# Patient Record
Sex: Female | Born: 1935 | Race: Black or African American | Hispanic: No | Marital: Married | Smoking: Former smoker
Health system: Southern US, Community
[De-identification: ages and names within clinical notes are randomized; demographics above are authoritative.]

## PROBLEM LIST (undated history)

## (undated) DIAGNOSIS — I10 Essential (primary) hypertension: Secondary | ICD-10-CM

## (undated) DIAGNOSIS — I509 Heart failure, unspecified: Secondary | ICD-10-CM

## (undated) DIAGNOSIS — I639 Cerebral infarction, unspecified: Secondary | ICD-10-CM

## (undated) DIAGNOSIS — I2510 Atherosclerotic heart disease of native coronary artery without angina pectoris: Secondary | ICD-10-CM

## (undated) HISTORY — PX: ROTATOR CUFF REPAIR: SHX139

## (undated) HISTORY — PX: TOTAL HIP ARTHROPLASTY: SHX124

## (undated) HISTORY — PX: OTHER SURGICAL HISTORY: SHX169

---

## 2001-02-24 ENCOUNTER — Encounter (INDEPENDENT_AMBULATORY_CARE_PROVIDER_SITE_OTHER): Payer: Self-pay | Admitting: Specialist

## 2001-02-24 ENCOUNTER — Ambulatory Visit (HOSPITAL_COMMUNITY): Admission: RE | Admit: 2001-02-24 | Discharge: 2001-02-24 | Payer: Self-pay | Admitting: Gastroenterology

## 2001-07-11 ENCOUNTER — Inpatient Hospital Stay (HOSPITAL_COMMUNITY): Admission: EM | Admit: 2001-07-11 | Discharge: 2001-07-13 | Payer: Self-pay

## 2001-09-08 ENCOUNTER — Ambulatory Visit (HOSPITAL_COMMUNITY): Admission: RE | Admit: 2001-09-08 | Discharge: 2001-09-08 | Payer: Self-pay | Admitting: Gastroenterology

## 2002-02-23 ENCOUNTER — Encounter: Payer: Self-pay | Admitting: Orthopedic Surgery

## 2002-02-23 ENCOUNTER — Encounter: Admission: RE | Admit: 2002-02-23 | Discharge: 2002-02-23 | Payer: Self-pay | Admitting: Orthopedic Surgery

## 2002-02-25 ENCOUNTER — Ambulatory Visit (HOSPITAL_BASED_OUTPATIENT_CLINIC_OR_DEPARTMENT_OTHER): Admission: RE | Admit: 2002-02-25 | Discharge: 2002-02-25 | Payer: Self-pay | Admitting: Orthopedic Surgery

## 2003-11-10 ENCOUNTER — Ambulatory Visit (HOSPITAL_COMMUNITY): Admission: RE | Admit: 2003-11-10 | Discharge: 2003-11-10 | Payer: Self-pay | Admitting: Neurology

## 2005-10-04 ENCOUNTER — Emergency Department (HOSPITAL_COMMUNITY): Admission: EM | Admit: 2005-10-04 | Discharge: 2005-10-04 | Payer: Self-pay | Admitting: Emergency Medicine

## 2005-11-08 ENCOUNTER — Encounter: Admission: RE | Admit: 2005-11-08 | Discharge: 2005-11-08 | Payer: Self-pay | Admitting: Neurology

## 2006-11-17 ENCOUNTER — Ambulatory Visit: Payer: Self-pay | Admitting: Cardiology

## 2006-11-24 ENCOUNTER — Encounter: Admission: RE | Admit: 2006-11-24 | Discharge: 2006-11-24 | Payer: Self-pay | Admitting: Internal Medicine

## 2006-11-24 ENCOUNTER — Ambulatory Visit: Payer: Self-pay

## 2006-11-30 ENCOUNTER — Ambulatory Visit: Payer: Self-pay | Admitting: Cardiology

## 2006-12-16 ENCOUNTER — Ambulatory Visit (HOSPITAL_COMMUNITY): Admission: RE | Admit: 2006-12-16 | Discharge: 2006-12-16 | Payer: Self-pay | Admitting: General Surgery

## 2007-01-03 ENCOUNTER — Inpatient Hospital Stay (HOSPITAL_COMMUNITY): Admission: EM | Admit: 2007-01-03 | Discharge: 2007-01-05 | Payer: Self-pay | Admitting: Emergency Medicine

## 2007-01-04 ENCOUNTER — Encounter (INDEPENDENT_AMBULATORY_CARE_PROVIDER_SITE_OTHER): Payer: Self-pay | Admitting: Interventional Cardiology

## 2007-01-04 ENCOUNTER — Ambulatory Visit: Payer: Self-pay | Admitting: *Deleted

## 2007-01-04 ENCOUNTER — Encounter (INDEPENDENT_AMBULATORY_CARE_PROVIDER_SITE_OTHER): Payer: Self-pay | Admitting: *Deleted

## 2007-03-24 ENCOUNTER — Encounter: Admission: RE | Admit: 2007-03-24 | Discharge: 2007-03-24 | Payer: Self-pay | Admitting: Interventional Radiology

## 2007-09-24 IMAGING — CR DG CHEST 2V
2 series · 2 of 2 positions shown · non-contrast
Comparison: 10/04/05.

CLINICAL DATA: Pre-op for umbilical hernia.
 CHEST - 2 VIEW:

[view not recorded (1 of 2)]
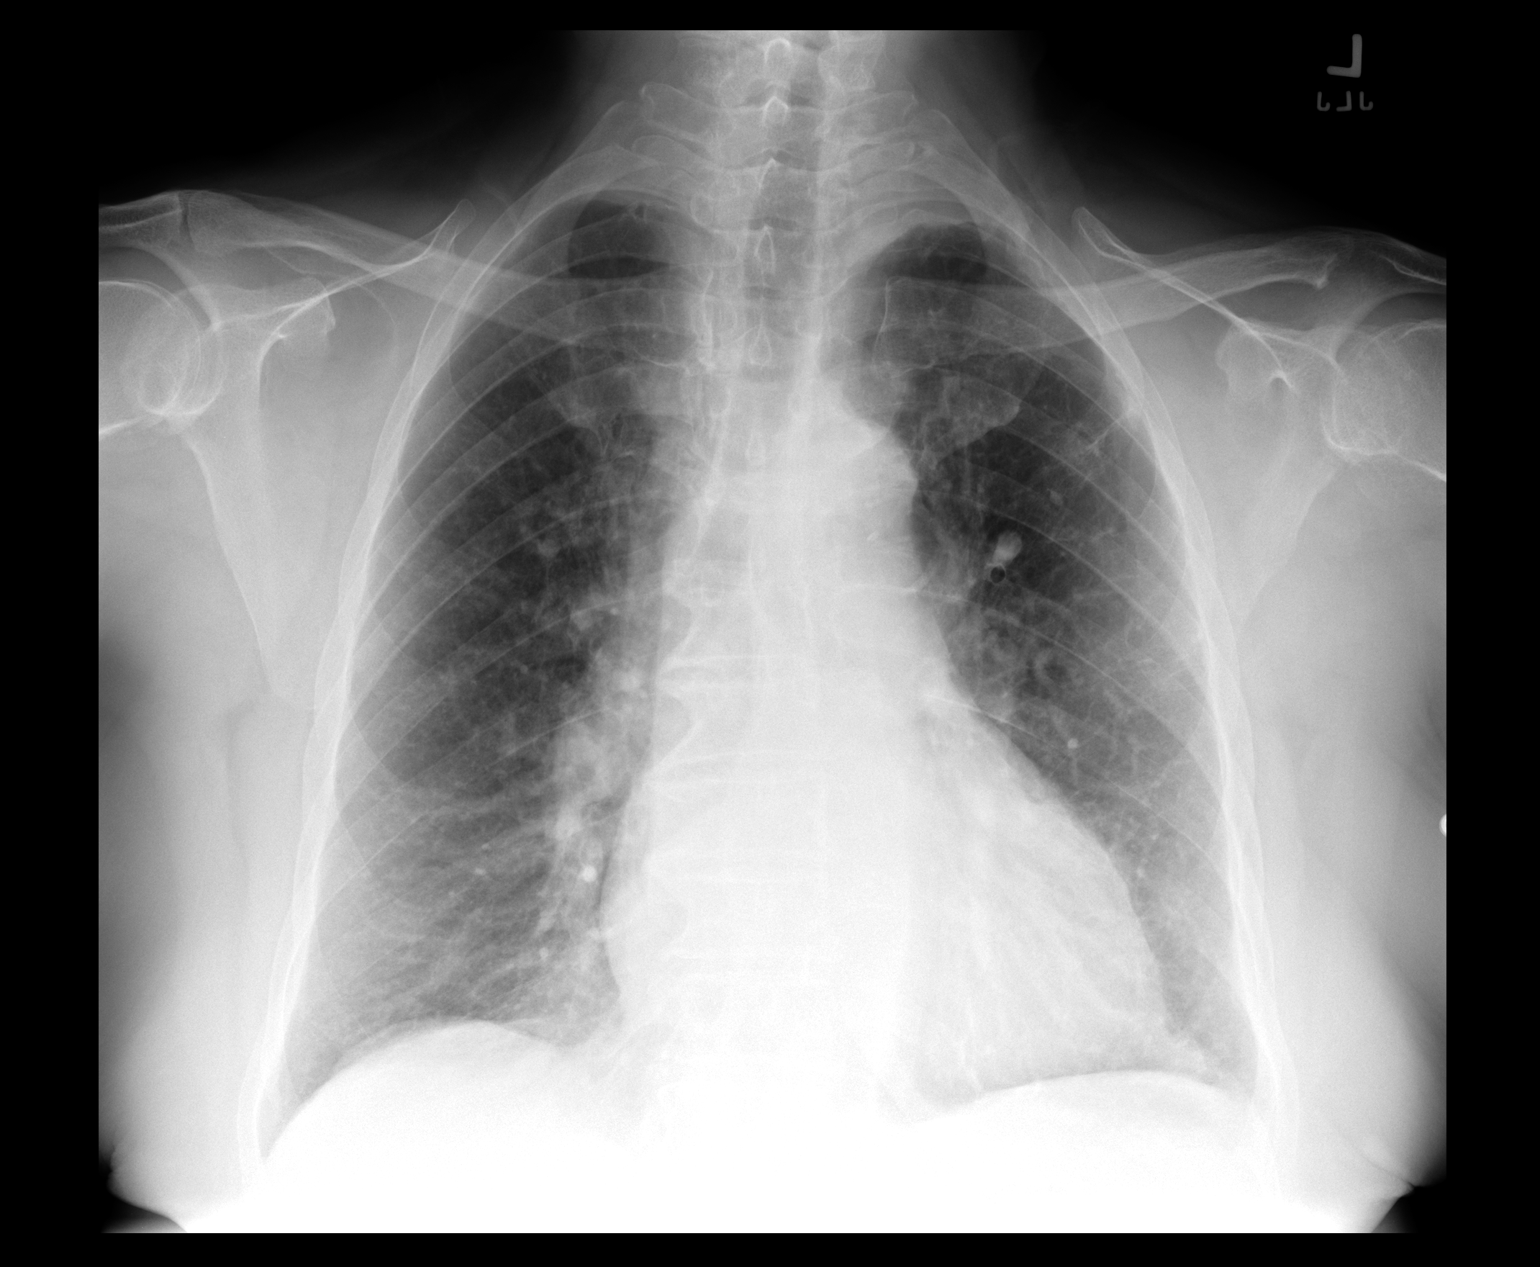

[view not recorded (2 of 2)]
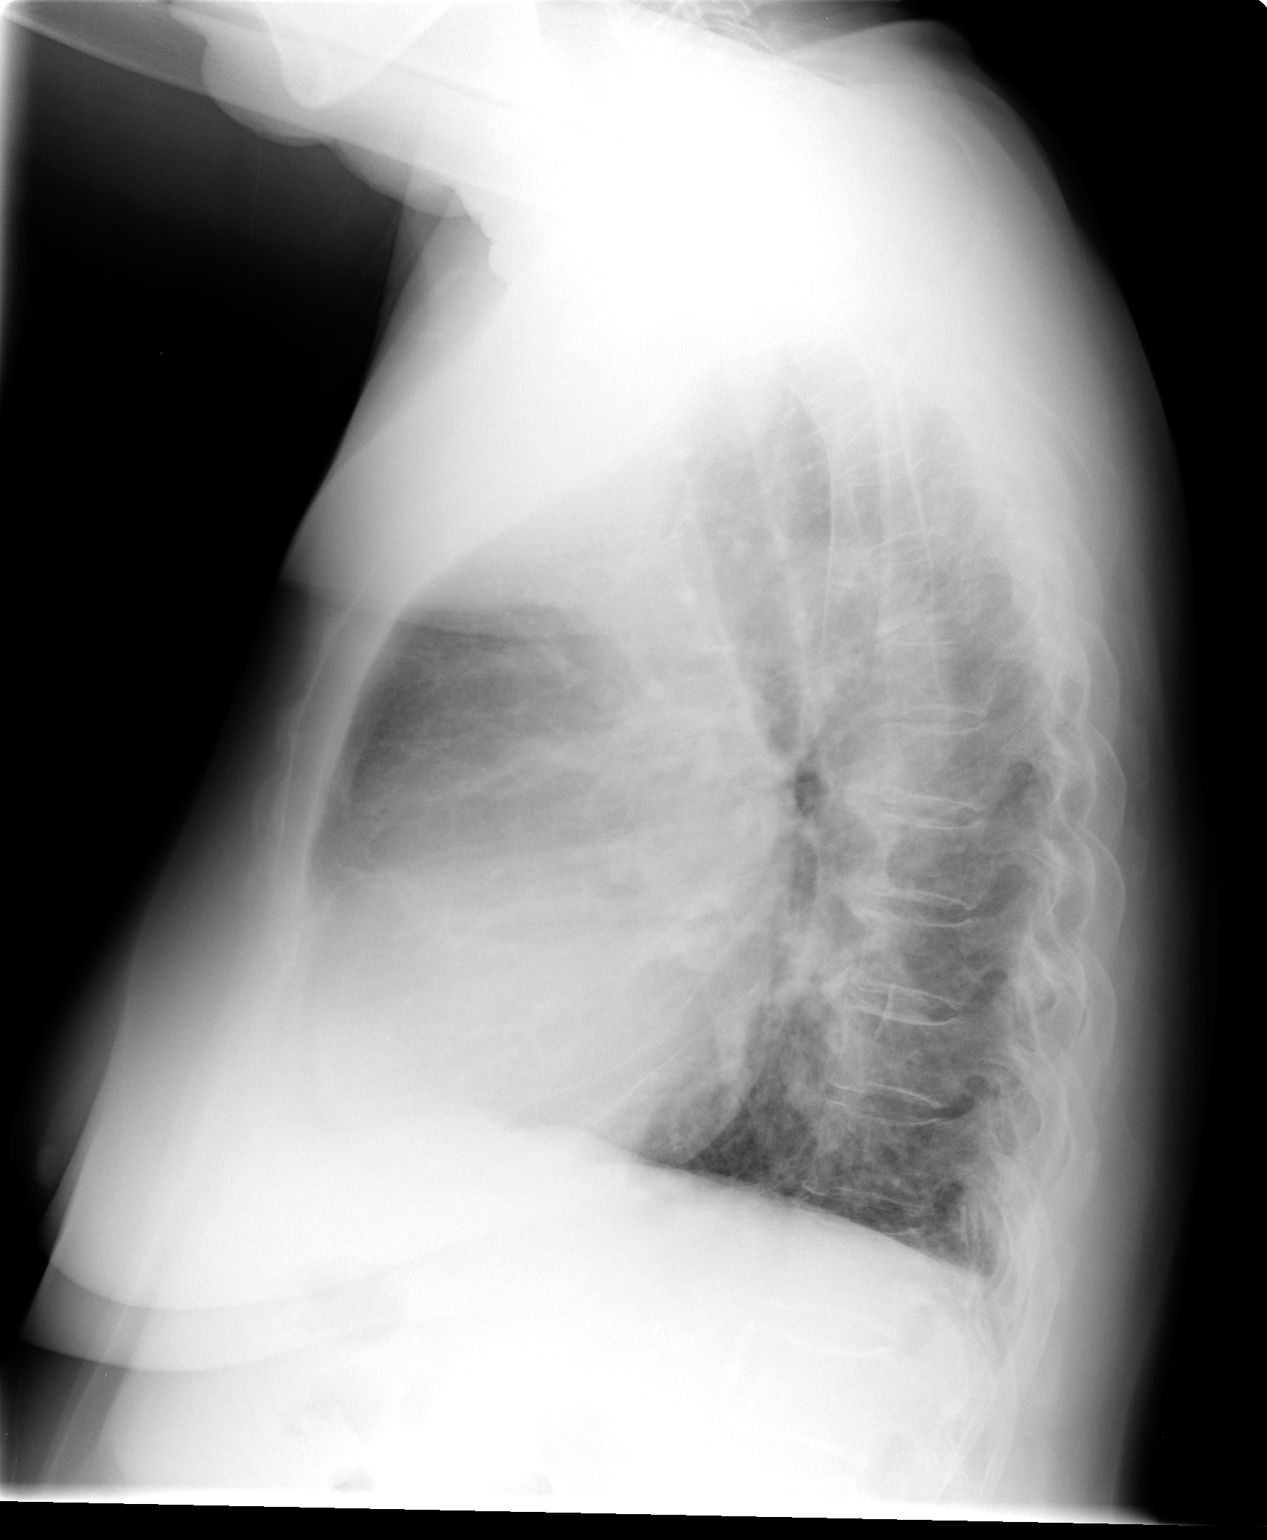

[2 of 2 positions shown; findings below may reference images not displayed]

FINDINGS: The heart is mildly enlarged without change.  There is no congestive heart failure.  There is COPD and peribronchial thickening.  No acute process.
 Osseous structures are intact with rather prominent degenerative spurring of the thoracic spine.
IMPRESSION: 1.  Mild cardiomegaly.
 2.    Peribronchial thickening and COPD.
 3.  No active disease.

## 2007-10-13 IMAGING — CR DG CHEST 2V
2 series · 2 of 2 positions shown · non-contrast
Comparison: none

HISTORY: Left arm numbness, stroke

CHEST 2 VIEWS:
Comparison 12/15/2006
Cardiac enlargement.
Stable mediastinal contours and pulmonary vascularity.
Question small hiatal hernia.
Chronic bronchitic and interstitial changes, stable.
No acute infiltrate, pleural effusion, or pneumothorax.

[w chest pa]
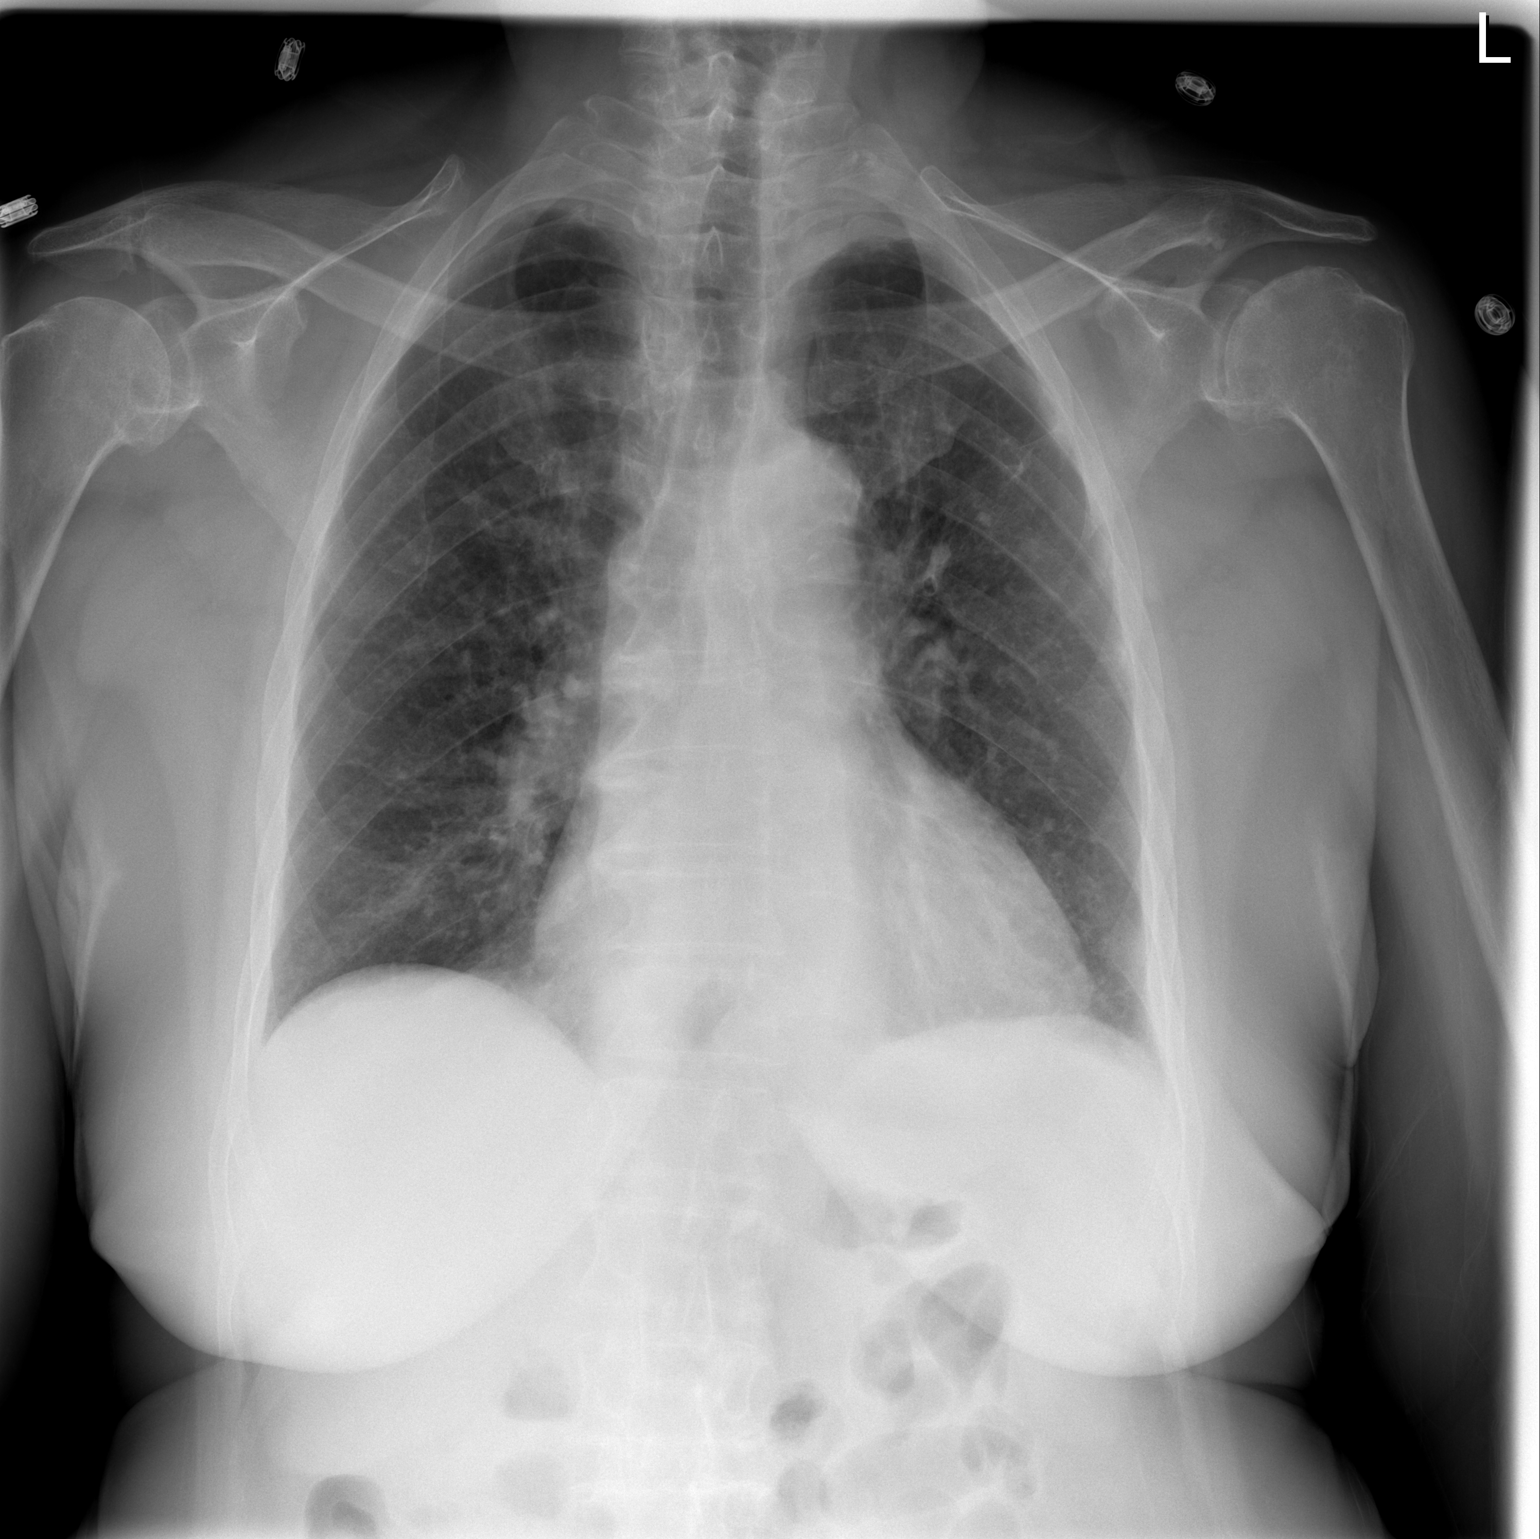

[w chest lat]
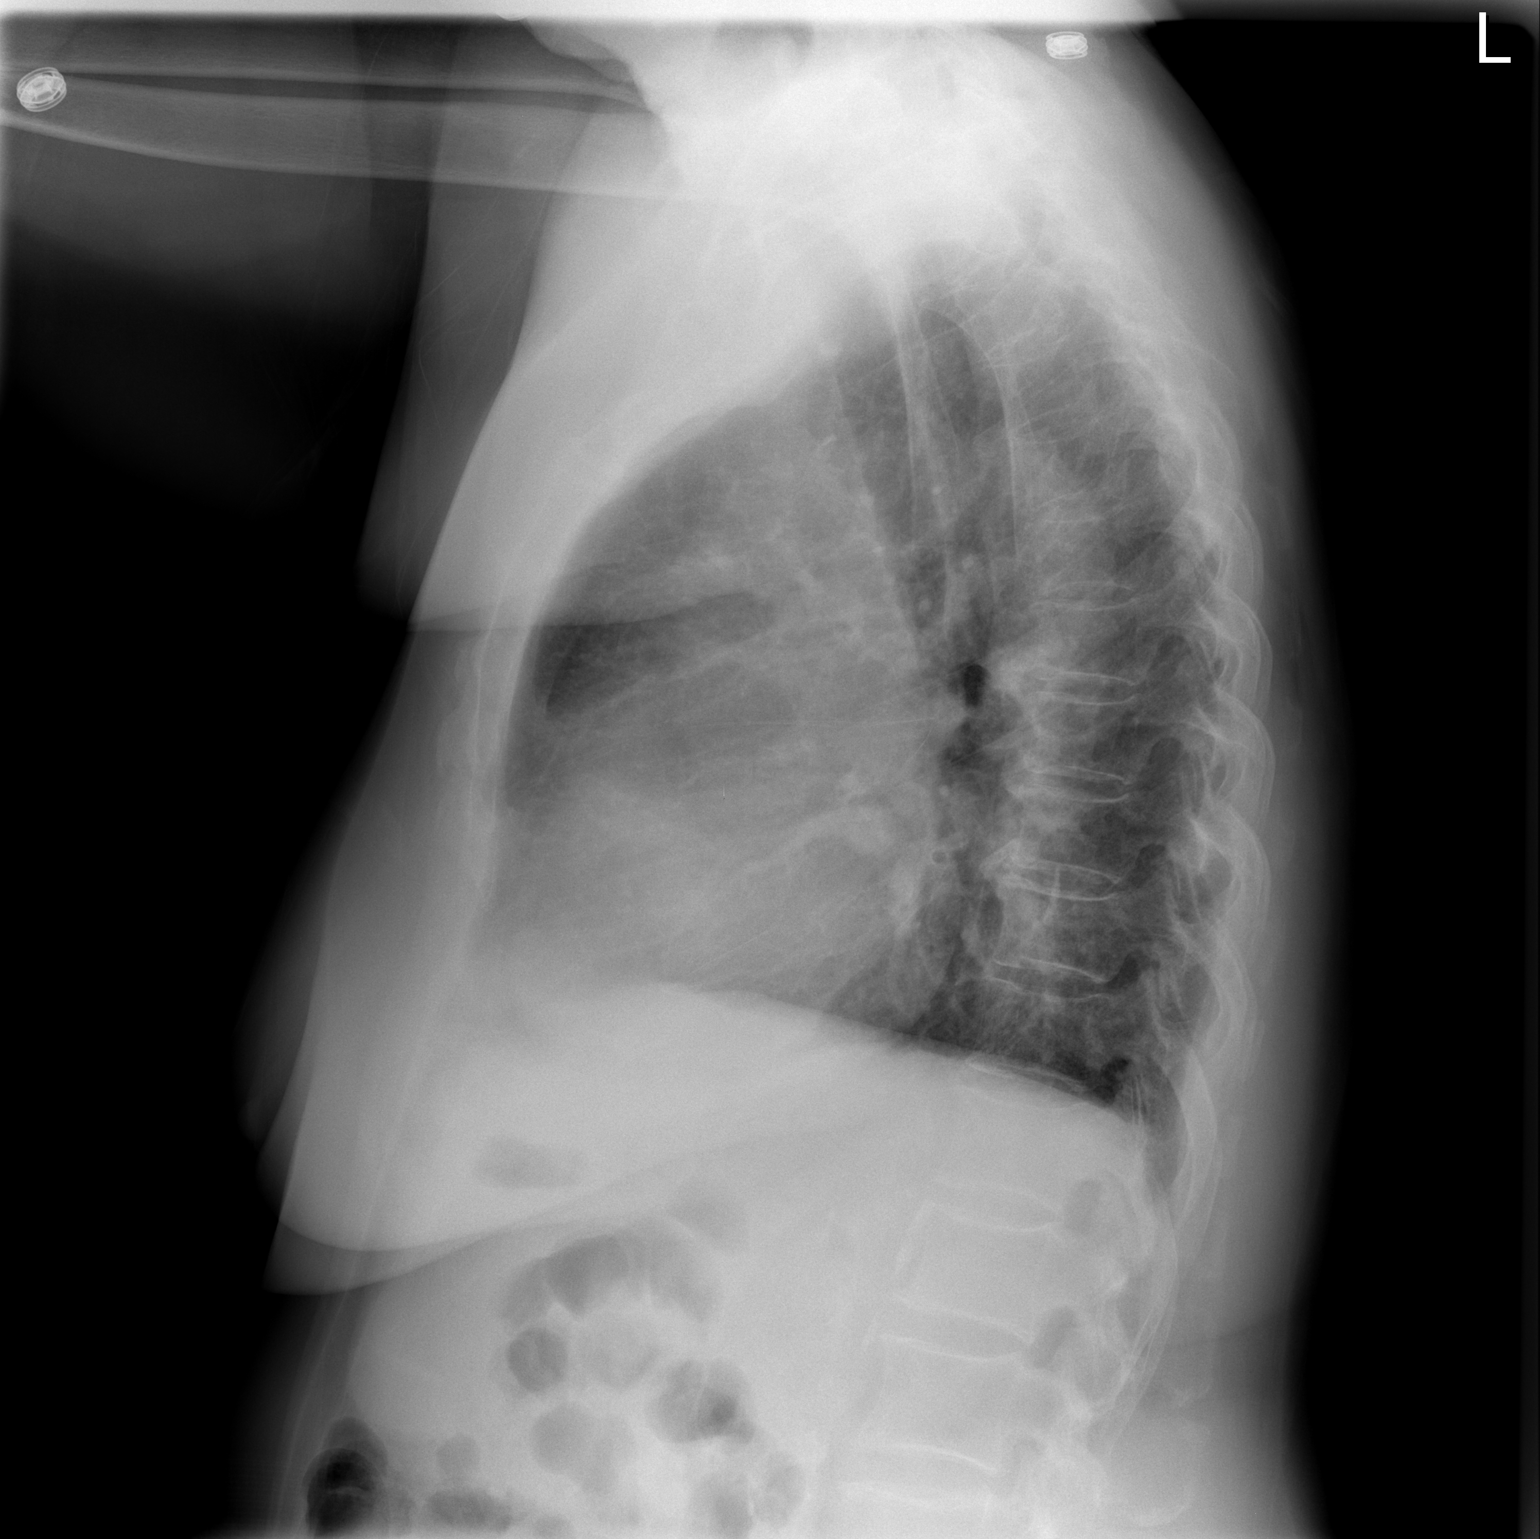

[2 of 2 positions shown; findings below may reference images not displayed]

IMPRESSION: Cardiomegaly.
Mild chronic bronchitic and interstitial changes.
No acute abnormality.
Question tiny hiatal hernia.

## 2008-06-19 ENCOUNTER — Ambulatory Visit: Payer: Self-pay | Admitting: Cardiology

## 2008-07-12 ENCOUNTER — Ambulatory Visit: Payer: Self-pay | Admitting: Cardiovascular Disease

## 2008-07-12 ENCOUNTER — Inpatient Hospital Stay (HOSPITAL_COMMUNITY): Admission: RE | Admit: 2008-07-12 | Discharge: 2008-07-20 | Payer: Self-pay | Admitting: Orthopedic Surgery

## 2008-07-14 ENCOUNTER — Encounter (INDEPENDENT_AMBULATORY_CARE_PROVIDER_SITE_OTHER): Payer: Self-pay | Admitting: Orthopedic Surgery

## 2009-04-30 ENCOUNTER — Inpatient Hospital Stay (HOSPITAL_COMMUNITY): Admission: RE | Admit: 2009-04-30 | Discharge: 2009-05-03 | Payer: Self-pay | Admitting: Orthopedic Surgery

## 2009-08-21 ENCOUNTER — Ambulatory Visit (HOSPITAL_COMMUNITY): Admission: RE | Admit: 2009-08-21 | Discharge: 2009-08-21 | Payer: Self-pay | Admitting: Orthopedic Surgery

## 2009-12-05 ENCOUNTER — Encounter: Admission: RE | Admit: 2009-12-05 | Discharge: 2009-12-05 | Payer: Self-pay | Admitting: Orthopedic Surgery

## 2010-03-07 ENCOUNTER — Ambulatory Visit (HOSPITAL_COMMUNITY): Admission: RE | Admit: 2010-03-07 | Discharge: 2010-03-07 | Payer: Self-pay | Admitting: Orthopedic Surgery

## 2010-07-30 ENCOUNTER — Emergency Department (HOSPITAL_COMMUNITY)
Admission: EM | Admit: 2010-07-30 | Discharge: 2010-07-30 | Payer: Self-pay | Source: Home / Self Care | Admitting: Emergency Medicine

## 2010-07-30 ENCOUNTER — Ambulatory Visit: Payer: Self-pay | Admitting: Vascular Surgery

## 2010-07-30 ENCOUNTER — Encounter (INDEPENDENT_AMBULATORY_CARE_PROVIDER_SITE_OTHER): Payer: Self-pay | Admitting: Emergency Medicine

## 2010-11-10 ENCOUNTER — Encounter: Payer: Self-pay | Admitting: Interventional Radiology

## 2011-01-01 LAB — POCT I-STAT, CHEM 8
BUN: 8 mg/dL (ref 6–23)
Calcium, Ion: 1.17 mmol/L (ref 1.12–1.32)
Creatinine, Ser: 0.8 mg/dL (ref 0.4–1.2)
TCO2: 28 mmol/L (ref 0–100)

## 2011-01-01 LAB — DIFFERENTIAL
Eosinophils Absolute: 0.3 10*3/uL (ref 0.0–0.7)
Lymphs Abs: 1.1 10*3/uL (ref 0.7–4.0)
Monocytes Absolute: 0.6 10*3/uL (ref 0.1–1.0)
Neutro Abs: 2.4 10*3/uL (ref 1.7–7.7)
Neutrophils Relative %: 54 % (ref 43–77)

## 2011-01-01 LAB — CBC
Hemoglobin: 11.7 g/dL — ABNORMAL LOW (ref 12.0–15.0)
MCHC: 31.5 g/dL (ref 30.0–36.0)
RDW: 13.3 % (ref 11.5–15.5)

## 2011-01-26 LAB — BASIC METABOLIC PANEL
BUN: 19 mg/dL (ref 6–23)
BUN: 9 mg/dL (ref 6–23)
CO2: 24 mEq/L (ref 19–32)
CO2: 28 mEq/L (ref 19–32)
Calcium: 8.6 mg/dL (ref 8.4–10.5)
Calcium: 9.2 mg/dL (ref 8.4–10.5)
Creatinine, Ser: 0.93 mg/dL (ref 0.4–1.2)
Creatinine, Ser: 0.96 mg/dL (ref 0.4–1.2)
GFR calc Af Amer: >60 mL/min (ref >60)
GFR calc Af Amer: >60 mL/min (ref >60)
Glucose, Bld: 108 mg/dL — ABNORMAL HIGH (ref 70–99)

## 2011-01-26 LAB — URINALYSIS, ROUTINE W REFLEX MICROSCOPIC
Bilirubin Urine: NEGATIVE
Hgb urine dipstick: NEGATIVE
Ketones, ur: NEGATIVE mg/dL
Nitrite: NEGATIVE
Urobilinogen, UA: 1 mg/dL (ref 0.0–1.0)

## 2011-01-26 LAB — GLUCOSE, CAPILLARY

## 2011-01-26 LAB — CBC
HCT: 26.9 % — ABNORMAL LOW (ref 36.0–46.0)
Hemoglobin: 9.2 g/dL — ABNORMAL LOW (ref 12.0–15.0)
Hemoglobin: 9.8 g/dL — ABNORMAL LOW (ref 12.0–15.0)
MCHC: 32.3 g/dL (ref 30.0–36.0)
MCHC: 33.6 g/dL (ref 30.0–36.0)
MCHC: 34 g/dL (ref 30.0–36.0)
MCHC: 34.2 g/dL (ref 30.0–36.0)
MCV: 88.7 fL (ref 78.0–100.0)
MCV: 90.7 fL (ref 78.0–100.0)
Platelets: 215 10*3/uL (ref 150–400)
Platelets: 218 10*3/uL (ref 150–400)
Platelets: 264 10*3/uL (ref 150–400)
RBC: 3.03 MIL/uL — ABNORMAL LOW (ref 3.87–5.11)
RBC: 3.55 MIL/uL — ABNORMAL LOW (ref 3.87–5.11)
RDW: 13.8 % (ref 11.5–15.5)
RDW: 13.8 % (ref 11.5–15.5)
RDW: 13.9 % (ref 11.5–15.5)
WBC: 8.3 10*3/uL (ref 4.0–10.5)

## 2011-01-26 LAB — PROTIME-INR
INR: 0.9 (ref 0.00–1.49)
INR: 1 (ref 0.00–1.49)
INR: 1.6 — ABNORMAL HIGH (ref 0.00–1.49)
Prothrombin Time: 13.3 seconds (ref 11.6–15.2)
Prothrombin Time: 20.2 seconds — ABNORMAL HIGH (ref 11.6–15.2)

## 2011-01-26 LAB — TYPE AND SCREEN
ABO/RH(D): A POS
Antibody Screen: NEGATIVE

## 2011-01-26 LAB — DIFFERENTIAL
Basophils Absolute: 0.1 10*3/uL (ref 0.0–0.1)
Basophils Relative: 2 % — ABNORMAL HIGH (ref 0–1)
Neutro Abs: 1.8 10*3/uL (ref 1.7–7.7)
Neutrophils Relative %: 45 % (ref 43–77)

## 2011-01-26 LAB — APTT: aPTT: 26 seconds (ref 24–37)

## 2011-01-26 LAB — URINE MICROSCOPIC-ADD ON

## 2011-03-04 NOTE — Consult Note (Signed)
NAMEJENY, NIELD NO.:  [id number]   MEDICAL RECORD NO.:  [id number]          PATIENT TYPE:  INP   LOCATION:  5033                         FACILITY:  [facility]   PHYSICIAN:  Verne Carrow, MDDATE OF BIRTH:  [birth year]   DATE OF CONSULTATION:  DATE OF DISCHARGE:                                 CONSULTATION   PRIMARY CARDIOLOGIST:  Rollene Rotunda, MD, [facility]   PRIMARY CARE Mikeila Burgen:  Gabriel Earing, MD   REQUESTING PHYSICIAN:  Feliberto Gottron. Turner Daniels, MD   PATIENT PROFILE:  A 77 year old African American female with prior  history of nonobstructive CAD, normal LV function, and normal Myoview in  February 2008, was status post left total hip arthroplasty and  complained of shortness of breath.   PROBLEMS:  1. Nonobstructive coronary artery disease.      a.     July 13, 2001, cardiac catheterization left main 20%.       LAD 50-60% proximal.  D1 20%.  Ramus intermedius 20%.  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  RCA dominant with the 20-30% proximal and mid       stenosis.      b.     November 24, 2006 adenosine Myoview EF 63%, no ischemia or       infarct.      c.     January 04, 2007, 2-D echocardiogram EF 55-60%, mild MR.  2. Right brain transient ischemic attack, March 2008.  3. Hypertension.  4. Hyperlipidemia.  5. Osteoarthritis/degenerative joint disease.      a.     Status post shoulder surgery, May 2003.      b.     Status post left total hip arthroplasty, July 12, 2008.  6. Gastroesophageal reflux disease.  7. Remote tobacco abuse.  8. Status post incarcerated umbilical hernia repair, March 2008.   HISTORY OF PRESENT ILLNESS:  A 77 year old [place] American female with  history of nonobstructive CAD and history as outlined above.  She was  admitted to [facility] yesterday for left total hip arthroplasty  following a prolonged history of left hip pain recalcitrant to  conservative  therapy.  By RN report, the patient was mildly tachycardiac  by pulse oximetry last night with normal O2 saturations, though the only  documented heart rates in the chart are in the 80s.  The patient  apparently complained of shortness of breath this morning and for that  reason we are consulted.  However, she now firmly denies ever being or  complaining of shortness of breath.  She also denies any chest pain.  She is very groggy and frequently nodding off to sleep saying that she  did not sleep at all or sleep well last night and now just tired.  Her  ECG now shows sinus rhythm without acute changes and despite her now  denying dyspnea, her chest x-ray shows increasing vascular congestion  and pulmonary edema.   ALLERGIES:  TETRACYCLINE.   CURRENT MEDICATIONS:  1. Ancef 1 g IV q.6  h.  2. Colace 100 mg b.i.d.  3. Lovenox 40 mg subcu b.i.d.  4. Lasix 40 mg daily.  5. Cozaar 50 mg daily.  6. Lopressor 25 mg b.i.d.  7. Senna 1 tab b.i.d.  8. Simvastatin 40 mg daily.  9. Coumadin as directed.   FAMILY HISTORY:  No family history of early CAD.   SOCIAL HISTORY:  She lives in [place] with her husband.  She is  retired.  She has one grown son.  She has a history of remote tobacco  abuse, but quit in 1985.  She denies alcohol or drug and is not  exercising.   REVIEW OF SYSTEMS:  She had a low grade fever this morning at 99.23.  She is very tired and groggy reporting that she did not sleep all last  night.  She complained of shortness of breath to the Orthopedic Team  this morning, although currently denies this.  She has left leg/hip  pain.  She has a history of GERD.  Otherwise all systems reviewed  negative.   PHYSICAL EXAMINATION:  VITAL SIGNS:  Temperature 99.3, heart rate 80,  respirations 18, blood pressure 125/64, pulse ox 94% on 2 liters.  GENERAL:  Pleasant African American female in no acute distress, awake,  alert, and oriented x3, although she is frequently dosing  off.  HEENT:  Normal.  NEURO:  Grossly intact, nonfocal.  SKIN:  Warm and dry with dressing to the left hip that is dry and  intact.  NECK:  JVP of about 8 cm, no bruits.  LUNGS:  Respirations are unlabored.  Diminished breath sounds, although  she is not taking deep breath.  CARDIAC:  Regular S1 and S2, no S3, S4, murmurs.  ABDOMEN:  Round, soft, nontender, nondistended.  Bowel sounds present.  EXTREMITIES:  Warm, dry, no clubbing, cyanosis.  There is trace  bilateral lower extremity edema.  Distal pulses are 2+ and equal  bilaterally.   Chest x-ray from July 05, 2008, shows cardiomegaly with pulmonary  venous hypertension.  COPD.  Old granulomatous disease.  Chest x-ray  today shows increased vascular congestion, clubbing, edema.  EKG shows  sinus rhythm with the normal axis rate of 81 beats per minute, no acute  ST or T changes.  Hemoglobin 10.2, hematocrit 30.8, WBC 0.8, platelets  of 238, sodium 137, potassium 2.6, chloride 106, CO2 23, BUN 8,  creatinine 0.82, glucose 123, and calcium 8.5.   ASSESSMENT:  1. Dyspnea.  The patient now denies any history of dyspnea, although      it is documented in her chart that she complained of this earlier.      Notably, she was on a Dilaudid PCA earlier and may have just      forgotten.  Regardless for ECG shows no acute changes while her      chest x-ray does show pulmonary edema.  We will give one dose of IV      Lasix of 40 mg now.  I would plan to continue her home dose of p.o.      Lasix.  Follow-up 2-D echocardiogram while she is in the hospital      and also check if cardiac markers.  Her heart rate nad blood      pressure are currently well controlled..  2. History of nonobstructive coronary artery disease.  No chest pain.      Dyspnea as above.  Continue home meds, which include beta-blocker,      ARB, and  statin therapy.  3. Hypertension, stable.  4. Hyperlipidemia, continue statin.  5. Degenerative joint disease, status  post left total hip arthroplasty      per Ortho.  6. Anemia.  Follow.  Question of this may contribute to dyspnea.      Nicolasa Ducking, ANP      Verne Carrow, MD  Electronically Signed    CB/MEDQ  D:  07/13/2008  T:  07/14/2008  Job:  [id number]

## 2011-03-04 NOTE — Op Note (Signed)
[name], [name] NO.:  [id number]   MEDICAL RECORD NO.:  [id number]          PATIENT TYPE:  INP   LOCATION:  5003                         FACILITY:  [facility]   PHYSICIAN:  Feliberto Gottron. Turner Daniels, M.D.   DATE OF BIRTH:  09/06/1933   DATE OF PROCEDURE:  04/30/2009  DATE OF DISCHARGE:                               OPERATIVE REPORT   PREOPERATIVE DIAGNOSIS:  End-stage arthritis, right hip.   POSTOPERATIVE DIAGNOSIS:  End-stage arthritis, right hip.   PROCEDURE:  Hybrid right total hip arthroplasty using DePuy 54-mm  pinnacle cup, 36-mm metal liner, NK +1.5, 36 ultimate head, #3 Summit  basic stem, 13-mm tip, #4 cement restrictor, double batch of DePuy HV  cement with 1500 mg of Zinacef.   SURGEON:  Feliberto Gottron. Turner Daniels, MD   FIRST ASSISTANT:  Shirl Harris, PA-C   ANESTHETIC:  General endotracheal.   ESTIMATED BLOOD LOSS:  300 mL   FLUID REPLACEMENT:  1500 mL of crystalloid.   DRAINS PLACED:  None.   TOURNIQUET TIME:  None.   INDICATIONS FOR PROCEDURE:  A 77 year old woman with end-stage arthritis  of the right hip had a successful hybrid left total hip last year and  desires same for the right side.  Risks and benefits of surgery are well  known to her and she has failed conservative measures.   DESCRIPTION OF PROCEDURE:  The patient was identified by armband and  received 2 g of Ancef preoperatively IV in the holding area, taken to  the operating room #4.  Appropriate anesthetic monitors were attached  and general endotracheal anesthesia was induced with the patient supine  in the position.  Foley catheter was inserted, rolled into the left  lateral decubitus position, fixed her with Trecia Rogers II pelvic clamp  and the right lower extremity was prepped and draped in the usual  sterile fashion from the ankle to the hemipelvis.  Skin along the  lateral hip and thigh was infiltrated with 20 mL of 0.5% Marcaine and  epinephrine solution, and a standard  time-out procedure was then  performed.  We began the operation itself by making a 20-cm incision  centered over the greater trochanter allowing a posterolateral approach  to the hip joint.  Small bleeders in the skin and subcutaneous tissue  were identified and cauterized.  IT band was cut in line with skin  incision exposing the greater trochanter.  Cobra retractor was placed  between the gluteus minimus and superior hip joint capsule and between  the quadratus femoris and the inferior hip joint capsule isolating the  short external rotators and piriformis which were tagged with #2  Ethibond suture and cut off their insertion on intertrochanteric crest  exposing the posterior aspect of the hip joint capsule which was  developed into an acetabular-based flap starting almost straight  superior off the acetabulum going out over the femoral neck down and  exiting posterior and inferior and the flap was tagged with two #2  Ethibond sutures.  This allowed [place] to dislocate the femoral head.  Standard neck cut was performed 1 fingerbreadth above the lesser  trochanter with an oscillating saw and the proximal femur was then  translated anteriorly with a Homan retractor levering off the anterior  column.  A large wing retractor was placed at the junction of the  ischium and the acetabulum and finally a spike Cobra in the cotyloid  notch.  This allowed [place] to remove the labrum and also calcified labrum,  getting [place] back to the true acetabulum which was then sequentially  reamed up to a 53-mm basket reamer obtaining good coverage in all  quadrants and the edge was lightly touched with a 54-mm reamer.  The  acetabulum was then irrigated out with normal saline solution and a 54-  mm pinnacle cup was then hammered into place in 45 degrees of abduction  and about 15-20 degrees of anteversion and the central occluder was  placed followed by the 36-mm metal liner.  The hip was then flexed  internally and  rotated exposing the proximal femur which was entered  with the initiator drill followed by the lateral reamer and followed by  the tapered reamers up to a 4.5 tapered reamer and broaches up to a #3  Summit basic broach which seated at the level of the neck cut and no  further neck cut was required.  A trial was then assembled with an NK  +1.5 neck and a 36 trial ball.  The hip was then relocated, stability to  90 flexion and 75 internal rotation was noted into the full extension  and external rotation, though could not be dislocated and the shuck test  was negative.  At this point, the trial components were removed.  We  sized for a #4 cement restrictor which was placed at the appropriate  depth.  The proximal femur was pulse lavaged out with normal saline  solution, dried with suction and sponges.  A double batch of DePuy HV  cement with 1500 mg of Zinacef was mixed at the back table and injected  into the femoral canal under pressure followed by a #3 Summit basic stem  with a 13-mm tip in about 15-20 degrees of anteversion.  This was held  in place as the cement cured and then an NK +1.5 mm, 36-mm ball was  hammered onto the stem.  The hip was reduced.  Stability was checked and  found to be excellent.  The short external rotators and capsular flap  were repaired back to the intertrochanteric crest through drill holes  with a #2 Ethibond suture.  The wound was irrigated out with normal  saline solution and pulse lavage.  The IT band was closed with running  #1 Vicryl suture, subcutaneous tissue with 0 and 2-0 undyed Vicryl  suture, and the skin with skin staples.  A dressing of Xeroform and  Mepilex was then applied.  The patient was unclamped, rolled supine,  awakened, and taken to the recovery room without difficulty.      Feliberto Gottron. Turner Daniels, M.D.  Electronically Signed     FJR/MEDQ  D:  04/30/2009  T:  04/30/2009  Job:  [id number]

## 2011-03-04 NOTE — Discharge Summary (Signed)
[name], [name] NO.:  [id number]   MEDICAL RECORD NO.:  [id number]          PATIENT TYPE:  INP   LOCATION:  5003                         FACILITY:  [facility]   PHYSICIAN:  Feliberto Gottron. Turner Daniels, M.D.   DATE OF BIRTH:  09/18/1933   DATE OF ADMISSION:  04/30/2009  DATE OF DISCHARGE:  05/03/2009                               DISCHARGE SUMMARY   CHIEF COMPLAINT:  Right hip pain.   HISTORY OF PRESENT ILLNESS:  This is a 77 year old lady who complains of  severe unremitting pain in her right hip despite conservative treatment  with antiinflammatories, narcotic pain medicines, and activity  modification.  She has a history of a successful total hip arthroplasty  on the left side in 2009.  She desires similar surgical intervention on  the contralateral side.  All risks and benefits of surgery were  discussed with the patient.   PAST MEDICAL HISTORY:  Significant for cerebrovascular accident,  coronary artery disease, and osteoarthritis.   PAST SURGICAL HISTORY:  She had a left total hip arthroplasty in 2009,  had umbilical hernia repair, and shoulder arthroscopy.   SOCIAL HISTORY:  She denies the use of alcohol or tobacco.   FAMILY HISTORY:  Significant for coronary artery disease, hypertension,  diabetes, and gout.   CURRENT MEDICATIONS:  1. Aggrenox 200/25 mg 1 p.o. b.i.d.  2. Metoprolol 25 mg 1 p.o. b.i.d.  3. Furosemide 40 mg 1 p.o. b.i.d.  4. Simvastatin 40 mg 1 p.o. daily.  5. Losartan 50 mg 1 p.o. daily.  6. Hydrocodone 5/500 mg 1 p.o. b.i.d. p.r.n. pain.   ALLERGIES:  She has allergy to TETRACYCLINES.   PHYSICAL EXAMINATION:  Gross examination of the right hip demonstrates  the patient to have significant pain with attempted internal rotation.  She has a negative foot tap, and she is neurovascularly intact.  She  ambulates with an antalgic gait.   X-rays demonstrate bone-on-bone degenerative joint disease of the right  hip.   PREOPERATIVE LABORATORY  DATA:  White blood cells 4.1, red blood cells  3.98, hemoglobin 12.1, hematocrit 35.6, platelets 264.  PT 12.5, INR is  0.9, PTT 26.  Sodium 141, potassium 4.3, chloride 107, glucose 108, BUN  19, creatinine 0.96.  Urinalysis positive for trace leukocytes,  otherwise within normal limits.   HOSPITAL COURSE:  [name] was admitted to [facility] on April 30, 2009, when she underwent a right total hip arthroplasty.  The procedure  was performed by Dr. Gean Birchwood, and the patient tolerated it well.  A  perioperative Foley catheter was placed.  She was transferred from  recovery to the orthopedic floor and placed on Lovenox and Coumadin for  DVT prophylaxis.  On the first postoperative day, the patient was awake  and alert and reported that her hip pain was well controlled with her  PCA.  She denied any nausea or vomiting and was tolerating p.o. intake  well.  Surgical dressing was clean.  Hemoglobin was 10.4.  She was  evaluated by physical therapy and ambulated approximately 30 feet.  On  the second postoperative day,  the patient was eating well.  Her pain was  well controlled with oral medication.  Surgical dressing was one-fourth  soaked and her incision was benign.  She ambulated 60 feet with physical  therapy.  On the third postoperative day, the patient was awake, alert,  eating well, and progressing well with physical therapy.  Hemoglobin was  9.2.  There was a small amount of drainage on her surgical dressing and  this was changed.  She was discharged home.   DISPOSITION:  The patient was discharged home on May 03, 2009.  She was  weightbearing as tolerated and would return to the clinic to see Dr.  Turner Daniels in 7-10 days for x-rays and suture removal.  [facility]  will manage her wound, Coumadin, and physical therapy.   Discharge medicines will be as per the HMR with the addition of Percocet  5 mg 1-2 tablets p.o. q.4 h. p.r.n. pain and Coumadin to take as  directed with  target INR of 1.5 to 2.   FINAL DIAGNOSIS:  End-stage degenerative joint disease of the right hip.      Shirl Harris, PA      Feliberto Gottron. Turner Daniels, M.D.  Electronically Signed    JW/MEDQ  D:  05/03/2009  T:  05/03/2009  Job:  [id number]

## 2011-03-04 NOTE — Discharge Summary (Signed)
Kristine, Mcclain NO.:  [id number]   MEDICAL RECORD NO.:  [id number]          PATIENT TYPE:  INP   LOCATION:  5023                         FACILITY:  [facility]   PHYSICIAN:  Feliberto Gottron. Turner Daniels, M.D.   DATE OF BIRTH:  Jun 21, 1933   DATE OF ADMISSION:  07/12/2008  DATE OF DISCHARGE:  07/20/2008                               DISCHARGE SUMMARY   CHIEF COMPLAINT:  Left hip pain.   HISTORY OF PRESENT ILLNESS:  This is a 77 year old lady who complains of  unremitting pain in her left hip who has failed conservative treatment  with NSAIDs, narcotics and steroid injections.  She desires a surgical  intervention at this time.  All risks and benefits of surgery were  discussed with the patient.   PAST MEDICAL HISTORY:  Significant for coronary artery disease,  hypertension, hyperlipidemia and gastroesophageal reflux disease.   PAST SURGICAL HISTORY:  She has had a hysterectomy and umbilical hernia  repair and cataract surgery.   SOCIAL HISTORY:  She quit smoking in 1985.  She does not drink alcohol.  She lives at home with her husband.   FAMILY HISTORY:  Noncontributory.   ALLERGIES:  She has a known drug allergy of TETRACYCLINE.   CURRENT MEDICATIONS:  1. NitroQuick 0.4 mg p.r.n. chest pain.  2. Simvastatin 40 mg one p.o. daily.  3. Aggrenox 20/25 mg one p.o. b.i.d.  4. Furosemide 40 mg one p.o. daily.  5. Acetaminophen 500 mg one p.o. q.6 hours p.r.n. pain.  6. Cozaar 50 mg one p.o. daily.  7. Hydrocodone 5/500 mg one to two tabs p.o. q.4 hours p.r.n. pain.  8. Metoprolol 25 mg one p.o. b.i.d.   PHYSICAL EXAMINATION:  Gross examination of the left hip demonstrates  the patient to have tenderness with internal rotation.  She was  neurovascularly intact.  She walks with an antalgic gait.  X-rays  demonstrate bone on bone degenerative joint disease of the left hip with  flattening of the femoral head.   Preop lab, white blood cells 4.1, red blood cells 3.98,  hemoglobin 11.8,  hematocrit 34.8, platelets 297, PT 13.0, INR 1.0, PTT 27.  Sodium 141,  potassium 4.4, chloride 109, glucose 100, BUN 10, creatinine 0.72.  Her  urinalysis was significant for trace leukocytes but was otherwise within  normal limits.   HOSPITAL COURSE:  [name] was admitted to [facility] on July 12, 2008, where she underwent a left total hip arthroplasty using the  DePuy system with a cemented stem.  Dr. Gean Birchwood performed the  procedure.  An intraoperative Foley catheter was placed.  She tolerated  the procedure well and was transferred to the orthopedic floor.  On the  first postoperative day the patient was awake but drowsy, complaining of  shortness of breath on 2 liters of oxygen via nasal cannula.  The nurse  reported intermittent episodes of tachycardia during the night.  Her  hemoglobin was 10.2.  Her surgical dressing was found to be clean and  dry.  Her cardiologist was contacted for evaluation of dyspnea.  On the  second postoperative  day the patient reported improvement in her  shortness of breath but she did have an episode of near syncope when she  was working with physical therapy.  Her hemoglobin on this day was 10.8.  Her surgical dressing was again clean and dry and her incision was found  to be benign.  The patient had a syncopal episode when she worked with  physical therapy again on the second postoperative day so the  cardiologist was consulted to evaluate episodes of syncope.  The  cardiologist felt the episodes were not likely ischemic but probably  more from episodes of orthostatic hypotension.  On the third  postoperative day the patient's hemoglobin was 9.9.  She was not able to  work well with physical therapy secondary to complaints of fatigue.  She  also continued to complain of hypotensive episodes when standing.  On  the fourth postoperative day the patient was moved to a telemetry bed so  that she could be monitored more  closely.  She continued to have  difficulty working with physical therapy.  Her hemoglobin on this day  was 9.6.  Her surgical incision remained benign.  On the fifth  postoperative day the patient reported minimal hip pain.  She was taking  p.o. well and denied any nausea or vomiting.  She continued to progress  very slowly with physical therapy and had another episode of what the  physical therapist described as syncope.  On the sixth postoperative day  the patient's pain medicine was reduced.  She was awake and alert and  denied any chest pain or shortness of breath.  Skilled nursing was  consulted for the patient to be rehabbed after her discharge from the  hospital.  On the seventh postoperative day the patient complained of  fatigue but she denied any episodes of syncope or near syncope the  previous day when she worked with physical therapy.  There was a small  amount of clear serous drainage on her surgical dressing so this was  changed.  She was able to ambulate 16 feet with physical therapy.  On  the eighth postoperative day the patient was awake, alert.  She denied  any nausea or vomiting.  She was eating well and reported that she had a  bowel movement the previous day.  She denied any episodes of syncope  over the last couple of days.  Her incision appeared benign so she was  discharged to a skilled nursing facility.   DISPOSITION:  The patient was discharged to a skilled nursing facility  on July 20, 2008.  The facility will manage her Percocet, Coumadin,  her other home medications, physical therapy and her surgical wound.  She will be weightbearing as tolerated with physical therapy and will  return to the clinic to see Dr. Turner Daniels in 1 week.  Medicines will be as  per the EMR with the addition of Percocet 5 mg tab 1-2 tabs p.o. q.4  hours p.r.n. pain and Coumadin 5 mg tablets take as directed with a  target INR of 1.5-2.  We have also advised her to continue following  up  with her cardiologist regarding her episodes of hypotension and near  syncope.   FINAL DIAGNOSIS:  End-stage degenerative joint disease of the left hip.      Shirl Harris, PA      Feliberto Gottron. Turner Daniels, M.D.  Electronically Signed    JW/MEDQ  D:  07/20/2008  T:  07/20/2008  Job:  [id number]

## 2011-03-04 NOTE — Op Note (Signed)
NAMELIBERTI, APPLETON NO.:  [id number]   MEDICAL RECORD NO.:  [id number]          PATIENT TYPE:  INP   LOCATION:  2899                         FACILITY:  [facility]   PHYSICIAN:  Feliberto Gottron. Turner Daniels, M.D.   DATE OF BIRTH:  11-Mar-1933   DATE OF PROCEDURE:  07/12/2008  DATE OF DISCHARGE:                               OPERATIVE REPORT   PREOPERATIVE DIAGNOSIS:  End-stage arthritis, left hip.   POSTOPERATIVE DIAGNOSIS:  End-stage arthritis, left hip.   PROCEDURE:  Hybrid left total hip arthroplasty using a 52-mm DePuy ASR  cup, NK +2 46-mm Ultimate ball #4 Summit basic stem of the 13 tip, and a  #4 cement restrictor double batch of DePuy HV cement with 1500 mg  Zinacef.   SURGEON:  Feliberto Gottron.  Turner Daniels, MD   FIRST ASSISTANT:  Shirl Harris, PA-C   ANESTHESIA:  General endotracheal.   ESTIMATED BLOOD LOSS:  400 mL.   FLUID REPLACEMENT:  1600 mL of crystalloid.   DRAINS PLACED:  Foley catheter.   URINE OUTPUT:  300 mL.   INDICATIONS FOR PROCEDURE:  A 77 year old woman with bone-on-bone  arthritic changes by x-ray and severe unremitting pain that prevents  ambulation more than about 20-30 feet before she must seek support,  wakes her up at night, and she has failed conservative treatment, anti-  inflammatory medicines, attempts at physical therapy, and judicious use  of narcotics.  In order to decrease pain and increase function, she  desires left total hip arthroplasty.  Risks and benefits of surgery  discussed with using a cemented stem to minimize blood loss and get her  up moving as quickly as possible in this 77 year old woman who is  physiologically 75.   DESCRIPTION OF PROCEDURE:  The patient was identified by armband and  received IV antibiotics preoperatively.  She was then taken to the  operating room 1.  The appropriate anesthetic site monitors were  attached and general endotracheal anesthesia induced with the patient in  supine position.  Foley catheter  was inserted.  She was then rolled into  the right lateral decubitus position and fixed there with a Stulberg  Mark II pelvic clamp to keep the pelvis vertical.  The left lower  extremity was then prepped and draped in the usual sterile fashion from  the ankle to the hemipelvis and the skin along the lateral hip and thigh  infiltrated with 20 mL with 0.5% Marcaine and epinephrine solution.  We  began the procedure itself by making a 20-cm incision centered over the  greater trochanter cutting through the skin and subcutaneous tissue down  to the level of the IT band, which was then cut in line with the skin  incision.  Small bleeders were again identified and cauterized.  A Cobra  retractor was placed between the gluteus minimus and the superior hip  joint capsule and between the quadratus femoris and the inferior joint  capsule.  This isolated short external rotators and piriformis which  were then tagged with a #2 Ethibond suture and cut off their insertion  on the intertrochanteric crest.  The  posterior aspect of the capsule was  then developed into an acetabular based flap and tagged with #2 Ethibond  sutures going from posterior-superior and the acetabulum to its  insertion on the femoral neck, down the femoral neck, and then out  posteroinferiorly on the acetabulum.  The hip was then flexed,  internally rotated dislocating the arthritic femoral head which was then  cut 1 fingerbreadth above the lesser trochanter with a standard neck cut  using the power saw.  This exposed the acetabulum which did have bone-on-  bone arthritic changes.  The proximal femur was translated anteriorly  levering off the anterior column with a Hohmann retractor.  A Cobra  retractor was placed in the ossified cotyloid notch and a posterior  inferior wing retractor was placed at the junction of the ischium and  the acetabulum.  Peripheral osteophytes were removed with a rongeur.  We  then reamed up to a 51-mm  basket reamer obtaining a good fit and fill in  all quadrants and hammered into place a 52-mm ASR cup in 45 degrees of  abduction and about 20 degrees of anteversion.  Satisfied with the  position of the cup, it was flexed and internally rotated exposing the  proximal femur which was entered with the initiating reamer followed by  the lateral reamer and then the conical reamers up to a 6-7 conical  reamer were then broached up to a #4 broach which had the correct fit  and filled proximally and no extra neck cut was required.  With the  broach in place, we performed a trial reduction with a standard neck and  NK+2 46-mm trial ball.  After the hip was reduced it came to full  extension with the knee flexed to 120 degrees.  She could flex to 90 and  internally rotate to almost 80 degrees before any instability was noted.  At this point, the trial components were removed.  We sized for #4  cement restrictor which was inserted.  The proximal femur was then pulse  lavaged, cleaned, and dried with suction sponges.  A double batch of  DePuy HV cement was mixed with 1500 mg of Zinacef and injected into the  proximal femur under pressure using the gun followed by the #4 Summit  stem and about 20-30 degrees of anteversion in relation to the  epicondylar axis.  Holding the stem in place, the cement was allowed to  cure.  Once it had cured, an NK+2 46-mm Ultimate ball was hammered on  the stem.  The hip was again reduced and stability in leg length noted  to be excellent.  The wound was irrigated out with normal saline  solution.  No significant bleeding was noted.  The IT band was closed  with running #1 Vicryl suture.  The subcutaneous tissue with 0 and 2-0  undyed Vicryl suture and the skin with skin staples.  A dressing of  Xeroform and Mepilex was then applied.  The patient was unclamped,  rolled supine, awakened, and taken to the recovery room without  difficulty.      Feliberto Gottron. Turner Daniels, M.D.   Electronically Signed     FJR/MEDQ  D:  07/12/2008  T:  07/13/2008  Job:  [id number]

## 2011-03-04 NOTE — Assessment & Plan Note (Signed)
[facility]                            CARDIOLOGY OFFICE NOTE   YANIRA, TOLSMA                     MRN:          [id number]  DATE:06/19/2008                            DOB:          Jul 23, 1933    PRIMARY CARE PHYSICIAN:  Gabriel Earing, MD   ORTHOPEDIST:  Feliberto Gottron. Turner Daniels, MD   REASON FOR PRESENTATION:  Preoperative evaluation of the patient with  nonobstructive coronary artery disease and hypertension.   HISTORY OF PRESENT ILLNESS:  The patient is a lovely 77 year old African  American female with a history of nonobstructive coronary artery disease  as described below.  She is going to have hip surgery.  She is sent for  preoperative clearance.  I last saw her in February 2008 for  preoperative clearance prior to a umbilical hernia repair.  She did fine  with this procedure.  She was last evaluated at that time with a stress  perfusion study, which demonstrated an EF of 63% without evidence of  ischemia or infarct.   Since that time, the patient has had no new cardiovascular complaints.  She has had known shortness of breath and denies PND or orthopnea.  She  has had no chest discomfort, neck, or arm discomfort.  She has had no  palpitations, presyncope, or syncope.  She can vacuum in her house and  take care of a 77-year-old.  She does not otherwise exercise.  Her level  of activity is greater than 5 METS.   PAST MEDICAL HISTORY:  1. Coronary artery disease (September 2002, left main 20% stenosis,      LAD 50-60% stenosis, intermediate 20% stenosis, first diagonal 20%      stenosis, right coronary artery proximal and mid 20% and 30%      stenosis.  EF of 60%.)  2. Degenerative joint disease.  3. Gastroesophageal reflux disease.  4. Hypertension.  5. Hyperlipidemia.   PAST SURGICAL HISTORY:  Hysterectomy, umbilical hernia repair, bone  spurs, and cataract surgery.   ALLERGIES/INTOLERANCES:  TETRACYCLINE.   MEDICATIONS:  1. Cozaar 50  mg daily.  2. Pravastatin 40 mg daily.  3. Furosemide 40 mg daily.  4. Toprol 25 mg b.i.d.  5. Aggrenox 200/25 b.i.d.   SOCIAL HISTORY:  The patient is married.  She has 1 son.  No  grandchildren.  She quit smoking in 1985.   FAMILY HISTORY:  Noncontributory for early coronary artery disease.   REVIEW OF SYSTEMS:  As stated in the HPI and positive for reflux.  Negative for all other systems.   PHYSICAL EXAMINATION:  GENERAL:  The patient is in no distress.  VITAL SIGNS:  Blood pressure 132/67, heart rate 48 and regular, weight  203 pounds, and body mass index 34.  HEENT:  Eyes unremarkable; pupils equal, round, and reactive to light;  fundi not visualized; oral mucosa unremarkable.  NECK:  No jugular venous distension at 45 degrees; carotid upstroke  brisk and symmetrical; no bruits, no thyromegaly.  LYMPHATICS:  No cervical, axillary, or inguinal adenopathy.  LUNGS:  Clear to auscultation bilaterally.  BACK:  No costovertebral angle tenderness.  CHEST:  Unremarkable.  HEART:  PMI not displaced or sustained; S1 and S2 within normal limits;  no S3, no S4; no clicks, no rubs, no murmurs.  ABDOMEN:  Obese; positive bowel sounds, normal in frequency and pitch;  no bruits, no rebound, no guarding; no midline pulsatile mass, no  hepatomegaly, no splenomegaly.  SKIN:  No rashes, no nodules.  EXTREMITIES:  2+ pulses throughout; no edema, no cyanosis, no clubbing.  NEURO:  Oriented to person, place, and time; cranial nerves II through  XII grossly intact; motor grossly intact.   EKG sinus bradycardia, rate 48, axis within normal limits, intervals  within normal limits, no acute ST-T wave changes.   ASSESSMENT AND PLAN:  1. Preoperative evaluation.  The patient is preoperative for hip      surgery.  She had negative stress perfusion study in February 2008.      She has had no new symptoms since then.  She has moderate      functional level (greater than 5 METS).  Given all of this  and      according to ACC/AHA guidelines, no further cardiovascular stenting      is suggested.  I do note that she has got sinus bradycardia and she      is asymptomatic.  Of course, she will need to have this monitored      during her surgery.  2. Hypertension.  Blood pressure is controlled.  She will continue      medications as listed.  If she does not tolerate the beta-blocker,      she may need to have a higher dose of Cozaar for blood pressure      control.  3. Obesity.  Hopefully, she will be able to exercise more and can lose      weight with diet and exercise when she is post hip replacement.  4. Nonobstructive coronary artery disease.  As above.  She should      continue with secondary risk reduction.  5. Dyslipidemia.  The patient should have an LDL less than 100 and HDL      greater than 100      ideally.  I will defer to Dr. Andi Devon.  6. Followup.  I will see the patient back as needed.     Rollene Rotunda, MD, [facility]  Electronically Signed    JH/MedQ  DD: 06/19/2008  DT: 06/20/2008  Job #: [id number]   cc:   Gabriel Earing, M.D.  Feliberto Gottron. Turner Daniels, M.D.

## 2011-03-07 NOTE — Assessment & Plan Note (Signed)
[facility]                            CARDIOLOGY OFFICE NOTE   [name], [name]                     MRN:          [id number]  DATE:11/30/2006                            DOB:          07-21-1933    PRIMARY:  Dr. Gabriel Earing.   REASON FOR PRESENTATION:  Evaluate patient with hypertension and  nonobstructive coronary disease.   HISTORY OF PRESENT ILLNESS:  The patient presents for follow up of the  above.  She is 77 years old.  She was last seen in this office in late  January.  She was seen for preoperative evaluation prior to umbilical  hernia surgery.  She did have a stress perfusion study.  I was happy to  see that this demonstrated no evidence of ischemia or infarct.  The EF  was 63%.  She now returns for follow up of this.  Her blood pressure has  been elevated.  She reports she takes it at home and it is often, or  usually, in the sytolic 160's with diastolics in the 80's.  She denies  any new cardiac symptoms.  She does not have any substernal chest  pressure or neck discomfort.  She is not having any arm discomfort  consistent with angina.  She has no activity-induced nausea, vomiting or  diaphoresis.  She has no palpitations, presyncope or syncope.  She  denies any shortness of breath, PND or orthopnea.   PAST MEDICAL HISTORY:  1. Coronary artery disease (September, 2002 left main 20% stenosis,      LAD 50-60% stenosis, intermediate 20% stenosis, first diagonal 20%      stenosis, right coronary artery proximal to mid 20-30% stenosis.      The EF was 60%.), well preserved ejection fraction, degenerative      joint disease, gastroesophageal reflux disease, hypertension,      hyperlipidemia.   ALLERGIES:  TETRACYCLINE.   MEDICATIONS:  1. Cozaar 50 mg daily.  2. Pravastatin 40 mg daily.  3. Furosemide 40 mg daily.  4. Aspirin 81 mg daily.   REVIEW OF SYSTEMS:  As stated in the HPI and otherwise negative for  other systems.   PHYSICAL EXAMINATION:  The patient is in no distress.  Blood pressure  162/94, heart rate 70 and regular, weight 202 pounds, body mass index  33.  HEENT:  Eyes unremarkable, pupils equal, round, react to light, fundi  not visualized, oral mucosa unremarkable.  NECK:  No jugular venous distention at 45 degrees, carotid upstroke  brisk and symmetric, no bruits, no thyromegaly.  LYMPHATICS:  No cervical, axillary or inguinal adenopathy.  LUNGS:  Clear to auscultation bilaterally.  BACK:  No costovertebral angle tenderness.  CHEST:  Unremarkable.  HEART:  PMI not displaced or sustained.  S1 and S2 within normal limits,  no S3, no S4, no clicks, no rubs, no murmurs.  ABDOMEN:  Flat, positive bowel sounds normal in frequency and pitch, no  bruits, no rebound, no guarding or midline pulse or mass, no  hepatomegaly, no splenomegaly.  SKIN:  No rashes, no nodules.  EXTREMITIES:  Pulses 2+ throughout, no  edema, no cyanosis, no clubbing.  NEURO:  Oriented to person, place and time, cranial nerves II-XII  grossly intact, motor grossly intact.   ASSESSMENT AND PLAN:  1. Preoperative clearance.  The patient is at acceptable risk for the      planned procedure.  She had negative stress perfusion study.  No      further cardiovascular testing is suggested.  She should continue      on medications as listed.  There will be one change as described      below.  2. Hypertension.  Her blood pressure is not at target according to her      home readings and here.  Therefore, I am going to add Toprol-XL 50      mg daily.  This will also have the added advantage of reducing her      perioperative cardiovascular risk.  We discussed the possible side      effects.  The patient will let me know if she has any problems with      this.  3. Followup.  I would like to see her back in 12 months or sooner if      needed.  She will let me know home blood pressure readings,      however.     Rollene Rotunda, MD,  [facility]  Electronically Signed    JH/MedQ  DD: 11/30/2006  DT: 11/30/2006  Job #: [id number]   cc:   Angelia Mould. Derrell Lolling, M.D.  Gabriel Earing, M.D.

## 2011-03-07 NOTE — Procedures (Signed)
[place]. [facility]  Patient:    [name], [name]                       MRN: [id number] Proc. Date: 02/24/01 Adm. Date:  [id number] Attending:  Charna Elizabeth CC:         Laban Emperor. Cloward, M.D., [facility].   Procedure Report  DATE OF BIRTH:  12/25/1932.  PROCEDURE:  Esophagogastroduodenoscopy with biopsies.  ENDOSCOPIST:  Anselmo Rod, M.D.  INSTRUMENT USED:  Olympus video panendoscope.  INDICATION FOR PROCEDURE:  Epigastric pain in a 77 year old African-American female.  Rule out peptic ulcer disease, esophagitis, gastritis, etc.  PREPROCEDURE PREPARATION:  Informed consent was procured from the patient. The patient was fasted for eight hours prior to the procedure.  PREPROCEDURE PHYSICAL:  VITAL SIGNS:  The patient had stable vital signs.  NECK:  Supple.  CHEST:  Clear to auscultation.  S1, S2 regular.  ABDOMEN:  Soft with normal bowel sounds.  DESCRIPTION OF PROCEDURE:  The patient was placed in the left lateral decubitus position and sedated with 50 mg of Demerol and 5 mg of Versed intravenously.  Once the patient was adequately sedate and maintained on low-flow oxygen and continuous cardiac monitoring, the Olympus video panendoscope was advanced through the mouthpiece, over the tongue, into the esophagus under direct vision.  The entire esophagus appeared normal without evidence of ring, stricture, masses, lesions, esophagitis, or Barretts mucosa.  The scope was then passed into the stomach.  It passed into the GE junction with slight difficulty.  There seemed to be a slight stricturing at the GE junction; however, the scope was passed through this area with a minimal amount of heme after the passage of the scope.  The entire gastric mucosa appeared healthy except for some heaped-up mucosa around the antrum. The mucosa was heaped-up.  A small hiatal hernia was seen on high retroflexion.  Multiple antral biopsies  were done.  The proximal small bowel appeared normal.  IMPRESSION: 1. Normal-appearing esophagus and proximal small bowel. 2. Small hiatal hernia. 3. Heaped-up mucosa in the antrum, biopsies done. 4. Slight difficulty passing scope through the gastroesophageal junction,    question minimal stricturing.  RECOMMENDATIONS: 1. Await pathology results. 2. Proceed with colonoscopy at this time. 3. Outpatient follow-up in the office in the next four weeks. DD:  02/24/01 TD:  02/25/01 Job: [id number]

## 2011-03-07 NOTE — Procedures (Signed)
[place]. [facility]  Patient:    [name], [name]                       MRN: [id number] Proc. Date: 02/24/01 Adm. Date:  [id number] Attending:  Charna Elizabeth CC:         Laban Emperor. Cloward, M.D., [facility].   Procedure Report  DATE OF BIRTH:  11/23/1932.  PROCEDURE:  Colonoscopy with hot biopsies x 2.  ENDOSCOPIST:  Anselmo Rod, M.D.  INSTRUMENT USED:  Olympus video colonoscope.  INDICATION FOR PROCEDURE:  Screening colonoscopy being performed in a 77 year old African-American female.  Rule out colonic polyps, masses, hemorrhoids, etc.  PREPROCEDURE PREPARATION:  Informed consent was procured from the patient. The patient was fasted for eight hours prior to the procedure and prepped with a bottle of magnesium citrate and a gallon of NuLytely the night prior to the procedure.  PREPROCEDURE PHYSICAL:  VITAL SIGNS:  The patient had stable vital signs.  NECK:  Supple.  CHEST:  Clear to auscultation.  S1, S2 regular.  ABDOMEN:  Soft with normal bowel sounds.  DESCRIPTION OF PROCEDURE:  The patient was placed in the left lateral decubitus position and sedated with an additional 20 mg of Demerol and 2 mg of Versed intravenously.  Once the patient was adequately sedate and maintained on low-flow oxygen and continuous cardiac monitoring, the Olympus video colonoscope was advanced from the rectum to the cecum without difficulty. There was evidence of pandiverticular disease with several large diverticula on both sides throughout the colon.  A small polyp was hot biopsied x 2 at 20 cm.  No other abnormalities were seen.  IMPRESSION: 1. Small colonic polyp at 20 cm, removed with hot biopsy forceps. 2. Pandiverticulosis.  RECOMMENDATIONS: 1. Await pathology results. 2. Increase fluid and fiber in the diet. 3. Avoid nuts, seeds, and popcorn. 4. Outpatient follow-up in the next four weeks. DD:  02/24/01 TD:  02/25/01 Job:  [id number]

## 2011-03-07 NOTE — Op Note (Signed)
[place]. [facility]  Patient:    [name], [name] Visit Number: [id number] MRN: [id number]          Service Type: DSU Location: [facility] Attending Physician:  Milly Jakob Dictated by:   Harvie Junior, M.D. Proc. Date: 02/25/02 Admit Date:  02/25/2002 Discharge Date: 02/25/2002                             Operative Report  PREOPERATIVE DIAGNOSIS:  Impingement with possible rotator cuff tear.  POSTOPERATIVE DIAGNOSES: 1. Significant arthritic change, glenohumeral joint. 2. Anterolateral impingement. 3. Acromioclavicular joint arthritis.  PROCEDURES: 1. Anterolateral acromioplasty. 2. Distal clavicle excision. 3. Debridement of the glenohumeral joint, of both the glenoid of significant    glenoid wear and the humeral head.  SURGEON:  Harvie Junior, M.D.  ASSISTANT:  Currie Paris. Thedore Mins.  ANESTHESIA:  General.  BRIEF HISTORY:  She is a 77 year old female with a long history of having significant shoulder pain.  She ultimately had been followed with improvement with injection therapy but ultimately because of continued complaints of pain, she was brought to the operating room for operative arthroscopy and debridement if necessary.  DESCRIPTION OF PROCEDURE:  The patient brought to the operating room and after an adequate level of anesthesia was obtained with a general anesthetic, the patient was placed in the beach chair position and all bony prominences were well-padded.  The patient had a preoperative interscalene block.  At this point routine arthroscopic examination of the shoulder revealed that there was quite significant wear in the glenohumeral joint, most significantly on the glenoid with delaminating articular cartilage throughout the full radius of the glenoid.  This was debrided with the suction shaver within the glenohumeral joint.  Attention was then turned to the humeral head, where there was also noted to be some  delaminating, flaking articular cartilage. This was debrided from within the glenohumeral joint.  Attention was turned to the superior labrum, where a mild superior labral tear was debrided. Attention was then turned to the rotator cuff from the undersurface, which looked without evidence of full-thickness tear.  At this point the cannula was left in the glenohumeral joint and the camera was moved up into the subacromial space.  At this point the anterolateral acromioplasty was performed from the lateral portal.  The camera was then changed laterally and a cutting block technique was used to do a distal clavicle excision from the posterior portal.  Excellent resection of the distal clavicle both anteriorly and laterally was undertaken at this point, and a perfectly smooth acromion was achieved.  At this point attention was turned to the rotator cuff from above, where the rotator cuff was noted to be intact.  A significant bursectomy was then performed.  Attention was then turned to the distal clavicle, where about 18 mm of distal clavicle was resected from the anterior portal.  A measured probe was used to measure the distance.  At this point attention was turned back to the rotator cuff in the subacromial space, where a final suction shaver was used to debride all of the bony fragments and remaining bursa out of the shoulder.  The shoulder was then put through a range of motion and noted to have an excellent range of motion.  At this point the shoulder was copiously irrigated and suctioned dry, and the patient was taken to the recovery room with a sterile and compressive dressing in  place, where she was noted to be in satisfactory condition.  Estimated blood loss for the procedure was none. Dictated by:   Harvie Junior, M.D. Attending Physician:  Milly Jakob DD:  02/25/02 TD:  02/28/02 Job: [id number]

## 2011-03-07 NOTE — Op Note (Signed)
[name], [name]              ACCOUNT NO.:  [id number]   MEDICAL RECORD NO.:  [id number]          PATIENT TYPE:  AMB   LOCATION:  DAY                          FACILITY:  [facility]   PHYSICIAN:  Angelia Mould. Derrell Lolling, M.D.DATE OF BIRTH:  November 11, 1932   DATE OF PROCEDURE:  12/16/2006  DATE OF DISCHARGE:                               OPERATIVE REPORT   PREOPERATIVE DIAGNOSIS:  Incarcerated umbilical hernia.   POSTOPERATIVE DIAGNOSIS:  Incarcerated umbilical hernia.   OPERATION PERFORMED:  Repair, incarcerated umbilical hernia.   SURGEON:  Angelia Mould. Derrell Lolling, M.D.   OPERATIVE INDICATIONS:  This IS A 77 year old white female whom I saw  about 1 month ago for a symptomatic umbilical hernia.  This had been  bothering her for several weeks.  On exam, she was an older woman  weighing 204 pounds.  At her umbilicus and slightly above there was an  only partially reducible umbilical hernia.  I did not feel the defect  but I suspected it was small.  She is brought to the operating room  electively.  She has recently been treated for a urinary tract infection  with Cipro.   OPERATIVE TECHNIQUE:  Following the induction of general endotracheal  anesthesia, the patient was given an intravenous dose of Ancef.  The  patient was identified as correct procedure and correct patient.  The  abdomen was prepped and draped in a sterile fashion.  Marcaine 0.5% with  epinephrine was used as a local infiltration anesthetic.  A curved  transverse incision was made at the lower rim of the umbilicus.  Dissection was carried down through the subcutaneous tissue.  I  mobilized the umbilical skin off of the hernia sac.  I found that she  had a moderate-sized hernia sac containing a lot of preperitoneal fat  and a little bit of omentum.  This was carefully dissected out until  everything was either debrided or completely reduced within the  abdominal cavity.  I could then insert my finger onto the rim of the  hernia  defect and found no other defects.  I undermined the subcutaneous  tissue little bit.  I chose to repair this primarily with interrupted  sutures of #1 Novofil.  Simple sutures of #1 Novofil were placed beyond  the corners of the defect and three interrupted sutures of #1 Novofil  were placed centrally in a vest over pants fashion.  After all these  sutures were placed, we then lifted up on all the sutures to close the  defect and overlap the upper rim of the fascia on top of the lower rim  and then tied all sutures.  This provided an excellent secure repair and  it nicely overlapped.  The wound was irrigated with saline.  The  umbilicus was tacked back to the fascia with of 3-0 Vicryl suture.  The  subcutaneous tissue was closed with interrupted sutures of 3-0 Vicryl  and the skin closed with a running  subcuticular suture of 4-0 Monocryl and Steri-Strips.  Clean bandages  were placed and the patient was taken to the recovery room in stable  condition.  Estimated blood loss was about 10 mL or less.  Complications:  None.  Sponge, needle and instrument counts were  correct.      Angelia Mould. Derrell Lolling, M.D.  Electronically Signed     HMI/MEDQ  D:  12/16/2006  T:  12/16/2006  Job:  [id number]   cc:   Gabriel Earing, M.D.  Fax: [phone]   Rollene Rotunda, MD, [facility]  [address]  [address]  [place]  [place] [address]

## 2011-03-07 NOTE — Procedures (Signed)
[place]  Patient:    [name], [name] Visit Number: [id number] MRN: [id number]          Service Type: END Location: ENDO Attending Physician:  Charna Elizabeth Dictated by:   Anselmo Rod, M.D. Proc. Date: 09/08/01 Admit Date:  09/08/2001   CC:         Gabriel Earing, M.D.   Procedure Report  DATE OF BIRTH:  1933/01/03  REFERRING PHYSICIAN:  Gabriel Earing, M.D.  PROCEDURE PERFORMED:  Flexible sigmoidoscopy.  ENDOSCOPIST:  Anselmo Rod, M.D.  INSTRUMENT USED:  Olympus video panendoscope.  INDICATIONS FOR PROCEDURE:  Abnormal rectal exam with soft tissue fullness in a 77 year old African-American female, rule out anal masses, polyps etc.  PREPROCEDURE PREPARATION:  Informed consent was procured from the patient. The patient was fasted for eight hours prior to the procedure and prepped with two Fleets enemas the morning of the procedure.  PREPROCEDURE PHYSICAL:  The patient had stable vital signs.  Neck supple. Chest clear to auscultation.  S1, S2 regular.  Abdomen soft with normal abdominal bowel sounds.  DESCRIPTION OF PROCEDURE:  The patient was placed in the left lateral decubitus position and sedated with an additional 10 mg of Demerol and 1 mg of Versed intravenously.  Once the patient was adequately sedated and maintained on low-flow oxygen and continuous cardiac monitoring, the Olympus video colonoscope was advanced from the rectum to 40 cm without difficulty.  No masses, polyps, erosions or ulcerations were seen.  IMPRESSION:  Normal flexible sigmoidoscopy up to 40 cm.  RECOMMENDATIONS:  Outpatient follow-up. Further recommendation made after repeat rectal examination done and patient re-examined. Dictated by:   Anselmo Rod, M.D. Attending Physician:  Charna Elizabeth DD:  09/08/01 TD:  09/08/01 Job: [id number]

## 2011-03-07 NOTE — Discharge Summary (Signed)
[place]. [facility]  Patient:    [name], [name] Visit Number: [id number] MRN: [id number]          Service Type: MED Location: 2000 2039 01 Attending Physician:  Rollene Rotunda Dictated by:   Tereso Newcomer, P.A.-C. Admit Date:  07/11/2001 Disc. Date: 07/13/01   CC:         Dr. Andi Devon - [facility] - [place], [place]   Discharge Summary  DATE OF BIRTH:  October 06, 1933  REASON FOR ADMISSION:  Unstable angina.  DISCHARGE DIAGNOSES: 1. Coronary artery disease, noncritical. 2. Normal left ventricular function with an ejection fraction of 60%. 3. Hyperlipidemia. 4. Degenerative joint disease. 5. Gastroesophageal reflux disease.  PROCEDURES PERFORMED THIS ADMISSION:  Cardiac catheterization by Dr. Noralyn Pick. Nishan on July 13, 2001, revealing left main 20%, LAD proximal 50%-60% tubular stenosis, IM 20%, D-I 20%.  The circumflex has 20% proximal.  The RCA has 20%-30% proximal/mid.  The LV is normal.  Ejection fraction 60%.  HISTORY OF PRESENT ILLNESS:  This 77 year old female was admitted on July 11, 2001, with complaints of left-sided chest pain described as being cramp-like with diaphoresis.  PHYSICAL EXAMINATION:  VITAL SIGNS:  Her initial physical examination revealed a blood pressure of 132/52, pulse 68.  NECK:  Without jugular venous distention or bruits.  CHEST:  Clear.  HEART:  Regular rate and rhythm without murmurs.  Normal S1, S2.  ABDOMEN:  Nontender.  EXTREMITIES:  Without edema.  Electrocardiogram showed a normal sinus rhythm without acute changes.  Chest x-ray showed vascular congestion without edema and very large cardiac shadow.  HOSPITAL COURSE:  The patient was admitted for chest pain that seemed consistent with unstable angina.  She was placed on heparin and nitroglycerin as well as a beta blocker.  She eventually ruled out for a myocardial infarction by enzymes.  She underwent a 2-D echocardiogram secondary  to the enlarged heart noted on the chest x-ray.  This showed normal LV size and function with an ejection fraction of 55%-65%.  Left ventricular wall thickness was borderline increased, and there was mild RVH noted.  The patient underwent a cardiac catheterization on July 13, 2001.  The results are noted above.  The patient tolerated the procedure well and had no immediate complications.  It was felt she was ready for discharge home later that afternoon after her post-catheterization rest was complete.  She was noted to be bradycardic with a heart rate in the 40s and lower 50s.  Her Lopressor was discontinued prior to discharge.  At discharge she was started on Mavik 1 mg q.d.  FOLLOWUP:  She will have a BMP checked next week at our office laboratory to monitor her potassium and BUN and creatinine, as she was just started on Mavik 1 mg q.d., and she will have followup with Dr. Rollene Rotunda in one month, on August 10, 2001, at 10:15 a.m. in [place], and followup with a Persantine Cardiolite in the office in two to three months.  This can be set up at her follow-up appointment.  LABORATORY DATA:  White blood cell count 6700, hemoglobin 12.3, hematocrit 36.3, platelet count 250,000.  INR 0.8.  Sodium 144, potassium 3.8, chloride 111, CO2 of 28, glucose 118, BUN 22, creatinine 1.0.  Total bilirubin 0.5, alkaline phosphatase 65, AST 15, ALT 13, total protein 6.4, albumin 3.3, calcium 9.4.  CPK-MB negative x 3.  Troponin I negative x 2.  Total cholesterol 188.  HDL 78, triglycerides 40, LDL 102.  TSH  1.770.  DISCHARGE MEDICATIONS: 1. Coated aspirin 325 mg q.d. 2. Celebrex 200 mg b.i.d. 3. Pravachol 40 mg q.d. 4. Nitroglycerin p.r.n. chest pain. 5. Mavik 1 mg q.d.  ACTIVITIES:  No driving, heavy lifting, exertion, or work for three days.  DIET:  Low-fat, low-sodium diet.  INSTRUCTIONS:  She is to call our office in [place] if any groin swelling, bleeding, or  bruising. Dictated by:   Tereso Newcomer, P.A.-C. Attending Physician:  Rollene Rotunda DD:  07/13/01 TD:  07/13/01 Job: [id number]

## 2011-03-07 NOTE — Discharge Summary (Signed)
NAMEOMER, MONTER NO.:  [id number]   MEDICAL RECORD NO.:  [id number]          PATIENT TYPE:  INP   LOCATION:  3731                         FACILITY:  [facility]   PHYSICIAN:  Gustavus Messing. Orlin Hilding, M.D.DATE OF BIRTH:  [birth year]   DATE OF ADMISSION:  01/03/2007  DATE OF DISCHARGE:  01/05/2007                               DISCHARGE SUMMARY   DIAGNOSES AT TIME OF DISCHARGE:  1. Right brain transient ischemic attack.  2. Hypertension.  3. Headaches.  4. History of congestive heart failure.  5. Dyslipidemia.  6. Hysterectomy in 1963.  7. Incarcerated umbilical hernia repair, February 2008.   MEDICINES AT TIME OF DISCHARGE:  1. Aggrenox one p.o. nightly for 2 weeks, then increase to b.i.d.  2. Aspirin 81 mg q.a.m. for 2 weeks, then stop.  3. Tylenol two tablets 1 hour before Aggrenox dose for 1 week.  4. Lasix 40 mg a day.  5. Cozaar 50 mg a day.  6. Lopressor 50 mg a day.  7. Zantac 150 mg b.i.d.  8. Pravachol 40 mg a day.  9. Metamucil one tablespoon 3 times a day.  10.MiraLax one tablespoon a day.   STUDIES PERFORMED:  1. CT of the brain on admission shows stable atrophy, minimal chronic      small vessel white matter ischemic changes and old left occipital      infarct, no acute abnormality.  2. MRI of the brain shows no acute infarct.  Sequelae of bilateral      occipital lobe infarcts, left greater than right, with interval      evolution of encephalomalacia on the right.  Stable chronic small      vessel disease.  3. MRA of the head is unremarkable, aside from anatomic variants.  4. Chest x-ray shows cardiomegaly, mild chronic bronchitic and      interstitial changes, no acute abnormality.  Question tiny hiatal      hernia.  5. Carotid Doppler shows no ICA stenosis, vertebral arteries      antegrade, no significant change since last study, November 10, 2003.  6. Two-dimensional echocardiogram shows ejection fraction of 55% to      60% with no  embolic source.  7. EKG not present in chart.  8. Transcranial Doppler performed, results pending.   LABORATORY STUDIES:  CBC normal.  Chemistry with glucose 115, otherwise  normal.  Coagulations normal.  Liver function tests normal.  Cardiac  enzymes negative.  Cholesterol 203, triglycerides 189, HDL 37 and LDL  128.  Urinalysis showed 3-6 white blood cells, a few bacteria, many  squamous epithelials, no nitrates, small leukocyte esterase and protein  negative.  Alcohol level is less than 5.  Homocystine is 5.1.  Sed rate  is 24.  RPR is nonreactive.  Hemoglobin A1c is 6.4.  Urine drug screen  is negative.   HISTORY OF PRESENT ILLNESS:  [name] is a 77 year old right-  handed African American female who was seen in the emergency room for  evaluation of left hand and foot numbness.  She has a past history  of  hypertension for 2 years.  In the past, she has been evaluated by Dr.  Pearlean Brownie for headaches and underwent an MRI of the brain and vascular study  of the brain, the results of which are unknown, in January 2007.  She  was told that it showed multiple bicerebral strokes apparently in the  back part of her brain, most likely left greater than right occipital  infarcts.  Clinically, she has never had a stroke, but the day of  admission she awoke with left hand and food numbness, sat up from her  bed and noticed she had dizziness, nausea, right-sided headache and  bowel incontinence.  Her symptoms cleared except for some left foot  numbness and she came to emergency room for evaluation.  She was not  given tPA secondary to quick resolution.  She was admitted for further  evaluation.   HOSPITAL COURSE:  MRI was negative for acute abnormality.  She had  vascular risk factors identified of dyslipidemia, for which she was on  Pravachol at home, and elevated hemoglobin A1c of 6.4, which may suggest  diabetes.  Her symptoms are likely right brain TIA secondary to small-  vessel  disease.  She was on aspirin prior to admission and she was  changed to Aggrenox for secondary stroke prevention.  She has mild  decreased sensation on her left side that remains; all other neurologic  symptoms are back to normal.  She is slightly unsteady with physical  therapy with some dizziness.  It seems she may potentially be  orthostatic.  The patient was encouraged to drink plenty of fluids, get  up with help only, was taught orthostatic exercises and asked to follow  up with her primary care physician in 1 week.   CONDITION ON DISCHARGE:  Patient alert and oriented x3.  Speech clear.  She is able to follow 3-step commands.  Her extraocular movements are  full.  Visual fields are full.  No facial weakness.  She has good  strength in the upper extremities with no drift.  She has good strength  in lower extremities with no ataxia.  She does have mild decreased  sensation, left lower extremity.  She has no aphasia, dysarthria or  extinction.   DISCHARGE PLAN:  1. Discharge home with family.  2. Aggrenox for secondary stroke prevention.  3. Home health PT evaluation and treat as needed.  4. Follow up with Dr. Gabriel Earing, her primary care physician,      within 1 week.  5. Follow up with Dr. Delia Heady in 2-3 months.      Annie Main, N.P.      Catherine A. Orlin Hilding, M.D.  Electronically Signed    SB/MEDQ  D:  01/05/2007  T:  01/06/2007  Job:  [id number]   cc:   Pramod P. Pearlean Brownie, MD  Gabriel Earing, M.D.

## 2011-03-07 NOTE — Assessment & Plan Note (Signed)
[facility]                            CARDIOLOGY OFFICE NOTE   [name]                     MRN:          [id number]  DATE:11/17/2006                            DOB:          [birth year]    This is a very pleasant, elderly, 77 year old, married, African-American  female patient that has not been seen here since 2002.  She needs  surgical clearance for umbilical hernia repair, and recently had  elevated blood pressure and cholesterol at Dr. Augustin Schooling office.  Her  cholesterol was 302, triglycerides 250, LDL 205.  She was started on  pravastatin.  Her blood pressure in their office was 198/85, and after  resting came down to 162/98.   The patient was seen here in 2002 when she presented with chest pains.  She underwent cardiac cath by Dr. Antoine Poche, which revealed 20% left  main, 50%-60% proximal LAD, 20% intermediate branch in first diagonal,  20% circumflex and RCA.  Normal LV function.  Ejection fraction 60%,  small grade and across the aortic valve.  She then underwent adenosine  Cardiolite because of the borderline LAD lesion, and December 2002 this  was normal.  Patient states she has not had any chest pain since that  incident, has never used nitro, and, overall, has done well.  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  This child has been living with her since August.  She is  not very active, uses a rowing machine about 10 minutes a day, but  otherwise that is all.  She denies any chest pain, palpitations,  dizziness or presyncope.   CURRENT MEDICATIONS:  1. Cozaar 50 mg daily.  2. Pravastatin 40 mg daily.  3. Furosemide 40 mg daily.   PAST MEDICAL HISTORY:  She has arthritis, some reflux disease.   SOCIAL HISTORY:  She is married 52 years, has a 34 year old son, and is  currently caring for an 91-month-old baby.  She is a nonsmoker.   REVIEW OF SYSTEMS:  Negative for  dizziness or presyncopal signs or  symptoms.  She has occasional reflux symptoms.  No dysphagia, nausea,  vomiting, change in bowel, other than occasional constipation, and no  melena.  Cardiopulmonary:  See HPI.   PHYSICAL EXAMINATION:  This is a pleasant, 77 year old African-American  female, no acute distress.  Blood pressure 133/74, pulse 73, weight 204.  NECK:  Without JVD, a sharp bruit, or thyroid enlargement.  LUNGS:  Clear anterior, posterior, and lateral.  HEART:  Regular rate and rhythm at 60 beats per minute, normal S1, S2,  with 1/6 systolic ejection murmur at the left sternal border and apex.  ABDOMEN:  Soft without organomegaly, masses, lesions, or abnormal  tenderness.  EXTREMITIES:  Mild ankle edema bilaterally.  She has good distal pulses.   EKG:  Normal sinus rhythm, no acute change.   IMPRESSION:  1. Umbilical hernia, awaiting surgery.  2. Hypertension, elevated at Dr. Augustin Schooling, stable today in our      office.  3. History of chest pain in 2002 with  non obstructive coronary artery      disease on cath, and negative followup Cardiolite in December,      2002, no recent chest pain.  4. Hyperlipidemia.  Recently started on pravastatin.  5. Gastroesophageal reflux disease.  6. Arthritis.   PLAN:  At this time, after discussing with Dr. Riley Kill, we recommend an  adenosine Cardiolite with her history of the left anterior descending  lesion.  We also recommend she get a blood pressure cuff, keep track of  her blood pressures at home, and have her see Dr. Antoine Poche back in 2  weeks to see if she needs another antihypertensive.  Follow up on her  Cardiolite.      Jacolyn Reedy, PA-C  Electronically Signed      Arturo Morton. Riley Kill, MD, [facility]  Electronically Signed   ML/MedQ  DD: 11/17/2006  DT: 11/17/2006  Job #: [id number]   cc:   Gabriel Earing, M.D.  Angelia Mould. Derrell Lolling, M.D.  Rollene Rotunda, MD, [facility]

## 2011-03-07 NOTE — Procedures (Signed)
[place]. [facility]  Patient:    [name], [name] Visit Number: [id number] MRN: [id number]          Service Type: END Location: ENDO Attending Physician:  Charna Elizabeth Dictated by:   Anselmo Rod, M.D. Proc. Date: 09/08/01 Admit Date:  09/08/2001   CC:         Gabriel Earing, M.D.   Procedure Report  DATE OF BIRTH:  05-May-1933  REFERRING PHYSICIAN:  Gabriel Earing, M.D.  PROCEDURE PERFORMED:  Esophagogastroduodenoscopy.  ENDOSCOPIST:  Anselmo Rod, M.D.  INSTRUMENT USED:  Olympus video panendoscope.  INDICATIONS FOR PROCEDURE:  Epigastric pain in a 77 year old African-American female rule out peptic ulcer disease, esophagitis, gastritis, etc.  PREPROCEDURE PREPARATION:  Informed consent was procured from the patient. The patient was fasted for eight hours prior to the procedure.  PREPROCEDURE PHYSICAL:  The patient had stable vital signs.  Neck supple. Chest clear to auscultation.  S1, S2 regular.  Abdomen soft with normal bowel sounds.  DESCRIPTION OF PROCEDURE:  The patient was placed in left lateral decubitus position and sedated with 50 mg of Demerol and 5 mg of Versed intravenously. Once the patient was adequately sedated and maintained on low-flow oxygen and continuous cardiac monitoring, the Olympus video panendoscope was advanced through the mouthpiece, over the tongue, into the esophagus under direct vision.  The entire esophagus appeared normal without evidence of ring, stricture masses, lesions, esophagitis or Barretts mucosa.  The scope was then advanced to the stomach.  A small hiatal hernia was seen on high retroflexion.  There was mild antral gastritis, no ulcers were seen.  The proximal small bowel appeared normal as well.  IMPRESSION: 1. Small hiatal hernia. 2. Mild antral gastritis.  Otherwise normal esophagogastroduodenoscopy.  RECOMMENDATION:  Proceed with flexible sigmoidoscopy at this time. Dictated by:    Anselmo Rod, M.D. Attending Physician:  Charna Elizabeth DD:  09/08/01 TD:  09/08/01 Job: [id number]

## 2011-03-07 NOTE — H&P (Signed)
[name], [name] NO.:  [id number]   MEDICAL RECORD NO.:  [id number]          PATIENT TYPE:  INP   LOCATION:  3731                         FACILITY:  [facility]   PHYSICIAN:  Genene Churn. Love, M.D.    DATE OF BIRTH:  1933-05-27   DATE OF ADMISSION:  01/03/2007  DATE OF DISCHARGE:                              HISTORY & PHYSICAL   PATIENT ADDRESS:  [address]  [place], [place] [address]   This is one of several [facility] admissions for this 77-year-  old right-handed black married female from [place], [place],  admitted from the emergency room for evaluation of left hand and foot  numbness.   HISTORY OF PRESENT ILLNESS:  [name] has a past history of  hypertension for least 2 years.  She was evaluated by Dr. Pearlean Brownie,  neurologist in [place], for headaches and underwent an MRI study of  the brain and vascular study of the brain, the results of which are  unknown in January of 2007.  She was told that it showed multiple  bicerebral strokes apparently in the back part of the brain, most likely  left greater than right occipital strokes.  Clinically, she has never  had a stroke, but today she awoke with left hand and foot numbness, sat  up from her bed and noticed that she had dizziness, nausea, right-sided  headache and bowel incontinence.  Her symptoms cleared except for some  left foot numbness, and she came to the emergency room at [facility].   She does not smoke cigarettes, have a history of diabetes or have a  history of heart attack.  She does have a history of congestive heart  failure and hyperlipidemia.  Her medication has included aspirin daily.   Her past medical history is significant hypertension 2 years ago,  strokes on MRI of the brain, hysterectomy in 1963, incarcerated  umbilical hernia repair in February of 2008 and hyperlipidemia.   Her medicines include:  1. Quinine 2 times a week.  2.  Furosemide 40 mg daily.  3. Aspirin 81 mg daily.  4. Pravastatin 40 mg daily.  5. Ranitidine 150 mg daily.  6. Cozaar 50 mg daily.  7. Metoprolol 50 mg daily.  8. NitroQuick 0.4 mg p.r.n. chest pain.  9. Vicodin 5/500 one q.4h. p.r.n. for pain.   She has no known allergies.   She does not smoke cigarettes.  She does not drink alcohol.   Her social history, she finished twelfth grade in school.  She worked in  a day care center in the past.   FAMILY HISTORY:  Her mother died at 36 from a heart attack.  Her father  died at 28 of unknown causes.  She had a brother who died in his 49s  with a myocardial infarction, brother age 28 who has prostatic cancer.  She has sisters, 15 and 34, living and well.  There is a positive family  history of high blood pressure in a sister and diabetes mellitus in a  brother and a sister.   PHYSICAL EXAMINATION:  Revealed a well-developed black female.  Blood  pressure right arm 160/80, left arm 140/60. There was a right  supraclavicular bruit heard.  MENTAL STATUS:  She was alert and oriented x3, followed one, two and  three-step commands.  Cranial nerve examination revealed visual fields  full.  Disks flat.  Spontaneous venous pulsations seen.  Extraocular  movements full.  There is a questionable left inferior quadranopsia.  Corneals present.  No seventh nerve palsy.  Tongue midline.  Uvula  midline.  Gags present.  Motor examination:  5/5 strength upper and  lower extremities.  Coordination revealed finger-to-nose, heel-to-shin  to be intact.  Sensory examination:  Intact to pinprick, touch,  __________position, vibration.  Deep tendon reflexes 2+.  Plantar  responses downgoing.  GENERAL EXAMINATION:  With tympanic membranes clear.  She had very few  teeth.  Breath sounds were normal in the lungs.  There was no heart murmur.  There was no enlargement of the liver, spleen or kidney.  Bowel sounds  were normal.   LABORATORY DATA:  Urinalysis  was unremarkable.  MRI showed old left  greater than right occipital strokes.  MRA seemed to be okay.  White  blood cell count was 4100, hemoglobin 12.6, hematocrit 37.4, platelet  count 363,000.  Sodium 137, potassium 4.6, chloride 105, CO2 content 26,  BUN 14, creatinine 0.74, glucose 115.   IMPRESSION:  1. Right brain transient ischemic attack, code 345.9.  2. Bilateral cerebral strokes, code 433.31.  3. Hypertension, code 796.2.  4. Headaches, code 784.0.   PLAN:  At this time is to admit the patient for further evaluation.  She  will undergo extracranial MRI to evaluate the vertebral systems as most  of her strokes seemed to have occurred in the posterior circulation  distribution.  Also, a sed rate and RPR in view of her history of  headaches.           ______________________________  Genene Churn. Sandria Manly, M.D.     JML/MEDQ  D:  01/03/2007  T:  01/04/2007  Job:  [id number]

## 2011-03-07 NOTE — Cardiovascular Report (Signed)
[facility]  Patient:    [name], [name] Visit Number: [id number] MRN: [id number]          Service Type: MED Location: 2000 2039 01 Attending Physician:  Rollene Rotunda Dictated by:   Noralyn Pick. Eden Emms, M.D. [facility] Admit Date:  07/11/2001   CC:         Davis L. Cloward, M.D.  Rollene Rotunda, M.D. [facility]   Cardiac Catheterization  PROCEDURE:  Arteriography.  FINDINGS: 1. The left main coronary artery had a 20% discrete stenosis. 2. The left anterior descending artery had a 50-60% tubular lesion in the    proximal vessel.  The mid and distal vessel were normal size.  There    was a large intermedius branch with a 20% discrete stenosis.  The    first diagonal branch had a 20% discrete stenosis. 3. 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  There is a 20% proximal lesion. 4. The right coronary artery was dominant.  There were 20-30% multiple    discrete lesions in the proximal and mid portion.  RAO ventriculography:  The RAO ventriculography was normal.  There was no evidence of cardiac enlargement.  Ejection fraction was 60%.  There was a small gradient across the aortic valve.  LV pressure was 160/11.  Aortic pressure was 150/72.  By 2D echocardiography, there was no evidence of significant aortic stenosis.  IMPRESSION:  The patients chest pain would appear to be noncardiac in etiology.  She ruled out and did not have EKG changes.  She will need the proximal LAD watched over time as she is certainly at risk for progression of disease as the area of the LAD is quite calcified, and if she were to need an intervention in the future, I would suspect she would need a Rotablator; however, in multiple views, the artery does not seem to be tight enough to be causing symptoms at this time.  She will be discharged later today to follow up with Dr. Antoine Poche in a month.  She will most likely require a  Cardiolite study in two to three months. Dictated by:   Noralyn Pick Eden Emms, M.D. [facility] Attending Physician:  Rollene Rotunda DD:  07/13/01 TD:  07/13/01 Job: [id number]

## 2011-07-21 LAB — CBC
HCT: 27.1 — ABNORMAL LOW
HCT: 34.8 — ABNORMAL LOW
Hemoglobin: 9.1 — ABNORMAL LOW
Hemoglobin: 9.6 — ABNORMAL LOW
MCHC: 33.5
MCHC: 33.7
MCV: 87.3
MCV: 87.8
MCV: 89.2
MCV: 89.4
MCV: 90
Platelets: 194
Platelets: 224
Platelets: 238
Platelets: 297
RBC: 3.17 — ABNORMAL LOW
RBC: 3.44 — ABNORMAL LOW
RDW: 13.2
RDW: 13.3
RDW: 13.3
RDW: 13.6
WBC: 4.1
WBC: 8.8

## 2011-07-21 LAB — BASIC METABOLIC PANEL
BUN: 10
BUN: 11
BUN: 8
CO2: 27
Calcium: 8.5
Chloride: 100
Chloride: 106
Chloride: 109
Chloride: 97
Creatinine, Ser: 0.82
GFR calc Af Amer: >60
GFR calc Af Amer: >60
GFR calc non Af Amer: >60
GFR calc non Af Amer: >60
Glucose, Bld: 100 — ABNORMAL HIGH
Glucose, Bld: 127 — ABNORMAL HIGH
Glucose, Bld: 169 — ABNORMAL HIGH
Potassium: 3.3 — ABNORMAL LOW
Potassium: 4.4
Sodium: 132 — ABNORMAL LOW
Sodium: 136

## 2011-07-21 LAB — CK TOTAL AND CKMB (NOT AT ARMC)
CK, MB: 3
Relative Index: 0.2
Relative Index: 0.3
Total CK: 915 — ABNORMAL HIGH

## 2011-07-21 LAB — PROTIME-INR
INR: 1.1
INR: 1.5
INR: 2.1 — ABNORMAL HIGH
INR: 2.5 — ABNORMAL HIGH
INR: 2.5 — ABNORMAL HIGH
Prothrombin Time: 13
Prothrombin Time: 14.2
Prothrombin Time: 18.9 — ABNORMAL HIGH
Prothrombin Time: 24.5 — ABNORMAL HIGH
Prothrombin Time: 28.3 — ABNORMAL HIGH
Prothrombin Time: 22.1 — ABNORMAL HIGH

## 2011-07-21 LAB — TYPE AND SCREEN: ABO/RH(D): A POS

## 2011-07-21 LAB — URINALYSIS, ROUTINE W REFLEX MICROSCOPIC
Glucose, UA: NEGATIVE
Hgb urine dipstick: NEGATIVE
Protein, ur: NEGATIVE
pH: 5.5

## 2011-07-21 LAB — DIFFERENTIAL
Eosinophils Absolute: 0.2
Eosinophils Relative: 5
Lymphs Abs: 1.1

## 2011-07-21 LAB — ABO/RH: ABO/RH(D): A POS

## 2011-07-21 LAB — URINE MICROSCOPIC-ADD ON

## 2011-09-26 ENCOUNTER — Emergency Department (HOSPITAL_COMMUNITY)
Admission: EM | Admit: 2011-09-26 | Discharge: 2011-09-26 | Disposition: A | Discharge status: Home / Self Care | Payer: Medicare Other | Attending: Emergency Medicine | Admitting: Emergency Medicine

## 2011-09-26 ENCOUNTER — Encounter: Payer: Self-pay | Admitting: Emergency Medicine

## 2011-09-26 ENCOUNTER — Emergency Department (HOSPITAL_COMMUNITY): Payer: Medicare Other

## 2011-09-26 DIAGNOSIS — R509 Fever, unspecified: Secondary | ICD-10-CM

## 2011-09-26 DIAGNOSIS — R05 Cough: Secondary | ICD-10-CM | POA: Insufficient documentation

## 2011-09-26 DIAGNOSIS — I10 Essential (primary) hypertension: Secondary | ICD-10-CM | POA: Insufficient documentation

## 2011-09-26 DIAGNOSIS — I2510 Atherosclerotic heart disease of native coronary artery without angina pectoris: Secondary | ICD-10-CM | POA: Insufficient documentation

## 2011-09-26 DIAGNOSIS — R059 Cough, unspecified: Secondary | ICD-10-CM | POA: Insufficient documentation

## 2011-09-26 DIAGNOSIS — Z79899 Other long term (current) drug therapy: Secondary | ICD-10-CM | POA: Insufficient documentation

## 2011-09-26 DIAGNOSIS — R0602 Shortness of breath: Secondary | ICD-10-CM | POA: Insufficient documentation

## 2011-09-26 DIAGNOSIS — N390 Urinary tract infection, site not specified: Secondary | ICD-10-CM | POA: Insufficient documentation

## 2011-09-26 HISTORY — DX: Essential (primary) hypertension: I10

## 2011-09-26 HISTORY — DX: Atherosclerotic heart disease of native coronary artery without angina pectoris: I25.10

## 2011-09-26 HISTORY — DX: Heart failure, unspecified: I50.9

## 2011-09-26 LAB — DIFFERENTIAL
Basophils Absolute: 0.1 10*3/uL (ref 0.0–0.1)
Basophils Relative: 1 % (ref 0–1)
Eosinophils Absolute: 0.2 10*3/uL (ref 0.0–0.7)
Monocytes Relative: 12 % (ref 3–12)
Neutrophils Relative %: 79 % — ABNORMAL HIGH (ref 43–77)

## 2011-09-26 LAB — COMPREHENSIVE METABOLIC PANEL
ALT: 10 U/L (ref 0–35)
AST: 19 U/L (ref 0–37)
BUN: 13 mg/dL (ref 6–23)
Calcium: 9.3 mg/dL (ref 8.4–10.5)
GFR calc Af Amer: >90 mL/min (ref >90)
GFR calc non Af Amer: 79 mL/min — ABNORMAL LOW (ref >90)
Potassium: 3.1 mEq/L — ABNORMAL LOW (ref 3.5–5.1)
Total Bilirubin: 0.3 mg/dL (ref 0.3–1.2)

## 2011-09-26 LAB — URINE MICROSCOPIC-ADD ON

## 2011-09-26 LAB — CBC
Hemoglobin: 12.4 g/dL (ref 12.0–15.0)
MCH: 28.4 pg (ref 26.0–34.0)
MCHC: 32.4 g/dL (ref 30.0–36.0)
Platelets: 247 10*3/uL (ref 150–400)
RDW: 13.1 % (ref 11.5–15.5)

## 2011-09-26 LAB — URINALYSIS, ROUTINE W REFLEX MICROSCOPIC
Nitrite: NEGATIVE
Protein, ur: 30 mg/dL — AB
Urobilinogen, UA: 1 mg/dL (ref 0.0–1.0)
pH: 6 (ref 5.0–8.0)

## 2011-09-26 MED ORDER — CIPROFLOXACIN HCL 500 MG PO TABS
500.0000 mg | ORAL_TABLET | Freq: Once | ORAL | Status: AC
  Administered 2011-09-26: 500 mg via ORAL
  Filled 2011-09-26: qty 1

## 2011-09-26 MED ORDER — SODIUM CHLORIDE 0.9 % IJ SOLN: 3.0000 mL | INTRAMUSCULAR | Status: DC

## 2011-09-26 MED ORDER — CIPROFLOXACIN HCL 500 MG PO TABS: 500.0000 mg | ORAL_TABLET | Freq: Two times a day (BID) | ORAL | Status: AC

## 2011-09-26 MED ORDER — SODIUM CHLORIDE 0.9 % IV BOLUS (SEPSIS)
1000.0000 mL | Freq: Once | INTRAVENOUS | Status: AC
  Administered 2011-09-26: 1000 mL via INTRAVENOUS

## 2011-09-26 MED ORDER — ACETAMINOPHEN 325 MG PO TABS
650.0000 mg | ORAL_TABLET | Freq: Once | ORAL | Status: AC
  Administered 2011-09-26: 650 mg via ORAL
  Filled 2011-09-26: qty 2

## 2011-09-26 MED ORDER — POTASSIUM CHLORIDE 20 MEQ/15ML (10%) PO LIQD
40.0000 meq | Freq: Once | ORAL | Status: AC
  Administered 2011-09-26: 40 meq via ORAL
  Filled 2011-09-26: qty 30

## 2011-09-26 NOTE — ED Provider Notes (Signed)
History     CSN: [id number] Arrival date & time: 09/26/2011  6:06 PM   First MD Initiated Contact with Patient 09/26/11 1858      Chief Complaint  Patient presents with  . Shortness of Breath  . Cough  . Fatigue    Patient is a 77 y.o. female presenting with shortness of breath and cough.  Shortness of Breath  Associated symptoms include cough and shortness of breath.  Cough Associated symptoms include shortness of breath.   Patient presents with productive cough low-grade fevers and chills and generalized weakness for 24 hours.  A rectal temp of 100.5 in the ER.  She denies shortness of breath.  She reports nasal congestion.  She denies abdominal pain nausea and vomiting.  She denies flank pain.  She denies dysuria but does admit to urinary frequency.  Denies vaginal complaints.  No recent sick contacts.  Patient is without other complaints.  Nothing worsens her symptoms.  Nothing improves her symptoms.  Her symptoms are mild in severity  Past Medical History  Diagnosis Date  . CHF (congestive heart failure)   . Coronary artery disease   . Hypertension     No past surgical history on file.  No family history on file.  History  Substance Use Topics  . Smoking status: Not on file  . Smokeless tobacco: Not on file  . Alcohol Use:     OB History    Grav Para Term Preterm Abortions TAB SAB Ect Mult Living                  Review of Systems  Respiratory: Positive for cough and shortness of breath.   All other systems reviewed and are negative.    Allergies  Tetracyclines & related  Home Medications   Current Outpatient Rx  Name Route Sig Dispense Refill  . ASPIRIN 81 MG PO CHEW Oral Chew 81 mg by mouth daily.      Marland Kitchen CLOPIDOGREL BISULFATE 75 MG PO TABS Oral Take 75 mg by mouth daily.      . FUROSEMIDE 40 MG PO TABS Oral Take 40 mg by mouth 2 (two) times daily.      Marland Kitchen LOSARTAN POTASSIUM 50 MG PO TABS Oral Take 50 mg by mouth daily.      Marland Kitchen METOPROLOL TARTRATE 25  MG PO TABS Oral Take 25 mg by mouth 2 (two) times daily.      Marland Kitchen SIMVASTATIN 40 MG PO TABS Oral Take 40 mg by mouth daily.        BP 139/59  Pulse 95  Temp(Src) 100.5 F (38.1 C) (Oral)  Ht 5\' 4"  (1.626 m)  Wt 200 lb (90.719 kg)  BMI 34.33 kg/m2  SpO2 96%  Physical Exam  Nursing note and vitals reviewed. Constitutional: She is oriented to person, place, and time. She appears well-developed and well-nourished. No distress.  HENT:  Head: Normocephalic and atraumatic.  Eyes: EOM are normal.  Neck: Normal range of motion.  Cardiovascular: Normal rate, regular rhythm and normal heart sounds.   Pulmonary/Chest: Effort normal and breath sounds normal.  Abdominal: Soft. She exhibits no distension. There is no tenderness.  Musculoskeletal: Normal range of motion.  Neurological: She is alert and oriented to person, place, and time.  Skin: Skin is warm and dry.  Psychiatric: She has a normal mood and affect. Judgment normal.    ED Course  Procedures (including critical care time)  Labs Reviewed  DIFFERENTIAL - Abnormal; Notable for the  following:    Neutrophils Relative 79 (*)    Lymphocytes Relative 6 (*)    Lymphs Abs 0.5 (*)    All other components within normal limits  COMPREHENSIVE METABOLIC PANEL - Abnormal; Notable for the following:    Potassium 3.1 (*)    Glucose, Bld 132 (*)    Albumin 3.4 (*)    GFR calc non Af Amer 79 (*)    All other components within normal limits  URINALYSIS, ROUTINE W REFLEX MICROSCOPIC - Abnormal; Notable for the following:    APPearance CLOUDY (*)    Bilirubin Urine SMALL (*)    Ketones, ur TRACE (*)    Protein, ur 30 (*)    Leukocytes, UA MODERATE (*)    All other components within normal limits  URINE MICROSCOPIC-ADD ON - Abnormal; Notable for the following:    Squamous Epithelial / LPF MANY (*)    Bacteria, UA FEW (*)    All other components within normal limits  CBC  CULTURE, BLOOD (ROUTINE X 2)  CULTURE, BLOOD (ROUTINE X 2)  URINE  CULTURE   Dg Chest 2 View  09/26/2011  *RADIOLOGY REPORT*  Clinical Data: Cough.  Fever.  Shortness of breath.  CHEST - 2 VIEW 09/26/2011:  Comparison: Two-view chest x-ray 04/25/2009 and 01/03/2007 [facility].  Findings: Cardiac silhouette mildly enlarged but stable.  Thoracic aorta tortuous and atherosclerotic, unchanged.  Hilar and mediastinal contours otherwise unremarkable.  Mildly prominent bronchovascular markings diffusely, unchanged.  No new pulmonary parenchymal abnormalities.  No pleural effusions.  Degenerative changes involving the thoracic spine.  No significant interval change.  IMPRESSION: Stable mild cardiomegaly and mild to moderate changes of chronic bronchitis and/or asthma.  No acute cardiopulmonary disease.  Original Report Authenticated By: Arnell Sieving, M.D.     1. Fever   2. Urinary tract infection       MDM  Mild hypokalemia treated in the emergency department.  Generalized weakness.  Patient does have evidence of urinary tract infection patient will be sent home with a prescription for ciprofloxacin. cipro in the ER.  Patient has ambulated to the bathroom without difficulty.        Lyanne Co, MD 09/26/11 2159

## 2011-09-26 NOTE — ED Notes (Signed)
On arrival to ED--report from [facility]-c/o productive  Cough for yellow sputum. States vomited prior to arrival, family stated appeared to be choking,

## 2011-09-26 NOTE — ED Notes (Signed)
Pt in bed, alert and oriented x 3, resp even and unlabored, denies sob, lung sounds noted with rhonchi bilaterally, denies pain @ this time (+) occasional non-productive cough, will continue to monitor

## 2011-09-26 NOTE — ED Notes (Signed)
Dr. Patria Mane in room to assess.

## 2011-09-26 NOTE — ED Notes (Signed)
HQI:ON62<XB> Expected date:09/26/11<BR> Expected time: 5:22 PM<BR> Means of arrival:Ambulance<BR> Comments:<BR> EMS 31 GC - fever/chills

## 2011-09-28 LAB — URINE CULTURE

## 2011-10-02 LAB — CULTURE, BLOOD (ROUTINE X 2)
Culture  Setup Time: 201212072315
Culture: NO GROWTH

## 2012-04-30 ENCOUNTER — Other Ambulatory Visit: Payer: Self-pay | Admitting: Family Medicine

## 2012-04-30 DIAGNOSIS — R1310 Dysphagia, unspecified: Secondary | ICD-10-CM

## 2012-05-05 ENCOUNTER — Ambulatory Visit
Admission: RE | Admit: 2012-05-05 | Discharge: 2012-05-05 | Disposition: A | Discharge status: Home / Self Care | Payer: Medicare Other | Source: Ambulatory Visit | Attending: Family Medicine | Admitting: Family Medicine

## 2012-05-05 DIAGNOSIS — R1310 Dysphagia, unspecified: Secondary | ICD-10-CM

## 2016-06-14 ENCOUNTER — Encounter (HOSPITAL_COMMUNITY): Payer: Self-pay | Admitting: Emergency Medicine

## 2016-06-14 ENCOUNTER — Emergency Department (HOSPITAL_COMMUNITY): Payer: Medicare Other

## 2016-06-14 ENCOUNTER — Inpatient Hospital Stay (HOSPITAL_COMMUNITY)
Admission: EM | Admit: 2016-06-14 | Discharge: 2016-06-17 | DRG: 070 | Disposition: A | Discharge status: Skilled Nursing Facility | Payer: Medicare Other | Attending: Internal Medicine | Admitting: Internal Medicine

## 2016-06-14 DIAGNOSIS — F0390 Unspecified dementia without behavioral disturbance: Secondary | ICD-10-CM | POA: Diagnosis present

## 2016-06-14 DIAGNOSIS — I2510 Atherosclerotic heart disease of native coronary artery without angina pectoris: Secondary | ICD-10-CM | POA: Diagnosis present

## 2016-06-14 DIAGNOSIS — Z7902 Long term (current) use of antithrombotics/antiplatelets: Secondary | ICD-10-CM

## 2016-06-14 DIAGNOSIS — N390 Urinary tract infection, site not specified: Secondary | ICD-10-CM | POA: Diagnosis present

## 2016-06-14 DIAGNOSIS — R4182 Altered mental status, unspecified: Secondary | ICD-10-CM | POA: Diagnosis present

## 2016-06-14 DIAGNOSIS — I509 Heart failure, unspecified: Secondary | ICD-10-CM | POA: Diagnosis present

## 2016-06-14 DIAGNOSIS — J150 Pneumonia due to Klebsiella pneumoniae: Secondary | ICD-10-CM | POA: Diagnosis present

## 2016-06-14 DIAGNOSIS — Z8673 Personal history of transient ischemic attack (TIA), and cerebral infarction without residual deficits: Secondary | ICD-10-CM | POA: Diagnosis not present

## 2016-06-14 DIAGNOSIS — I639 Cerebral infarction, unspecified: Secondary | ICD-10-CM | POA: Diagnosis not present

## 2016-06-14 DIAGNOSIS — Z7982 Long term (current) use of aspirin: Secondary | ICD-10-CM | POA: Diagnosis not present

## 2016-06-14 DIAGNOSIS — F0391 Unspecified dementia with behavioral disturbance: Secondary | ICD-10-CM | POA: Diagnosis present

## 2016-06-14 DIAGNOSIS — Z9119 Patient's noncompliance with other medical treatment and regimen: Secondary | ICD-10-CM

## 2016-06-14 DIAGNOSIS — I493 Ventricular premature depolarization: Secondary | ICD-10-CM | POA: Diagnosis present

## 2016-06-14 DIAGNOSIS — E538 Deficiency of other specified B group vitamins: Secondary | ICD-10-CM | POA: Diagnosis present

## 2016-06-14 DIAGNOSIS — G9341 Metabolic encephalopathy: Principal | ICD-10-CM | POA: Diagnosis present

## 2016-06-14 DIAGNOSIS — E876 Hypokalemia: Secondary | ICD-10-CM | POA: Diagnosis present

## 2016-06-14 DIAGNOSIS — Z79899 Other long term (current) drug therapy: Secondary | ICD-10-CM | POA: Diagnosis not present

## 2016-06-14 DIAGNOSIS — F03918 Unspecified dementia, unspecified severity, with other behavioral disturbance: Secondary | ICD-10-CM | POA: Diagnosis present

## 2016-06-14 DIAGNOSIS — I10 Essential (primary) hypertension: Secondary | ICD-10-CM | POA: Diagnosis present

## 2016-06-14 DIAGNOSIS — F22 Delusional disorders: Secondary | ICD-10-CM | POA: Diagnosis present

## 2016-06-14 DIAGNOSIS — E785 Hyperlipidemia, unspecified: Secondary | ICD-10-CM | POA: Diagnosis present

## 2016-06-14 DIAGNOSIS — I110 Hypertensive heart disease with heart failure: Secondary | ICD-10-CM | POA: Diagnosis present

## 2016-06-14 HISTORY — DX: Cerebral infarction, unspecified: I63.9

## 2016-06-14 LAB — COMPREHENSIVE METABOLIC PANEL
ALT: 11 U/L — AB (ref 14–54)
ANION GAP: 6 (ref 5–15)
AST: 15 U/L (ref 15–41)
Albumin: 3.4 g/dL — ABNORMAL LOW (ref 3.5–5.0)
Alkaline Phosphatase: 64 U/L (ref 38–126)
BUN: 10 mg/dL (ref 6–20)
CHLORIDE: 113 mmol/L — AB (ref 101–111)
CO2: 26 mmol/L (ref 22–32)
CREATININE: 0.86 mg/dL (ref 0.44–1.00)
Calcium: 9.1 mg/dL (ref 8.9–10.3)
Glucose, Bld: 105 mg/dL — ABNORMAL HIGH (ref 65–99)
POTASSIUM: 3 mmol/L — AB (ref 3.5–5.1)
Sodium: 145 mmol/L (ref 135–145)
Total Bilirubin: 0.8 mg/dL (ref 0.3–1.2)
Total Protein: 6.2 g/dL — ABNORMAL LOW (ref 6.5–8.1)

## 2016-06-14 LAB — URINALYSIS, ROUTINE W REFLEX MICROSCOPIC
GLUCOSE, UA: NEGATIVE mg/dL
KETONES UR: 15 mg/dL — AB
NITRITE: NEGATIVE
PH: 5.5 (ref 5.0–8.0)
Protein, ur: 30 mg/dL — AB
SPECIFIC GRAVITY, URINE: 1.028 (ref 1.005–1.030)

## 2016-06-14 LAB — CBC
HCT: 39 % (ref 36.0–46.0)
Hemoglobin: 12.1 g/dL (ref 12.0–15.0)
MCH: 28.7 pg (ref 26.0–34.0)
MCHC: 31 g/dL (ref 30.0–36.0)
MCV: 92.4 fL (ref 78.0–100.0)
PLATELETS: 206 10*3/uL (ref 150–400)
RBC: 4.22 MIL/uL (ref 3.87–5.11)
RDW: 14.6 % (ref 11.5–15.5)
WBC: 4.5 10*3/uL (ref 4.0–10.5)

## 2016-06-14 LAB — URINE MICROSCOPIC-ADD ON

## 2016-06-14 MED ORDER — ONDANSETRON HCL 4 MG PO TABS: 4.0000 mg | ORAL_TABLET | Freq: Four times a day (QID) | ORAL | Status: DC | PRN

## 2016-06-14 MED ORDER — LOSARTAN POTASSIUM 50 MG PO TABS
50.0000 mg | ORAL_TABLET | Freq: Every day | ORAL | Status: DC
  Administered 2016-06-14 – 2016-06-16 (×3): 50 mg via ORAL
  Filled 2016-06-14 (×4): qty 1

## 2016-06-14 MED ORDER — METOPROLOL TARTRATE 25 MG PO TABS
25.0000 mg | ORAL_TABLET | Freq: Two times a day (BID) | ORAL | Status: DC
  Administered 2016-06-14 – 2016-06-16 (×5): 25 mg via ORAL
  Filled 2016-06-14 (×6): qty 1

## 2016-06-14 MED ORDER — STROKE: EARLY STAGES OF RECOVERY BOOK
Freq: Once | Status: AC
  Administered 2016-06-14: 1

## 2016-06-14 MED ORDER — CLOPIDOGREL BISULFATE 75 MG PO TABS
75.0000 mg | ORAL_TABLET | Freq: Every day | ORAL | Status: DC
  Administered 2016-06-14 – 2016-06-16 (×3): 75 mg via ORAL
  Filled 2016-06-14 (×4): qty 1

## 2016-06-14 MED ORDER — POTASSIUM CHLORIDE CRYS ER 20 MEQ PO TBCR
40.0000 meq | EXTENDED_RELEASE_TABLET | Freq: Once | ORAL | Status: AC
  Administered 2016-06-14: 40 meq via ORAL
  Filled 2016-06-14: qty 2

## 2016-06-14 MED ORDER — ONDANSETRON HCL 4 MG/2ML IJ SOLN: 4.0000 mg | Freq: Four times a day (QID) | INTRAMUSCULAR | Status: DC | PRN

## 2016-06-14 MED ORDER — ENOXAPARIN SODIUM 40 MG/0.4ML ~~LOC~~ SOLN
40.0000 mg | SUBCUTANEOUS | Status: DC
Start: 2016-06-14 — End: 2016-06-17
  Administered 2016-06-15 – 2016-06-16 (×2): 40 mg via SUBCUTANEOUS
  Filled 2016-06-14 (×3): qty 0.4

## 2016-06-14 MED ORDER — DEXTROSE 5 % IV SOLN
1.0000 g | INTRAVENOUS | Status: DC
  Administered 2016-06-15 – 2016-06-17 (×3): 1 g via INTRAVENOUS
  Filled 2016-06-14 (×3): qty 10

## 2016-06-14 MED ORDER — ASPIRIN 81 MG PO CHEW
81.0000 mg | CHEWABLE_TABLET | Freq: Every day | ORAL | Status: DC
  Administered 2016-06-14 – 2016-06-16 (×3): 81 mg via ORAL
  Filled 2016-06-14 (×4): qty 1

## 2016-06-14 MED ORDER — POTASSIUM CHLORIDE IN NACL 40-0.9 MEQ/L-% IV SOLN
INTRAVENOUS | Status: AC
  Administered 2016-06-14: 88 mL/h via INTRAVENOUS
  Filled 2016-06-14 (×2): qty 1000

## 2016-06-14 MED ORDER — SODIUM CHLORIDE 0.9% FLUSH
3.0000 mL | Freq: Two times a day (BID) | INTRAVENOUS | Status: DC
  Administered 2016-06-16: 3 mL via INTRAVENOUS

## 2016-06-14 MED ORDER — SIMVASTATIN 40 MG PO TABS
40.0000 mg | ORAL_TABLET | Freq: Every day | ORAL | Status: DC
  Administered 2016-06-14 – 2016-06-16 (×3): 40 mg via ORAL
  Filled 2016-06-14 (×4): qty 1

## 2016-06-14 MED ORDER — ALPRAZOLAM 0.25 MG PO TABS
0.2500 mg | ORAL_TABLET | Freq: Two times a day (BID) | ORAL | Status: DC | PRN
  Administered 2016-06-15: 0.25 mg via ORAL
  Filled 2016-06-14: qty 1

## 2016-06-14 MED ORDER — DEXTROSE 5 % IV SOLN
1.0000 g | Freq: Once | INTRAVENOUS | Status: AC
  Administered 2016-06-14: 1 g via INTRAVENOUS
  Filled 2016-06-14: qty 10

## 2016-06-14 MED ORDER — FUROSEMIDE 40 MG PO TABS
40.0000 mg | ORAL_TABLET | Freq: Two times a day (BID) | ORAL | Status: DC
  Administered 2016-06-14 – 2016-06-16 (×5): 40 mg via ORAL
  Filled 2016-06-14 (×5): qty 1

## 2016-06-14 NOTE — H&P (Signed)
History and Physical    [name] WJX:[id number] DOB: 01/18/1933 DOA: 06/14/2016  PCP: Lolita Patella, MD   Patient coming from: Home.  Chief Complaint:  Altered mental Status.  HPI: [name] is a 82 y.o. female with medical history significant of dementia, CHF, CAD, HTN, CVA who was brought to the emergency department by her husband and niece due to worsening dementia.  Per family members, they have been noticing that the patient has been having increased confusion, trouble with medications and noncompliance with PCP's appointments for the past few months. Recently, in the past few days, she has been having difficulty with her sleep-wake cycle. She has also become increasingly confused, paranoid and delusional. No fever, no chills, no chest pain, dyspnea, lower extremity edema, abdominal pain, diarrhea or constipation. She denies GU symptoms.   ED Course: The patient received IV Rocephin and potassium replacement in the emergency department. Workup showed hypokalemia 3.0 mmol/L, abnormal UA consistent with UTI, EKG with multiple PVCs, possible subacute infarct in the left frontal lobe on CT of the brain.  Review of Systems: As per HPI otherwise 10 point review of systems negative.    Past Medical History:  Diagnosis Date  . CHF (congestive heart failure) (HCC)   . Coronary artery disease   . Hypertension   . 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 No pertinent surgical history.   reports that she has never smoked. She has never used smokeless tobacco. She reports that she does not drink alcohol or use drugs.  Allergies  Allergen Reactions  . Tetracyclines & Related Rash    Family History  Problem Relation Age of Onset  . Heart disease Mother      Prior to Admission medications   Medication Sig Start Date End Date Taking? Authorizing Provider  aspirin 81 MG chewable tablet Chew 81 mg by mouth daily.      Historical Provider, MD    clopidogrel (PLAVIX) 75 MG tablet Take 75 mg by mouth daily.      Historical Provider, MD  furosemide (LASIX) 40 MG tablet Take 40 mg by mouth 2 (two) times daily.      Historical Provider, MD  losartan (COZAAR) 50 MG tablet Take 50 mg by mouth daily.      Historical Provider, MD  metoprolol tartrate (LOPRESSOR) 25 MG tablet Take 25 mg by mouth 2 (two) times daily.      Historical Provider, MD  simvastatin (ZOCOR) 40 MG tablet Take 40 mg by mouth daily.      Historical Provider, MD    Physical Exam: Vitals:   06/14/16 1945 06/14/16 1946 06/14/16 2000 06/14/16 2200  BP:  161/75 (!) 144/79 (!) 151/90  Pulse: 87  81 68  Resp: 21  18 18   Temp: 98.8 F (37.1 C)  98.6 F (37 C) 98.4 F (36.9 C)  TempSrc: Oral  Oral Oral  SpO2: 100%  100% 99%  Weight:   85.2 kg (187 lb 12.8 oz)   Height:   5\' 5"  (1.651 m)       Constitutional: NAD, calm, comfortable Eyes: PERRL, lids and conjunctivae normal ENMT: Mucous membranes are moist. Posterior pharynx clear of any exudate or lesions. The patient upper dentition is absent. She has only three teeth, right lower incisor, left lower canine and first premolar.  Neck: normal, supple, no masses, no thyromegaly Respiratory: clear to auscultation bilaterally, no wheezing, no crackles. Normal respiratory effort. No accessory muscle use.  Cardiovascular: Regular  rate and rhythm, no murmurs / rubs / gallops. No extremity edema. 2+ pedal pulses. No carotid bruits.  Abdomen: Bowel sounds positive.Soft, no tenderness, no masses palpated. No hepatosplenomegaly.  Musculoskeletal: no clubbing / cyanosis.Good ROM, no contractures. Normal muscle tone.  Skin: no rashes, lesions, ulcers. No induration Neurologic: CN 2-12 grossly intact. Sensation intact, DTR normal. Strength 5/5 in all 4.  Psychiatric:  Alert and oriented x 2, partially oriented to time (notes the day of the week, month, had  some difficulty recalling the year), she mentioned that she does not know  why she is in the hospital and is blaming her husband and relatives for her presence in the facility.    Labs on Admission: I have personally reviewed following labs and imaging studies  CBC:  Recent Labs Lab 06/14/16 1406  WBC 4.5  HGB 12.1  HCT 39.0  MCV 92.4  PLT 206   Basic Metabolic Panel:  Recent Labs Lab 06/14/16 1524  NA 145  K 3.0*  CL 113*  CO2 26  GLUCOSE 105*  BUN 10  CREATININE 0.86  CALCIUM 9.1   GFR: Estimated Creatinine Clearance: 55.3 mL/min (by C-G formula based on SCr of 0.86 mg/dL). Liver Function Tests:  Recent Labs Lab 06/14/16 1524  AST 15  ALT 11*  ALKPHOS 64  BILITOT 0.8  PROT 6.2*  ALBUMIN 3.4*    Urine analysis:    Component Value Date/Time   COLORURINE YELLOW 06/14/2016 1241   APPEARANCEUR TURBID (A) 06/14/2016 1241   LABSPEC 1.028 06/14/2016 1241   PHURINE 5.5 06/14/2016 1241   GLUCOSEU NEGATIVE 06/14/2016 1241   HGBUR SMALL (A) 06/14/2016 1241   BILIRUBINUR SMALL (A) 06/14/2016 1241   KETONESUR 15 (A) 06/14/2016 1241   PROTEINUR 30 (A) 06/14/2016 1241   UROBILINOGEN 1.0 09/26/2011 1954   NITRITE NEGATIVE 06/14/2016 1241   LEUKOCYTESUR SMALL (A) 06/14/2016 1241    Radiological Exams on Admission: Ct Head Wo Contrast  Result Date: 06/14/2016 CLINICAL DATA:  Increase confusion EXAM: CT HEAD WITHOUT CONTRAST TECHNIQUE: Contiguous axial images were obtained from the base of the skull through the vertex without intravenous contrast. COMPARISON:  January 03, 2007 FINDINGS: Brain: There is decreased density of left frontal gray and white matter new since prior exam. There is low density involving the superior right frontal lobe new since the prior exam. There is low density involving the bilateral occipital lobes unchanged compared to prior exam, consistent with old infarct. Chronic bilateral periventricular white matter small vessel ischemic changes are noted. There is no midline shift or hydrocephalus. No acute hemorrhage is  identified. Vascular: The vessels are unchanged. Skull: There is no acute fracture or dislocation. Sinuses/Orbits: There is a small retention cyst in the left maxillary sinus. The sinuses are otherwise clear. The orbits are normal. IMPRESSION: Decreased density involving the gray-white matter of bilateral frontal lobes new since prior head CT of January 03, 2007. Subacute infarct in the left frontal lobe is not excluded. The finding in the right frontal lobe is probably due to old infarct. Suggest further evaluation with MR brain. 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 These results will be called to the ordering clinician or representative by the Radiologist Assistant, and communication documented in the PACS or zVision Dashboard. Electronically Signed   By: Sherian Rein M.D.   On: 06/14/2016 16:00    EKG: Independently reviewed.  Vent. rate 86 BPM PR interval * ms QRS duration 98 ms QT/QTc 384/460 ms P-R-T axes 62 37 14 Sinus  rhythm Multiform ventricular premature complexes Borderline low voltage, extremity leads  Assessment/Plan Principal Problem:   Stroke (cerebrum) (HCC) Likely subacute lacunar infarcts. Admit to telemetry/inpatient. Neuro checks every shift. Continue aspirin and Plavix. PT/OT evaluation. Check MRI of the brain.  Consider neurology evaluation if symptoms worsen.  Active Problems:    UTI (urinary tract infection) Continue Rocephin 1 g IVPB every 24 hours. Follow-up urine culture and sensitivity.    Dementia Check MRI to brain. Supportive care. Social services evaluation. Alprazolam as needed for insomnia or restlessness.  Family members may be interested in assisted living placement.   Hypertension Continue furosemide 40 mg by mouth daily. Continue losartan 50 mg by mouth daily. Continue metoprolol 25 mg by mouth twice a day. Blood pressure monitoring.    Hyperlipidemia Continue simvastatin 40 mg by mouth daily. Follow fasting lipids and LFTs  periodically.    Hypokalemia Replacing. Check magnesium level. Follow-up potassium level in the morning.     DVT prophylaxis: Lovenox SQ. Code Status: Full code. Family Communication: The family has expressed interest in assisted living facility information. Disposition Plan: Admit for further evaluation and treatment. Social services consult. Consults called:  Admission status: Inpatient/telemetry.   Bobette Moavid Manuel Olivea Sonnen MD [facility] Pager [phone].  If 7PM-7AM, please contact night-coverage [email or web address] Password [email or web address]  06/14/2016, 10:11 PM

## 2016-06-14 NOTE — ED Notes (Signed)
Pt given a sandwich bag and a cup of water. HOB raised.

## 2016-06-14 NOTE — ED Notes (Signed)
Patient transported to CT 

## 2016-06-14 NOTE — ED Notes (Signed)
[phone]  Please call when pt gets admitted so he may come pick up pt husband.

## 2016-06-14 NOTE — ED Provider Notes (Signed)
[facility]-EMERGENCY DEPT Provider Note   CSN: [id number] Arrival date & time: 06/14/16  1129     History   Chief Complaint Chief Complaint  Patient presents with  . Altered Mental Status    HPI [name] is a 82 y.o. female.  HPI  82 year old female who lives with her husband. History is obtained from husband and niece. Husband states that she has been having symptoms of dementia over several years. In the past several years she has had difficulty with her sleep wake cycle, and increasing paranoia and delusions.  In the past several days she has had increased confusion which prompted them to bring her in for further evaluation  Past Medical History:  Diagnosis Date  . CHF (congestive heart failure) (HCC)   . Coronary artery disease   . Hypertension     There are no active problems to display for this patient.   History reviewed. No pertinent surgical history.  OB History    No data available       Home Medications    Prior to Admission medications   Medication Sig Start Date End Date Taking? Authorizing Provider  aspirin 81 MG chewable tablet Chew 81 mg by mouth daily.      Historical Provider, MD  clopidogrel (PLAVIX) 75 MG tablet Take 75 mg by mouth daily.      Historical Provider, MD  furosemide (LASIX) 40 MG tablet Take 40 mg by mouth 2 (two) times daily.      Historical Provider, MD  losartan (COZAAR) 50 MG tablet Take 50 mg by mouth daily.      Historical Provider, MD  metoprolol tartrate (LOPRESSOR) 25 MG tablet Take 25 mg by mouth 2 (two) times daily.      Historical Provider, MD  simvastatin (ZOCOR) 40 MG tablet Take 40 mg by mouth daily.      Historical Provider, MD    Family History History reviewed. No pertinent family history.  Social History Social History  Substance Use Topics  . Smoking status: Never Smoker  . Smokeless tobacco: Never Used  . Alcohol use No     Allergies   Tetracyclines & related   Review of Systems Review of  Systems  Unable to perform ROS: Dementia     Physical Exam Updated Vital Signs BP 145/82   Pulse 66   Temp 97.8 F (36.6 C) (Rectal)   Resp 18   Ht 5\' 5"  (1.651 m)   SpO2 97%   Physical Exam  Constitutional: She appears well-developed and well-nourished. No distress.  HENT:  Head: Normocephalic and atraumatic.  A edentulous  Eyes: EOM are normal. Pupils are equal, round, and reactive to light.  Neck: Normal range of motion. Neck supple.  Cardiovascular: Normal rate, regular rhythm and normal heart sounds.   Pulmonary/Chest: Effort normal and breath sounds normal.  Abdominal: Soft. Bowel sounds are normal.  Musculoskeletal: Normal range of motion.  Neurological: She is alert. No cranial nerve deficit. She exhibits normal muscle tone. Coordination normal.  Oriented to person but not to place or time  Skin: Skin is warm and dry. Capillary refill takes less than 2 seconds.  Nursing note and vitals reviewed.    ED Treatments / Results  Labs (all labs ordered are listed, but only abnormal results are displayed) Labs Reviewed  COMPREHENSIVE METABOLIC PANEL  CBC  URINALYSIS, ROUTINE W REFLEX MICROSCOPIC (NOT AT [facility])  CBG MONITORING, ED    EKG  EKG Interpretation None  Radiology Ct Head Wo Contrast  Result Date: 06/14/2016 CLINICAL DATA:  Increase confusion EXAM: CT HEAD WITHOUT CONTRAST TECHNIQUE: Contiguous axial images were obtained from the base of the skull through the vertex without intravenous contrast. COMPARISON:  January 03, 2007 FINDINGS: Brain: There is decreased density of left frontal gray and white matter new since prior exam. There is low density involving the superior right frontal lobe new since the prior exam. There is low density involving the bilateral occipital lobes unchanged compared to prior exam, consistent with old infarct. Chronic bilateral periventricular white matter small vessel ischemic changes are noted. There is no midline shift or  hydrocephalus. No acute hemorrhage is identified. Vascular: The vessels are unchanged. Skull: There is no acute fracture or dislocation. Sinuses/Orbits: There is a small retention cyst in the left maxillary sinus. The sinuses are otherwise clear. The orbits are normal. IMPRESSION: Decreased density involving the gray-white matter of bilateral frontal lobes new since prior head CT of January 03, 2007. Subacute infarct in the left frontal lobe is not excluded. The finding in the right frontal lobe is probably due to old infarct. Suggest further evaluation with MR brain. 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 These results will be called to the ordering clinician or representative by the Radiologist Assistant, and communication documented in the PACS or zVision Dashboard. Electronically Signed   By: Sherian ReinWei-Chen  Lin M.D.   On: 06/14/2016 16:00    Procedures Procedures (including critical care time)  Medications Ordered in ED Medications - No data to display   Initial Impression / Assessment and Plan / ED Course  I have reviewed the triage vital signs and the nursing notes.  Pertinent labs & imaging results that were available during my care of the patient were reviewed by me and considered in my medical decision making (see chart for details).  Clinical Course    82 y.o. Female with dementia presents with worsening symptoms of confusion and has uti here.  Rocephin given.  CMET pending.  Discussed with Dr. Lynelle DoctorKnapp and will admit.   Final Clinical Impressions(s) / ED Diagnoses   Final diagnoses:  None   1- confusion- ct with multiple low density areas, possible old vs subacute infarct. 2- cmet, pending.  3- uti  Discussed with Dr. Lynelle DoctorKnapp and he will arrange admission aft reviewing cmet. New Prescriptions New Prescriptions   No medications on file     Margarita Grizzleanielle Nana Vastine, MD 06/14/16 1645

## 2016-06-14 NOTE — ED Triage Notes (Signed)
Pt here with family- family reports increased confusion, mediction problems since several months ago. Family sts pt has not kept appointments at PCP office.

## 2016-06-14 NOTE — Progress Notes (Signed)
CSW spoke with the pt nephew [name] and pt two nieces who were interested in Assisted Living Facility information for the pt. CSW provided pt family with ALF information.  [name], [name] [name] [name] Clinical Social Worker [phone]

## 2016-06-15 ENCOUNTER — Inpatient Hospital Stay (HOSPITAL_COMMUNITY): Payer: Medicare Other

## 2016-06-15 ENCOUNTER — Encounter (HOSPITAL_COMMUNITY): Payer: Medicare Other

## 2016-06-15 DIAGNOSIS — I10 Essential (primary) hypertension: Secondary | ICD-10-CM

## 2016-06-15 DIAGNOSIS — I639 Cerebral infarction, unspecified: Secondary | ICD-10-CM | POA: Diagnosis not present

## 2016-06-15 DIAGNOSIS — E785 Hyperlipidemia, unspecified: Secondary | ICD-10-CM | POA: Diagnosis not present

## 2016-06-15 DIAGNOSIS — E876 Hypokalemia: Secondary | ICD-10-CM

## 2016-06-15 DIAGNOSIS — G9341 Metabolic encephalopathy: Principal | ICD-10-CM | POA: Diagnosis present

## 2016-06-15 DIAGNOSIS — F0391 Unspecified dementia with behavioral disturbance: Secondary | ICD-10-CM | POA: Diagnosis not present

## 2016-06-15 DIAGNOSIS — E538 Deficiency of other specified B group vitamins: Secondary | ICD-10-CM | POA: Diagnosis not present

## 2016-06-15 DIAGNOSIS — R4182 Altered mental status, unspecified: Secondary | ICD-10-CM | POA: Diagnosis not present

## 2016-06-15 DIAGNOSIS — N3000 Acute cystitis without hematuria: Secondary | ICD-10-CM

## 2016-06-15 MED ORDER — QUETIAPINE FUMARATE 25 MG PO TABS
25.0000 mg | ORAL_TABLET | Freq: Every day | ORAL | Status: DC
  Administered 2016-06-15 – 2016-06-16 (×2): 25 mg via ORAL
  Filled 2016-06-15 (×2): qty 1

## 2016-06-15 MED ORDER — LORAZEPAM 2 MG/ML IJ SOLN
0.5000 mg | Freq: Three times a day (TID) | INTRAMUSCULAR | Status: DC | PRN
  Filled 2016-06-15: qty 1

## 2016-06-15 MED ORDER — SODIUM CHLORIDE 0.9 % IV SOLN
INTRAVENOUS | Status: AC
  Administered 2016-06-15: 09:00:00 via INTRAVENOUS
  Filled 2016-06-15 (×5): qty 1000

## 2016-06-15 MED ORDER — LORAZEPAM 2 MG/ML IJ SOLN
0.2500 mg | Freq: Three times a day (TID) | INTRAMUSCULAR | Status: DC | PRN
  Administered 2016-06-15: 0.25 mg via INTRAVENOUS

## 2016-06-15 NOTE — Progress Notes (Addendum)
PROGRESS NOTE    AKELIA Mcclain  NWG:[id number] DOB: 1933/10/16 DOA: 06/14/2016 PCP: Vena Austria, MD   Brief Narrative:  [name] is a 82 y.o. female with medical history significant of dementia, CHF, CAD, HTN, CVA who was brought to the emergency department by her husband and niece due to worsening dementia.  Per family members, they have been noticing that the patient has been having increased confusion, trouble with medications and noncompliance with PCP's appointments for the past few months. Recently, in the past few days, she has been having difficulty with her sleep-wake cycle. She has also become increasingly confused, paranoid and delusional. No fever, no chills, no chest pain, dyspnea, lower extremity edema, abdominal pain, diarrhea or constipation. She denies GU symptoms.   ED Course: The patient received IV Rocephin and potassium replacement in the emergency department. Workup showed hypokalemia 3.0 mmol/L, abnormal UA consistent with UTI, EKG with multiple PVCs, possible subacute infarct in the left frontal lobe on CT of the brain.    Assessment & Plan:   Principal Problem:   Metabolic encephalopathy Active Problems:   UTI (urinary tract infection)   Stroke (cerebrum) (HCC)   Hypertension   Hyperlipidemia   Hypokalemia   Dementia   #1 metabolic encephalopathy Patient presented with worsening confusion and cognitive decline which have been ongoing for the past 6 months. Concern for worsening dementia versus secondary to UTI in the setting of dementia. Urinalysis obtained was concerning for UTI. Urine cultures have been ordered. Chest x-ray negative for any acute infiltrate. Patient is afebrile. Head CT was concerning for possible subacute stroke however MRI of the head was negative for any acute infarction. Patient has a seen in consultation by neurology who feel no further neurological workup is needed at this time. Will check a B-12 levels RPR RBC  folate. Continue empiric IV Rocephin while urine cultures are pending. Supportive care.  #2 urinary tract infection Urine cultures pending. IV Rocephin.  #3 hypokalemia Patient was given some potassium supplementation yesterday however patient refusing lab draws today. Repeat be met in the morning.  #4 history of CVA Continue aspirin and Plavix for secondary stroke prevention.  #5 hypertension Continue Lasix, Cozaar, metoprolol.  #6 dementia Patient with history of underlying dementia with symptoms does seem to be worsened over the past 6 months per nursing discussion with patient's husband. Patient also noted to have some agitation. Urinalysis is worrisome for UTI and as such patient with urine cultures pending and patient on the IV Rocephin. Placed on Ativan as needed for agitation. Seroquel 25 mg by mouth daily at bedtime. Patient will likely need to follow-up with neurology in the outpatient setting.   DVT prophylaxis: Lovenox Code Status: Full Family Communication: Updated patient. No family at bedside. Disposition Plan: Skilled nursing facility   Consultants:   Neurology: Dr. Tasia Catchings 06/15/2016  Procedures:  CT head 06/14/2016  MRI/MRA brain 06/15/2016  Chest x-ray 06/15/2016  Antimicrobials:   IV Rocephin 06/14/2016   Subjective: Patient agitated. Patient states she is going home even though explained to patient results she needs to stay in-house. Patient refusing lab draws this morning.  Objective: Vitals:   06/15/16 0000 06/15/16 0200 06/15/16 0400 06/15/16 0600  BP: (!) 146/64 (!) 126/95 138/71 (!) 182/78  Pulse: (!) 52 64 63 79  Resp: '20 18 18 20  '$ Temp: 98.6 F (37 C) 98.6 F (37 C) 97.5 F (36.4 C) 97.5 F (36.4 C)  TempSrc: Oral Oral Oral Oral  SpO2: 100% 96%  100% 100%  Weight:      Height:        Intake/Output Summary (Last 24 hours) at 06/15/16 1535 Last data filed at 06/15/16 0500  Gross per 24 hour  Intake           696.67 ml  Output                 0 ml  Net           696.67 ml   Filed Weights   06/14/16 2000  Weight: 85.2 kg (187 lb 12.8 oz)    Examination:  General exam: Somewhat agitated. Respiratory system: Clear to auscultation. Respiratory effort normal. Cardiovascular system: S1 & S2 heard, RRR. No JVD, murmurs, rubs, gallops or clicks. No pedal edema. Gastrointestinal system: Abdomen is nondistended, soft and nontender. No organomegaly or masses felt. Normal bowel sounds heard. Central nervous system: Alert and oriented. No focal neurological deficits. Extremities: Symmetric 5 x 5 power. Skin: No rashes, lesions or ulcers Psychiatry: Judgement and insight appear poor. Mood & affect appropriate.     Data Reviewed: I have personally reviewed following labs and imaging studies  CBC:  Recent Labs Lab 06/14/16 1406  WBC 4.5  HGB 12.1  HCT 39.0  MCV 92.4  PLT 322   Basic Metabolic Panel:  Recent Labs Lab 06/14/16 1524  NA 145  K 3.0*  CL 113*  CO2 26  GLUCOSE 105*  BUN 10  CREATININE 0.86  CALCIUM 9.1   GFR: Estimated Creatinine Clearance: 55.3 mL/min (by C-G formula based on SCr of 0.86 mg/dL). Liver Function Tests:  Recent Labs Lab 06/14/16 1524  AST 15  ALT 11*  ALKPHOS 64  BILITOT 0.8  PROT 6.2*  ALBUMIN 3.4*   No results for input(s): LIPASE, AMYLASE in the last 168 hours. No results for input(s): AMMONIA in the last 168 hours. Coagulation Profile: No results for input(s): INR, PROTIME in the last 168 hours. Cardiac Enzymes: No results for input(s): CKTOTAL, CKMB, CKMBINDEX, TROPONINI in the last 168 hours. BNP (last 3 results) No results for input(s): PROBNP in the last 8760 hours. HbA1C: No results for input(s): HGBA1C in the last 72 hours. CBG: No results for input(s): GLUCAP in the last 168 hours. Lipid Profile: No results for input(s): CHOL, HDL, LDLCALC, TRIG, CHOLHDL, LDLDIRECT in the last 72 hours. Thyroid Function Tests: No results for input(s): TSH,  T4TOTAL, FREET4, T3FREE, THYROIDAB in the last 72 hours. Anemia Panel: No results for input(s): VITAMINB12, FOLATE, FERRITIN, TIBC, IRON, RETICCTPCT in the last 72 hours. Sepsis Labs: No results for input(s): PROCALCITON, LATICACIDVEN in the last 168 hours.  No results found for this or any previous visit (from the past 240 hour(s)).       Radiology Studies: Dg Chest 2 View  Result Date: 06/15/2016 CLINICAL DATA:  Stroke.  History of hypertension. EXAM: CHEST  2 VIEW COMPARISON:  09/26/2011 FINDINGS: The heart size appears mildly enlarged. There is aortic atherosclerosis. Small pleural effusions and pulmonary vascular congestion noted. No superimposed airspace consolidation identified. IMPRESSION: 1. Small pleural effusions and pulmonary vascular congestion. 2. Aortic atherosclerosis and mild cardiac enlargement. Electronically Signed   By: Kerby Moors M.D.   On: 06/15/2016 12:14   Ct Head Wo Contrast  Result Date: 06/14/2016 CLINICAL DATA:  Increase confusion EXAM: CT HEAD WITHOUT CONTRAST TECHNIQUE: Contiguous axial images were obtained from the base of the skull through the vertex without intravenous contrast. COMPARISON:  January 03, 2007 FINDINGS: Brain: There is  decreased density of left frontal gray and white matter new since prior exam. There is low density involving the superior right frontal lobe new since the prior exam. There is low density involving the bilateral occipital lobes unchanged compared to prior exam, consistent with old infarct. Chronic bilateral periventricular white matter small vessel ischemic changes are noted. There is no midline shift or hydrocephalus. No acute hemorrhage is identified. Vascular: The vessels are unchanged. Skull: There is no acute fracture or dislocation. Sinuses/Orbits: There is a small retention cyst in the left maxillary sinus. The sinuses are otherwise clear. The orbits are normal. IMPRESSION: Decreased density involving the gray-white matter of  bilateral frontal lobes new since prior head CT of January 03, 2007. Subacute infarct in the left frontal lobe is not excluded. The finding in the right frontal lobe is probably due to old infarct. Suggest further evaluation with MR brain. 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 These results will be called to the ordering clinician or representative by the Radiologist Assistant, and communication documented in the PACS or zVision Dashboard. Electronically Signed   By: Abelardo Diesel M.D.   On: 06/14/2016 16:00   Mr Brain Wo Contrast  Result Date: 06/15/2016 CLINICAL DATA:  Stroke.  Increasing confusion EXAM: MRI HEAD WITHOUT CONTRAST MRA HEAD WITHOUT CONTRAST TECHNIQUE: Multiplanar, multiecho pulse sequences of the brain and surrounding structures were obtained without intravenous contrast. Angiographic images of the head were obtained using MRA technique without contrast. COMPARISON:  CT 06/14/2016 FINDINGS: MRI HEAD FINDINGS Image quality degraded by mild motion. Extensive atrophy with diffuse cerebral volume loss and ventricular enlargement. Chronic microvascular ischemic changes in the white matter, thalamus, and pons, and cerebellum bilaterally. Negative for acute infarct. Negative for intracranial hemorrhage. Negative for mass or edema. No shift of the midline structures. Paranasal sinuses clear. No orbital mass lesion. Pituitary not enlarged. MRA HEAD FINDINGS Suboptimal image quality due to motion. Left vertebral artery patent to the basilar. Right vertebral artery does not contribute to the basilar and may be congenitally hypoplastic distally. Right vertebral artery may end in PICA. Mild irregularity and stenosis proximal basilar. Posterior cerebral arteries patent bilaterally with fetal origin on the right. Moderate stenosis of the left posterior cerebral artery and mild stenosis right posterior cerebral artery. Cavernous carotid patent bilaterally. Anterior and middle cerebral arteries patent  bilaterally. Severe stenosis/ occlusion of the lateral branch of the right middle cerebral artery likely supplying the temporal lobe. Negative for aneurysm IMPRESSION: Advanced atrophy.  Chronic microvascular ischemic changes Negative for acute infarct Severe stenosis/ occluded segment of lateral branch of the right middle cerebral artery. Moderate stenosis left posterior cerebral artery and mild stenosis right posterior cerebral artery. Mild to moderate stenosis proximal basilar artery. Electronically Signed   By: Franchot Gallo M.D.   On: 06/15/2016 11:12   Mr Jodene Nam Head/brain ZO Cm  Result Date: 06/15/2016 CLINICAL DATA:  Stroke.  Increasing confusion EXAM: MRI HEAD WITHOUT CONTRAST MRA HEAD WITHOUT CONTRAST TECHNIQUE: Multiplanar, multiecho pulse sequences of the brain and surrounding structures were obtained without intravenous contrast. Angiographic images of the head were obtained using MRA technique without contrast. COMPARISON:  CT 06/14/2016 FINDINGS: MRI HEAD FINDINGS Image quality degraded by mild motion. Extensive atrophy with diffuse cerebral volume loss and ventricular enlargement. Chronic microvascular ischemic changes in the white matter, thalamus, and pons, and cerebellum bilaterally. Negative for acute infarct. Negative for intracranial hemorrhage. Negative for mass or edema. No shift of the midline structures. Paranasal sinuses clear. No orbital mass lesion. Pituitary  not enlarged. MRA HEAD FINDINGS Suboptimal image quality due to motion. Left vertebral artery patent to the basilar. Right vertebral artery does not contribute to the basilar and may be congenitally hypoplastic distally. Right vertebral artery may end in PICA. Mild irregularity and stenosis proximal basilar. Posterior cerebral arteries patent bilaterally with fetal origin on the right. Moderate stenosis of the left posterior cerebral artery and mild stenosis right posterior cerebral artery. Cavernous carotid patent bilaterally.  Anterior and middle cerebral arteries patent bilaterally. Severe stenosis/ occlusion of the lateral branch of the right middle cerebral artery likely supplying the temporal lobe. Negative for aneurysm IMPRESSION: Advanced atrophy.  Chronic microvascular ischemic changes Negative for acute infarct Severe stenosis/ occluded segment of lateral branch of the right middle cerebral artery. Moderate stenosis left posterior cerebral artery and mild stenosis right posterior cerebral artery. Mild to moderate stenosis proximal basilar artery. Electronically Signed   By: Franchot Gallo M.D.   On: 06/15/2016 11:12        Scheduled Meds: . aspirin  81 mg Oral Daily  . cefTRIAXone (ROCEPHIN)  IV  1 g Intravenous Q24H  . clopidogrel  75 mg Oral Daily  . enoxaparin (LOVENOX) injection  40 mg Subcutaneous Q24H  . furosemide  40 mg Oral BID  . losartan  50 mg Oral Daily  . metoprolol tartrate  25 mg Oral BID  . QUEtiapine  25 mg Oral QHS  . simvastatin  40 mg Oral Daily  . sodium chloride flush  3 mL Intravenous Q12H   Continuous Infusions: . sodium chloride 0.9 % 1,000 mL with potassium chloride 40 mEq infusion 75 mL/hr at 06/15/16 0919     LOS: 1 day    Time spent:  59 minutes   Eladio Dentremont, MD [facility] Pager [phone]  If 7PM-7AM, please contact night-coverage [email or web address] Password [facility] 06/15/2016, 3:35 PM

## 2016-06-15 NOTE — Evaluation (Addendum)
Physical Therapy Evaluation Patient Details Name: [name] MRN: [id number] DOB: 04-04-1933 Today's Date: 06/15/2016   History of Present Illness  82 y.o. female with medical history significant of dementia, CHF, CAD, HTN, CVA who was brought to the emergency department by her husband and niece due to worsening dementia  Clinical Impression  Patient demonstrates deficits in functional mobility as indicated below. Will need continued skilled PT to address deficits and maximize function. Will see as indicated and progress as tolerated. Will need ST SNF upon acute discharge.   Follow Up Recommendations SNF;Supervision/Assistance - 24 hour    Equipment Recommendations  None recommended by PT    Recommendations for Other Services       Precautions / Restrictions Precautions Precautions: Fall Restrictions Weight Bearing Restrictions: No      Mobility  Bed Mobility Overal bed mobility: Needs Assistance Bed Mobility: Sit to Supine       Sit to supine: Mod assist   General bed mobility comments: moderate assist to return to bed, increased assist for repositioning in bed  Transfers Overall transfer level: Needs assistance Equipment used: Rolling walker (2 wheeled) Transfers: Sit to/from [employer] Sit to Stand: Min assist Stand pivot transfers: Min assist       General transfer comment: SPT to transfer to bed from [facility]. Min assist to elevate to standing and +2 min assist for safety and stability in standing with use of RW  Ambulation/Gait Ambulation/Gait assistance: Min assist;+2 physical assistance Ambulation Distance (Feet): 90 Feet Assistive device: Rolling walker (2 wheeled) Gait Pattern/deviations: Step-through pattern;Decreased stride length;Shuffle;Staggering right;Drifts right/left;Trunk flexed;Narrow base of support Gait velocity: decreased Gait velocity interpretation: Below normal speed for age/gender General Gait Details: patient with  poor positioning with RW despite cues for safety, increased assist for stability bilaterally, assist to manage patient and control RW. Patient continuously lists to the right despite multimodal cues to position. Increased instability noted  Stairs            Wheelchair Mobility    Modified Rankin (Stroke Patients Only)       Balance Overall balance assessment: History of Falls                                           Pertinent Vitals/Pain Pain Assessment: No/denies pain    Home Living Family/patient expects to be discharged to:: Private residence Living Arrangements: Spouse/significant other   Type of Home: House Home Access: Stairs to enter   [employer] of Steps: 1 Home Layout: One level Home Equipment: Environmental consultant - 4 wheels;Bedside commode      Prior Function Level of Independence: Needs assistance   Gait / Transfers Assistance Needed: rollator for mobility  ADL's / Homemaking Assistance Needed: husband helps minimally with ADL        Hand Dominance   Dominant Hand: Right    Extremity/Trunk Assessment   Upper Extremity Assessment: Generalized weakness           Lower Extremity Assessment: Generalized weakness      Cervical / Trunk Assessment: Kyphotic  Communication   Communication: No difficulties  Cognition Arousal/Alertness: Awake/alert Behavior During Therapy: WFL for tasks assessed/performed Overall Cognitive Status: History of cognitive impairments - at baseline                      General Comments      Exercises  Assessment/Plan    PT Assessment Patient needs continued PT services  PT Diagnosis Difficulty walking;Abnormality of gait;Generalized weakness;Altered mental status   PT Problem List Decreased strength;Decreased activity tolerance;Decreased balance;Decreased mobility;Decreased coordination;Decreased cognition;Decreased safety awareness  PT Treatment Interventions DME  instruction;Gait training;Functional mobility training;Therapeutic activities;Therapeutic exercise;Balance training;Patient/family education   PT Goals (Current goals can be found in the Care Plan section) Acute Rehab PT Goals Patient Stated Goal: to go home PT Goal Formulation: With patient Time For Goal Achievement: 06/29/16 Potential to Achieve Goals: Fair    Frequency Min 2X/week   Barriers to discharge Decreased caregiver support      Co-evaluation               End of Session Equipment Utilized During Treatment: Gait belt Activity Tolerance: Patient tolerated treatment well Patient left: in bed;with call bell/phone within reach;with bed alarm set Nurse Communication: Mobility status         Time: 2130-86571412-1438 PT Time Calculation (min) (ACUTE ONLY): 26 min   Charges:   PT Evaluation $PT Eval Moderate Complexity: 1 Procedure     PT G CodesFabio Asa:        Brinley Treanor J 06/15/2016, 3:32 PM Charlotte Crumbevon Jamauri Kruzel, PT DPT  [phone]     06/15/16 1415  PT G-Codes **NOT FOR INPATIENT CLASS**  Functional Assessment Tool Used clinical judgement  Functional Limitation Mobility: Walking and moving around  Mobility: Walking and Moving Around Current Status (B2841(G8978) CJ  Mobility: Walking and Moving Around Goal Status (L2440(G8979) CI

## 2016-06-15 NOTE — Progress Notes (Signed)
Spoke with husband at length on the phone, pt has been having cognitive issues for greater than 6 months he states.  Husband is blind and works outside the home at [employer]. Husband states he will not let her cook due to safety reasons, pt will not allow a caregiver to come into the home to help.  Husband spoke with PT & OT on the phone also during the time nursing was speaking with him.  Therapy agree pt is not safe to be at home alone, husband agrees that she is not safe at home, and he feels she needs to go to a place that can help her get better. He is unable to care for 24hr/day.   [place] has nieces [place]   SW consult in, informed husband that SW should get in touch with him tomorrow to discuss pt needs.

## 2016-06-15 NOTE — Evaluation (Signed)
Occupational Therapy Evaluation Patient Details Name: [name] MRN: [id number] DOB: 02-Dec-1932 Today's Date: 06/15/2016    History of Present Illness 82 y.o. female with medical history significant of dementia, CHF, CAD, HTN, CVA who was brought to the emergency department by her husband and niece due to worsening dementia   Clinical Impression   Pt reports she was independent with ADL PTA; although pt is a poor historian. Currently pt requires min assist for functional mobility and min assist for ADL with max verbal cues for sequencing and initiation. Recommending SNF for follow up in order to maximize independence and safety with ADL and functional mobility prior to return home. Pt would benefit from continued skilled OT to address established goals.     Follow Up Recommendations  SNF;Supervision/Assistance - 24 hour    Equipment Recommendations  Other (comment) (TBD at next venue)    Recommendations for Other Services       Precautions / Restrictions Precautions Precautions: Fall Restrictions Weight Bearing Restrictions: No      Mobility Bed Mobility Overal bed mobility: Needs Assistance Bed Mobility: Sit to Supine       Sit to supine: Mod assist   General bed mobility comments: moderate assist to return to bed, increased assist for repositioning in bed  Transfers Overall transfer level: Needs assistance Equipment used: Rolling walker (2 wheeled) Transfers: Sit to/from [employer] Sit to Stand: Min assist Stand pivot transfers: Min assist       General transfer comment: SPT to transfer [facility] bed from [facility]. Min assist to elevate to standing and +2 min assist for safety and stability in standing with use of RW    Balance Overall balance assessment: History of Falls                                          ADL Overall ADL's : Needs assistance/impaired Eating/Feeding: Set up;Supervision/ safety;Bed level   Grooming: Set  up;Supervision/safety;Sitting;Cueing for sequencing   Upper Body Bathing: Set up;Supervision/ safety;Sitting;Cueing for sequencing   Lower Body Bathing: Minimal assistance;Sit to/from stand   Upper Body Dressing : Set up;Supervision/safety;Sitting;Cueing for sequencing Upper Body Dressing Details (indicate cue type and reason): to don/doff hospital gown Lower Body Dressing: Minimal assistance;Sit to/from stand   Toilet Transfer: Minimal assistance;Ambulation;RW;BSC;Cueing for sequencing;Cueing for safety Toilet Transfer Details (indicate cue type and reason): Simulated by sit to stand from EOB with functional mobility.       Tub/Shower Transfer Details (indicate cue type and reason): Attempted tub transfer. Pt unable to sequence with max verbal cues and support for balance. Functional mobility during ADLs: Minimal assistance;Rolling walker;Cueing for safety;Cueing for sequencing General ADL Comments: Pt noted to be walking on R side of RW despite max verbal cues. Pt with possible R inattention noted during functional activities.      Vision Additional Comments: Needs further assessment. R inattention?   Perception     Praxis      Pertinent Vitals/Pain Pain Assessment: No/denies pain     Hand Dominance Right   Extremity/Trunk Assessment Upper Extremity Assessment Upper Extremity Assessment: Generalized weakness   Lower Extremity Assessment Lower Extremity Assessment: Generalized weakness   Cervical / Trunk Assessment Cervical / Trunk Assessment: Kyphotic   Communication Communication Communication: No difficulties   Cognition Arousal/Alertness: Awake/alert Behavior During Therapy: WFL for tasks assessed/performed Overall Cognitive Status: History of cognitive impairments - at baseline  General Comments       Exercises       Shoulder Instructions      Home Living Family/patient expects to be discharged to:: Private residence Living  Arrangements: Spouse/significant other   Type of Home: House Home Access: Stairs to enter [employer]ance Stairs-Number of Steps: 1   Home Layout: One level     Bathroom Shower/Tub: Tub/shower unit Shower/tub characteristics: Curtain FirefighterBathroom Toilet: Handicapped height     Home Equipment: Environmental consultantWalker - 4 wheels;Bedside commode          Prior Functioning/Environment Level of Independence: Needs assistance  Gait / Transfers Assistance Needed: rollator for mobility ADL's / Homemaking Assistance Needed: husband helps minimally with ADL        OT Diagnosis: Generalized weakness;Cognitive deficits   OT Problem List: Decreased strength;Decreased range of motion;Impaired balance (sitting and/or standing);Impaired vision/perception;Decreased coordination;Decreased cognition;Decreased safety awareness;Decreased knowledge of use of DME or AE;Decreased knowledge of precautions   OT Treatment/Interventions: Self-care/ADL training;Energy conservation;DME and/or AE instruction;Therapeutic activities;Patient/family education;Balance training    OT Goals(Current goals can be found in the care plan section) Acute Rehab OT Goals Patient Stated Goal: to go home OT Goal Formulation: With patient Time For Goal Achievement: 06/29/16 Potential to Achieve Goals: Good ADL Goals Pt Will Perform Grooming: with supervision;standing Pt Will Perform Upper Body Bathing: with supervision;sitting Pt Will Perform Lower Body Bathing: with supervision;sit to/from stand Pt Will Transfer to Toilet: with supervision;ambulating;bedside commode Pt Will Perform Toileting - Clothing Manipulation and hygiene: with supervision;sit to/from stand  OT Frequency: Min 2X/week   Barriers to D/C: Decreased caregiver support  husband works during the day       Co-evaluation PT/OT/SLP Co-Evaluation/Treatment: Yes Reason for Co-Treatment: Complexity of the patient's impairments (multi-system involvement);Necessary to address  cognition/behavior during functional activity;For patient/therapist safety PT goals addressed during session: Mobility/safety with mobility OT goals addressed during session: ADL's and self-care      End of Session Equipment Utilized During Treatment: Gait belt;Rolling walker Nurse Communication: Mobility status  Activity Tolerance: Patient tolerated treatment well Patient left: in bed;with call bell/phone within reach;with bed alarm set   Time: [id number] OT Time Calculation (min): 26 min Charges:  OT General Charges $OT Visit: 1 Procedure OT Evaluation $OT Eval Moderate Complexity: 1 Procedure G-Codes:     Gaye AlkenBailey A Emmerich Cryer M.S., OTR/L Pager: [phone]  06/15/2016, 3:37 PM

## 2016-06-15 NOTE — Progress Notes (Signed)
Pt has been refusing to have labs drawn. Lab has attempted several times without success. Pt did go down for MRI this am.  Pt keeps stating " I'm going home, don't need any more test done".

## 2016-06-15 NOTE — Progress Notes (Signed)
Pt refusing to allow nursing staff to get a clean catch urine or I & O cath to obtain urine for urine culture.

## 2016-06-15 NOTE — Consult Note (Signed)
Initial Neurological Consultation                      NEURO HOSPITALIST CONSULT NOTE   Requestig physician: Dr. Janee Mornhompson   Reason for Consult:  Altered mental status. CT with possible new cerebral infarction.   HPI:                                                                                                                                          [name] is an 82 year old patient who was admitted to the hospital yesterday with confusion and disorientation. She has a known history of dementia as well as a number of other medical conditions including prior stroke. The family members noted that she was confused and having trouble managing her medications. They did not appreciate any focal neurological complaints. CT of the head suggests the possibility of a new low-density area in the frontal region possibly an acute or subacute infarction. A subsequent MRI revealed no evidence of acute stroke. On admission she was found to have a urinary tract infection as well as hypokalemia. Premature ventricular contractions were seen on EKG.   Past Medical History:  Diagnosis Date  . CHF (congestive heart failure) (HCC)   . Coronary artery disease   . Hypertension   . 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 No pertinent surgical history.  MEDICATIONS:                                                                                                                     I have reviewed the patient's current medications.  Allergies  Allergen Reactions  . Tetracyclines & Related Rash     Social History:  reports that she has never smoked. She has never used smokeless tobacco. She reports that she does not drink alcohol or use drugs.  Family History  Problem Relation Age of Onset  . Heart disease Mother      ROS:  History obtained from chart  review  General ROS: negative for - chills, fatigue, fever, night sweats, weight gain or weight loss Psychological ROS: negative for - behavioral disorder, hallucinations, memory difficulties, mood swings or suicidal ideation Ophthalmic ROS: negative for - blurry vision, double vision, eye pain or loss of vision ENT ROS: negative for - epistaxis, nasal discharge, oral lesions, sore throat, tinnitus or vertigo Allergy and Immunology ROS: negative for - hives or itchy/watery eyes Hematological and Lymphatic ROS: negative for - bleeding problems, bruising or swollen lymph nodes Endocrine ROS: negative for - galactorrhea, hair pattern changes, polydipsia/polyuria or temperature intolerance Respiratory ROS: negative for - cough, hemoptysis, shortness of breath or wheezing Cardiovascular ROS: negative for - chest pain, dyspnea on exertion, edema or irregular heartbeat Gastrointestinal ROS: negative for - abdominal pain, diarrhea, hematemesis, nausea/vomiting or stool incontinence Genito-Urinary ROS: negative for - dysuria, hematuria, incontinence or urinary frequency/urgency Musculoskeletal ROS: negative for - joint swelling or muscular weakness Neurological ROS: as noted in HPI Dermatological ROS: negative for rash and skin lesion changes   General Exam                                                                                                      Blood pressure (!) 182/78, pulse 79, temperature 97.5 F (36.4 C), temperature source Oral, resp. rate 20, height 5\' 5"  (1.651 m), weight 85.2 kg (187 lb 12.8 oz), SpO2 100 %. HEENT-  Normocephalic, no lesions, without obvious abnormality.  Normal external eye and conjunctiva.  Normal TM's bilaterally.  Normal auditory canals and external ears. Normal external nose, mucus membranes and septum.  Normal pharynx. Cardiovascular- regular rate and rhythm, S1, S2 normal, no murmur, click, rub or gallop, pulses palpable throughout   Lungs- chest clear, no  wheezing, rales, normal symmetric air entry, Heart exam - S1, S2 normal, no murmur, no gallop, rate regular Abdomen- soft, non-tender; bowel sounds normal; no masses,  no organomegaly Extremities- less then 2 second capillary refill   Neurological Examination Mental Status: Alert and oriented, following commands appropriately. She does reasonably well on Mini-Mental status testing. Cranial Nerves: II: Discs flat bilaterally; Visual fields grossly normal, pupils equal, round, reactive to light and accommodation III,IV, VI: ptosis not present, extra-ocular motions intact bilaterally V,VII: smile symmetric, facial light touch sensation normal bilaterally VIII: hearing normal bilaterally IX,X: uvula rises symmetrically XI: bilateral shoulder shrug XII: midline tongue extension Motor: Right : Upper extremity   5/5    Left:     Upper extremity   5/5  Lower extremity   5/5     Lower extremity   5/5 Tone and bulk:normal tone throughout; no atrophy noted Sensory: Pinprick and light touch intact throughout, bilaterally Deep Tendon Reflexes: 2+ and symmetric throughout Plantars: Right: downgoing   Left: downgoing Cerebellar: normal finger-to-nose, normal rapid alternating movements and normal heel-to-shin test    Lab Results: Basic Metabolic Panel:  Recent Labs Lab 06/14/16 1524  NA 145  K 3.0*  CL 113*  CO2 26  GLUCOSE 105*  BUN 10  CREATININE 0.86  CALCIUM 9.1  Liver Function Tests:  Recent Labs Lab 06/14/16 1524  AST 15  ALT 11*  ALKPHOS 64  BILITOT 0.8  PROT 6.2*  ALBUMIN 3.4*   No results for input(s): LIPASE, AMYLASE in the last 168 hours. No results for input(s): AMMONIA in the last 168 hours.  CBC:  Recent Labs Lab 06/14/16 1406  WBC 4.5  HGB 12.1  HCT 39.0  MCV 92.4  PLT 206    Cardiac Enzymes: No results for input(s): CKTOTAL, CKMB, CKMBINDEX, TROPONINI in the last 168 hours.  Lipid Panel: No results for input(s): CHOL, TRIG, HDL, CHOLHDL,  VLDL, LDLCALC in the last 168 hours.  CBG: No results for input(s): GLUCAP in the last 168 hours.  Microbiology: Results for orders placed or performed during the hospital encounter of 09/26/11  Culture, blood (routine x 2)     Status: None   Collection Time: 09/26/11  6:40 PM  Result Value Ref Range Status   Specimen Description BLOOD RIGHT WRIST  Final   Special Requests BOTTLES DRAWN AEROBIC AND ANAEROBIC   Final   Culture  Setup Time [id number]  Final   Culture NO GROWTH 5 DAYS  Final   Report Status 10/02/2011 FINAL  Final  Culture, blood (routine x 2)     Status: None   Collection Time: 09/26/11  7:07 PM  Result Value Ref Range Status   Specimen Description BLOOD LEFT HAND  Final   Special Requests BOTTLES DRAWN AEROBIC AND ANAEROBIC  Final   Culture  Setup Time [id number]  Final   Culture NO GROWTH 5 DAYS  Final   Report Status 10/02/2011 FINAL  Final  Urine culture     Status: None   Collection Time: 09/26/11  7:55 PM  Result Value Ref Range Status   Specimen Description URINE, CATHETERIZED  Final   Special Requests Normal  Final   Culture  Setup Time [id number]  Final   Colony Count 10,000 COLONIES/ML  Final   Culture ESCHERICHIA COLI  Final   Report Status 09/28/2011 FINAL  Final   Organism ID, Bacteria ESCHERICHIA COLI  Final      Susceptibility   Escherichia coli - MIC*    AMPICILLIN <=2 Sensitive     CEFAZOLIN <=4 Sensitive     CEFTRIAXONE <=1 Sensitive     CIPROFLOXACIN <=0.25 Sensitive     GENTAMICIN <=1 Sensitive     LEVOFLOXACIN <=0.12 Sensitive     NITROFURANTOIN <=16 Sensitive     TOBRAMYCIN <=1 Sensitive     TRIMETH/SULFA <=20 Sensitive     * ESCHERICHIA COLI    Coagulation Studies: No results for input(s): LABPROT, INR in the last 72 hours.  Imaging: Ct Head Wo Contrast  Result Date: 06/14/2016 CLINICAL DATA:  Increase confusion EXAM: CT HEAD WITHOUT CONTRAST TECHNIQUE: Contiguous axial images were obtained from the base of the skull  through the vertex without intravenous contrast. COMPARISON:  January 03, 2007 FINDINGS: Brain: There is decreased density of left frontal gray and white matter new since prior exam. There is low density involving the superior right frontal lobe new since the prior exam. There is low density involving the bilateral occipital lobes unchanged compared to prior exam, consistent with old infarct. Chronic bilateral periventricular white matter small vessel ischemic changes are noted. There is no midline shift or hydrocephalus. No acute hemorrhage is identified. Vascular: The vessels are unchanged. Skull: There is no acute fracture or dislocation. Sinuses/Orbits: There is a small retention cyst in the left maxillary sinus.  The sinuses are otherwise clear. The orbits are normal. IMPRESSION: Decreased density involving the gray-white matter of bilateral frontal lobes new since prior head CT of January 03, 2007. Subacute infarct in the left frontal lobe is not excluded. The finding in the right frontal lobe is probably due to old infarct. Suggest further evaluation with MR brain. 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 These results will be called to the ordering clinician or representative by the Radiologist Assistant, and communication documented in the PACS or zVision Dashboard. Electronically Signed   By: Sherian Rein M.D.   On: 06/14/2016 16:00   Mr Brain Wo Contrast  Result Date: 06/15/2016 CLINICAL DATA:  Stroke.  Increasing confusion EXAM: MRI HEAD WITHOUT CONTRAST MRA HEAD WITHOUT CONTRAST TECHNIQUE: Multiplanar, multiecho pulse sequences of the brain and surrounding structures were obtained without intravenous contrast. Angiographic images of the head were obtained using MRA technique without contrast. COMPARISON:  CT 06/14/2016 FINDINGS: MRI HEAD FINDINGS Image quality degraded by mild motion. Extensive atrophy with diffuse cerebral volume loss and ventricular enlargement. Chronic microvascular ischemic  changes in the white matter, thalamus, and pons, and cerebellum bilaterally. Negative for acute infarct. Negative for intracranial hemorrhage. Negative for mass or edema. No shift of the midline structures. Paranasal sinuses clear. No orbital mass lesion. Pituitary not enlarged. MRA HEAD FINDINGS Suboptimal image quality due to motion. Left vertebral artery patent to the basilar. Right vertebral artery does not contribute to the basilar and may be congenitally hypoplastic distally. Right vertebral artery may end in PICA. Mild irregularity and stenosis proximal basilar. Posterior cerebral arteries patent bilaterally with fetal origin on the right. Moderate stenosis of the left posterior cerebral artery and mild stenosis right posterior cerebral artery. Cavernous carotid patent bilaterally. Anterior and middle cerebral arteries patent bilaterally. Severe stenosis/ occlusion of the lateral branch of the right middle cerebral artery likely supplying the temporal lobe. Negative for aneurysm IMPRESSION: Advanced atrophy.  Chronic microvascular ischemic changes Negative for acute infarct Severe stenosis/ occluded segment of lateral branch of the right middle cerebral artery. Moderate stenosis left posterior cerebral artery and mild stenosis right posterior cerebral artery. Mild to moderate stenosis proximal basilar artery. Electronically Signed   By: Marlan Palau M.D.   On: 06/15/2016 11:12   Mr Maxine Glenn Head/brain ZO Cm  Result Date: 06/15/2016 CLINICAL DATA:  Stroke.  Increasing confusion EXAM: MRI HEAD WITHOUT CONTRAST MRA HEAD WITHOUT CONTRAST TECHNIQUE: Multiplanar, multiecho pulse sequences of the brain and surrounding structures were obtained without intravenous contrast. Angiographic images of the head were obtained using MRA technique without contrast. COMPARISON:  CT 06/14/2016 FINDINGS: MRI HEAD FINDINGS Image quality degraded by mild motion. Extensive atrophy with diffuse cerebral volume loss and ventricular  enlargement. Chronic microvascular ischemic changes in the white matter, thalamus, and pons, and cerebellum bilaterally. Negative for acute infarct. Negative for intracranial hemorrhage. Negative for mass or edema. No shift of the midline structures. Paranasal sinuses clear. No orbital mass lesion. Pituitary not enlarged. MRA HEAD FINDINGS Suboptimal image quality due to motion. Left vertebral artery patent to the basilar. Right vertebral artery does not contribute to the basilar and may be congenitally hypoplastic distally. Right vertebral artery may end in PICA. Mild irregularity and stenosis proximal basilar. Posterior cerebral arteries patent bilaterally with fetal origin on the right. Moderate stenosis of the left posterior cerebral artery and mild stenosis right posterior cerebral artery. Cavernous carotid patent bilaterally. Anterior and middle cerebral arteries patent bilaterally. Severe stenosis/ occlusion of the lateral branch of the right middle  cerebral artery likely supplying the temporal lobe. Negative for aneurysm IMPRESSION: Advanced atrophy.  Chronic microvascular ischemic changes Negative for acute infarct Severe stenosis/ occluded segment of lateral branch of the right middle cerebral artery. Moderate stenosis left posterior cerebral artery and mild stenosis right posterior cerebral artery. Mild to moderate stenosis proximal basilar artery. Electronically Signed   By: Marlan Palau M.D.   On: 06/15/2016 11:12    Assessment/Plan:  Joana is a pleasant 82 year old patient was admitted to the hospital for a decline in her cognitive function. She was actually found to have a urinary tract infection. This is already been appropriately addressed. Her cognitive function appears back to baseline at this point. The CT revealed some low-density areas in the frontal region concerning for the possibility of acute to subacute infarction. However, her MRI reveals no evidence of acute stroke. She is  already on appropriate risk factor reduction with aspirin and Plavix and Zocor. The MRA revealed several areas of stenosis. However there is no further intervention indicated for these at this time.  Plan:  1. Neurology will sign off at this point. Please reconsult with any new issues.    Maicie Vanderloop A. Hilda Blades, M.D. Neurohospitalist Phone: [phone]  06/15/2016, 12:14 PM

## 2016-06-15 NOTE — Progress Notes (Signed)
MD placed order for stroke swallow screen this am, pt had stroke swallow screen done yesterday 06/14/16 am and passed. Documented on stroke swallow screen flowsheet. RN will reorder diet. Pt ate breakfast this am without difficulty.

## 2016-06-16 DIAGNOSIS — R4182 Altered mental status, unspecified: Secondary | ICD-10-CM | POA: Diagnosis not present

## 2016-06-16 DIAGNOSIS — G9341 Metabolic encephalopathy: Secondary | ICD-10-CM | POA: Diagnosis not present

## 2016-06-16 DIAGNOSIS — I10 Essential (primary) hypertension: Secondary | ICD-10-CM | POA: Diagnosis not present

## 2016-06-16 DIAGNOSIS — F0391 Unspecified dementia with behavioral disturbance: Secondary | ICD-10-CM | POA: Diagnosis not present

## 2016-06-16 DIAGNOSIS — E876 Hypokalemia: Secondary | ICD-10-CM | POA: Diagnosis not present

## 2016-06-16 LAB — CBC
HEMATOCRIT: 41.3 % (ref 36.0–46.0)
HEMOGLOBIN: 13.1 g/dL (ref 12.0–15.0)
MCH: 28.5 pg (ref 26.0–34.0)
MCHC: 31.7 g/dL (ref 30.0–36.0)
MCV: 89.8 fL (ref 78.0–100.0)
Platelets: 197 10*3/uL (ref 150–400)
RBC: 4.6 MIL/uL (ref 3.87–5.11)
RDW: 14.1 % (ref 11.5–15.5)
WBC: 5.1 10*3/uL (ref 4.0–10.5)

## 2016-06-16 LAB — BASIC METABOLIC PANEL
Anion gap: 9 (ref 5–15)
BUN: 9 mg/dL (ref 6–20)
CHLORIDE: 102 mmol/L (ref 101–111)
CO2: 29 mmol/L (ref 22–32)
Calcium: 9.1 mg/dL (ref 8.9–10.3)
Creatinine, Ser: 0.94 mg/dL (ref 0.44–1.00)
GFR calc Af Amer: >60 mL/min (ref >60)
GFR, EST NON AFRICAN AMERICAN: 55 mL/min — AB (ref >60)
GLUCOSE: 126 mg/dL — AB (ref 65–99)
POTASSIUM: 3.8 mmol/L (ref 3.5–5.1)
SODIUM: 140 mmol/L (ref 135–145)

## 2016-06-16 LAB — VITAMIN B12: Vitamin B-12: 216 pg/mL (ref 180–914)

## 2016-06-16 MED ORDER — ENSURE ENLIVE PO LIQD: 237.0000 mL | Freq: Two times a day (BID) | ORAL | Status: DC | PRN

## 2016-06-16 MED ORDER — POTASSIUM CHLORIDE CRYS ER 20 MEQ PO TBCR
40.0000 meq | EXTENDED_RELEASE_TABLET | Freq: Once | ORAL | Status: AC
  Administered 2016-06-16: 40 meq via ORAL
  Filled 2016-06-16 (×2): qty 2

## 2016-06-16 MED ORDER — ENSURE ENLIVE PO LIQD
237.0000 mL | ORAL | Status: DC
  Administered 2016-06-16: 237 mL via ORAL

## 2016-06-16 MED ORDER — CYANOCOBALAMIN 1000 MCG/ML IJ SOLN
1000.0000 ug | Freq: Every day | INTRAMUSCULAR | Status: DC
  Administered 2016-06-16: 1000 ug via INTRAMUSCULAR
  Filled 2016-06-16 (×2): qty 1

## 2016-06-16 MED ORDER — CEFTRIAXONE SODIUM 1 G IJ SOLR: 1.0000 g | Freq: Once | INTRAMUSCULAR | Status: DC

## 2016-06-16 NOTE — Progress Notes (Signed)
PROGRESS NOTE    [name]  UJW:[id number] DOB: 1933/03/30 DOA: 06/14/2016 PCP: Lolita Patella, MD   Brief Narrative:  [name] is a 82 y.o. female with medical history significant of dementia, CHF, CAD, HTN, CVA who was brought to the emergency department by her husband and niece due to worsening dementia.  Per family members, they have been noticing that the patient has been having increased confusion, trouble with medications and noncompliance with PCP's appointments for the past few months. Recently, in the past few days, she has been having difficulty with her sleep-wake cycle. She has also become increasingly confused, paranoid and delusional. No fever, no chills, no chest pain, dyspnea, lower extremity edema, abdominal pain, diarrhea or constipation. She denies GU symptoms.   ED Course: The patient received IV Rocephin and potassium replacement in the emergency department. Workup showed hypokalemia 3.0 mmol/L, abnormal UA consistent with UTI, EKG with multiple PVCs, possible subacute infarct in the left frontal lobe on CT of the brain.    Assessment & Plan:   Principal Problem:   Metabolic encephalopathy Active Problems:   UTI (urinary tract infection)   Stroke (cerebrum) (HCC)   Hypertension   Hyperlipidemia   Hypokalemia   Dementia   #1 metabolic encephalopathy Patient presented with worsening confusion and cognitive decline which have been ongoing for the past 6 months. Concern for worsening dementia versus secondary to UTI in the setting of dementia. Urinalysis obtained was concerning for UTI. Urine cultures with greater than 100,000 Klebsiella pneumonia. Chest x-ray negative for any acute infiltrate. Patient is afebrile. Head CT was concerning for possible subacute stroke however MRI of the head was negative for any acute infarction. B-12 levels at 216 and other low side of normal. RBC folate pending. Check HIV and RPR. Patient has a seen in  consultation by neurology who feel no further neurological workup is needed at this time. Continue empiric IV Rocephin while urine sensitivities are pending. Place on B-12 IM injections. Supportive care.  #2 urinary tract infection Urine cultures with greater than 100,000 Klebsiella pneumonia. Sensitivities pending. IV Rocephin.  #3 hypokalemia Patient was given some potassium supplementation yesterday. Potassium has been repleted.   #4 history of CVA Continue aspirin and Plavix for secondary stroke prevention.  #5 hypertension Continue Lasix, Cozaar, metoprolol.  #6 dementia Patient with history of underlying dementia with symptoms which seem to be worsened over the past 6 months per nursing discussion with patient's husband. Patient also noted to have some agitation. Urinalysis is worrisome for UTI and urine cultures with greater than 100,000 Klebsiella pneumonia. Sensitivities pending. B-12 levels and low normal at 216. RBC folate pending. Check HIV and RPR. Due to agitation yesterday patient received IV Ativan as well as Seroquel at bedtime. Continue IV Rocephin. Will place on B-12 IM daily 1 week and then weekly times one month and then monthly. Patient will likely need to follow-up with neurology in the outpatient setting.   DVT prophylaxis: Lovenox Code Status: Full Family Communication: Updated patient. No family at bedside. Disposition Plan: Skilled nursing facility   Consultants:   Neurology: Dr. Hilda Blades 06/15/2016  Procedures:  CT head 06/14/2016  MRI/MRA brain 06/15/2016  Chest x-ray 06/15/2016  Antimicrobials:   IV Rocephin 06/14/2016   Subjective: Patient agitated yesterday and had to receive IV Ativan. Patient states she is going home and is refusing skilled nursing facility stating she can take care of herself at home. Patient refused blood draws this morning however agreed for blood  to be drawn this afternoon which it was. Patient denies any  shortness of breath. No chest pain.   Objective: Vitals:   06/16/16 0546 06/16/16 1015 06/16/16 1326 06/16/16 1818  BP: (!) 149/65 (!) 144/73 127/61 (!) 119/46  Pulse: (!) 57 80 88 63  Resp: 20 16 20 16   Temp: 97.8 F (36.6 C) 97.5 F (36.4 C) 97.9 F (36.6 C) 98.4 F (36.9 C)  TempSrc: Oral Axillary Oral Oral  SpO2: 100% 96% 95% 90%  Weight:      Height:        Intake/Output Summary (Last 24 hours) at 06/16/16 1924 Last data filed at 06/16/16 1818  Gross per 24 hour  Intake           1832.5 ml  Output                0 ml  Net           1832.5 ml   Filed Weights   06/14/16 2000  Weight: 85.2 kg (187 lb 12.8 oz)    Examination:  General exam: Sitting up in chair eating lunch. Respiratory system: Clear to auscultation. Respiratory effort normal. Cardiovascular system: S1 & S2 heard, RRR. No JVD, murmurs, rubs, gallops or clicks. No pedal edema. Gastrointestinal system: Abdomen is nondistended, soft and nontender. No organomegaly or masses felt. Normal bowel sounds heard. Central nervous system: Alert and oriented. No focal neurological deficits. Extremities: Symmetric 5 x 5 power. Skin: No rashes, lesions or ulcers Psychiatry: Judgement and insight appear poor. Mood & affect appropriate.     Data Reviewed: I have personally reviewed following labs and imaging studies  CBC:  Recent Labs Lab 06/14/16 1406 06/16/16 1040  WBC 4.5 5.1  HGB 12.1 13.1  HCT 39.0 41.3  MCV 92.4 89.8  PLT 206 197   Basic Metabolic Panel:  Recent Labs Lab 06/14/16 1524 06/16/16 1040  NA 145 140  K 3.0* 3.8  CL 113* 102  CO2 26 29  GLUCOSE 105* 126*  BUN 10 9  CREATININE 0.86 0.94  CALCIUM 9.1 9.1   GFR: Estimated Creatinine Clearance: 50.6 mL/min (by C-G formula based on SCr of 0.94 mg/dL). Liver Function Tests:  Recent Labs Lab 06/14/16 1524  AST 15  ALT 11*  ALKPHOS 64  BILITOT 0.8  PROT 6.2*  ALBUMIN 3.4*   No results for input(s): LIPASE, AMYLASE in the  last 168 hours. No results for input(s): AMMONIA in the last 168 hours. Coagulation Profile: No results for input(s): INR, PROTIME in the last 168 hours. Cardiac Enzymes: No results for input(s): CKTOTAL, CKMB, CKMBINDEX, TROPONINI in the last 168 hours. BNP (last 3 results) No results for input(s): PROBNP in the last 8760 hours. HbA1C: No results for input(s): HGBA1C in the last 72 hours. CBG: No results for input(s): GLUCAP in the last 168 hours. Lipid Profile: No results for input(s): CHOL, HDL, LDLCALC, TRIG, CHOLHDL, LDLDIRECT in the last 72 hours. Thyroid Function Tests: No results for input(s): TSH, T4TOTAL, FREET4, T3FREE, THYROIDAB in the last 72 hours. Anemia Panel:  Recent Labs  06/16/16 1040  VITAMINB12 216   Sepsis Labs: No results for input(s): PROCALCITON, LATICACIDVEN in the last 168 hours.  Recent Results (from the past 240 hour(s))  Urine culture     Status: Abnormal (Preliminary result)   Collection Time: 06/15/16 12:41 PM  Result Value Ref Range Status   Specimen Description URINE, CATHETERIZED  Final   Special Requests NONE  Final   Culture >=  100,000 COLONIES/mL KLEBSIELLA PNEUMONIAE (A)  Final   Report Status PENDING  Incomplete         Radiology Studies: Dg Chest 2 View  Result Date: 06/15/2016 CLINICAL DATA:  Stroke.  History of hypertension. EXAM: CHEST  2 VIEW COMPARISON:  09/26/2011 FINDINGS: The heart size appears mildly enlarged. There is aortic atherosclerosis. Small pleural effusions and pulmonary vascular congestion noted. No superimposed airspace consolidation identified. IMPRESSION: 1. Small pleural effusions and pulmonary vascular congestion. 2. Aortic atherosclerosis and mild cardiac enlargement. Electronically Signed   By: Signa Kellaylor  Stroud M.D.   On: 06/15/2016 12:14   Mr Brain Wo Contrast  Result Date: 06/15/2016 CLINICAL DATA:  Stroke.  Increasing confusion EXAM: MRI HEAD WITHOUT CONTRAST MRA HEAD WITHOUT CONTRAST TECHNIQUE:  Multiplanar, multiecho pulse sequences of the brain and surrounding structures were obtained without intravenous contrast. Angiographic images of the head were obtained using MRA technique without contrast. COMPARISON:  CT 06/14/2016 FINDINGS: MRI HEAD FINDINGS Image quality degraded by mild motion. Extensive atrophy with diffuse cerebral volume loss and ventricular enlargement. Chronic microvascular ischemic changes in the white matter, thalamus, and pons, and cerebellum bilaterally. Negative for acute infarct. Negative for intracranial hemorrhage. Negative for mass or edema. No shift of the midline structures. Paranasal sinuses clear. No orbital mass lesion. Pituitary not enlarged. MRA HEAD FINDINGS Suboptimal image quality due to motion. Left vertebral artery patent to the basilar. Right vertebral artery does not contribute to the basilar and may be congenitally hypoplastic distally. Right vertebral artery may end in PICA. Mild irregularity and stenosis proximal basilar. Posterior cerebral arteries patent bilaterally with fetal origin on the right. Moderate stenosis of the left posterior cerebral artery and mild stenosis right posterior cerebral artery. Cavernous carotid patent bilaterally. Anterior and middle cerebral arteries patent bilaterally. Severe stenosis/ occlusion of the lateral branch of the right middle cerebral artery likely supplying the temporal lobe. Negative for aneurysm IMPRESSION: Advanced atrophy.  Chronic microvascular ischemic changes Negative for acute infarct Severe stenosis/ occluded segment of lateral branch of the right middle cerebral artery. Moderate stenosis left posterior cerebral artery and mild stenosis right posterior cerebral artery. Mild to moderate stenosis proximal basilar artery. Electronically Signed   By: Marlan Palauharles  Clark M.D.   On: 06/15/2016 11:12   Mr Maxine GlennMra Head/brain WUWo Cm  Result Date: 06/15/2016 CLINICAL DATA:  Stroke.  Increasing confusion EXAM: MRI HEAD WITHOUT  CONTRAST MRA HEAD WITHOUT CONTRAST TECHNIQUE: Multiplanar, multiecho pulse sequences of the brain and surrounding structures were obtained without intravenous contrast. Angiographic images of the head were obtained using MRA technique without contrast. COMPARISON:  CT 06/14/2016 FINDINGS: MRI HEAD FINDINGS Image quality degraded by mild motion. Extensive atrophy with diffuse cerebral volume loss and ventricular enlargement. Chronic microvascular ischemic changes in the white matter, thalamus, and pons, and cerebellum bilaterally. Negative for acute infarct. Negative for intracranial hemorrhage. Negative for mass or edema. No shift of the midline structures. Paranasal sinuses clear. No orbital mass lesion. Pituitary not enlarged. MRA HEAD FINDINGS Suboptimal image quality due to motion. Left vertebral artery patent to the basilar. Right vertebral artery does not contribute to the basilar and may be congenitally hypoplastic distally. Right vertebral artery may end in PICA. Mild irregularity and stenosis proximal basilar. Posterior cerebral arteries patent bilaterally with fetal origin on the right. Moderate stenosis of the left posterior cerebral artery and mild stenosis right posterior cerebral artery. Cavernous carotid patent bilaterally. Anterior and middle cerebral arteries patent bilaterally. Severe stenosis/ occlusion of the lateral branch of the right middle  cerebral artery likely supplying the temporal lobe. Negative for aneurysm IMPRESSION: Advanced atrophy.  Chronic microvascular ischemic changes Negative for acute infarct Severe stenosis/ occluded segment of lateral branch of the right middle cerebral artery. Moderate stenosis left posterior cerebral artery and mild stenosis right posterior cerebral artery. Mild to moderate stenosis proximal basilar artery. Electronically Signed   By: Marlan Palau M.D.   On: 06/15/2016 11:12        Scheduled Meds: . aspirin  81 mg Oral Daily  . cefTRIAXone  (ROCEPHIN)  IV  1 g Intravenous Q24H  . clopidogrel  75 mg Oral Daily  . cyanocobalamin  1,000 mcg Intramuscular Daily  . enoxaparin (LOVENOX) injection  40 mg Subcutaneous Q24H  . feeding supplement (ENSURE ENLIVE)  237 mL Oral Q24H  . furosemide  40 mg Oral BID  . losartan  50 mg Oral Daily  . metoprolol tartrate  25 mg Oral BID  . QUEtiapine  25 mg Oral QHS  . simvastatin  40 mg Oral Daily  . sodium chloride flush  3 mL Intravenous Q12H   Continuous Infusions: . sodium chloride 0.9 % 1,000 mL with potassium chloride 40 mEq infusion Stopped (06/15/16 2105)     LOS: 2 days    Time spent:  35 minutes   Lennis Korb, MD [facility] Pager [phone]  If 7PM-7AM, please contact night-coverage [email or web address] Password [facility] 06/16/2016, 7:24 PM

## 2016-06-16 NOTE — Evaluation (Signed)
Speech Language Pathology Evaluation Patient Details Name: [name] MRN: [id number] DOB: Feb 21, 1933 Today's Date: 06/16/2016 Time: [id number] SLP Time Calculation (min) (ACUTE ONLY): 20 min  Problem List:  Patient Active Problem List   Diagnosis Date Noted  . Metabolic encephalopathy 06/15/2016  . 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 Hypertension 06/14/2016  . Hyperlipidemia 06/14/2016  . Hypokalemia 06/14/2016  . UTI (urinary tract infection) 06/14/2016  . Dementia 06/14/2016   Past Medical History:  Past Medical History:  Diagnosis Date  . CHF (congestive heart failure) (HCC)   . Coronary artery disease   . Hypertension   . Stroke (cerebrum) (HCC) 06/14/2016   Past Surgical History: History reviewed. No pertinent surgical history. HPI:  82 y.o. female with medical history significant of dementia, CHF, CAD, HTN, CVA who was brought to the emergency department by her husband and niece due to worsening dementia   Assessment / Plan / Recommendation Clinical Impression  Pt has significant difficulties with sustained attention, working memory, and Research scientist (physical sciences)storage/retrieval of new information. She was unable to recall what she had just eaten for lunch despite Max cues and binary choices. She often deflects questions and instructions for tasks when they are challenging. Mod cues provided for basic verbal problem solving. Although no family is present to determine what her baseline level of function, per chart review she has been experiencing cognitive decline over the last several months. Pt will need additional SLP f/u and 24/7 supervision, likely best achieved at SNF.    SLP Assessment  Patient needs continued Speech Lanaguage Pathology Services    Follow Up Recommendations  Skilled Nursing facility    Frequency and Duration min 2x/week  2 weeks      SLP Evaluation Prior Functioning  Cognitive/Linguistic Baseline: Baseline deficits Baseline deficit details: reported baseline  dementia but no caregiver present to provide clear history   Cognition  Overall Cognitive Status: No family/caregiver present to determine baseline cognitive functioning Arousal/Alertness: Awake/alert Orientation Level: Oriented to person;Oriented to place;Oriented to time;Disoriented to situation Attention: Sustained Sustained Attention: Impaired Sustained Attention Impairment: Verbal basic Memory: Impaired Memory Impairment: Storage deficit;Retrieval deficit;Decreased recall of new information Awareness: Impaired Awareness Impairment: Intellectual impairment;Emergent impairment;Anticipatory impairment Problem Solving: Impaired Problem Solving Impairment: Verbal basic Safety/Judgment: Impaired    Comprehension  Auditory Comprehension Overall Auditory Comprehension: Impaired Commands: Impaired One Step Basic Commands: 75-100% accurate    Expression Expression Primary Mode of Expression: Verbal Verbal Expression Overall Verbal Expression: Impaired Naming: Impairment Responsive: 0-25% accurate Divergent: 0-24% accurate   Oral / Motor  Motor Speech Overall Motor Speech: Appears within functional limits for tasks assessed   GO                    [name], [name] 06/16/2016, 5:02 PM  [name], M.A. CCC-SLP [phone]

## 2016-06-16 NOTE — NC FL2 (Signed)
[place] MEDICAID FL2 LEVEL OF CARE SCREENING TOOL     IDENTIFICATION  Patient Name: [name] Birthdate: 03-Apr-1933 Sex: female Admission Date (Current Location): 06/14/2016  [facility] [place] [place]   Facility and Address:  [facility] [place], [address], [place], [place] [address]      Provider Number: [id number]  Attending Physician Name and Address:  Rodolph Bonganiel V Thompson, MD  Relative Name and Phone Number:       Current Level of Care: Hospital Recommended Level of Care: Skilled Nursing Facility Prior Approval Number:    Date Approved/Denied:   PASRR Number: [id number]  Discharge Plan: SNF    Current Diagnoses: Patient Active Problem List   Diagnosis Date Noted  . Metabolic encephalopathy 06/15/2016  . 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 Hypertension 06/14/2016  . Hyperlipidemia 06/14/2016  . Hypokalemia 06/14/2016  . UTI (urinary tract infection) 06/14/2016  . Dementia 06/14/2016    Orientation RESPIRATION BLADDER Height & Weight     Self, Time, Place  Normal Incontinent Weight: 187 lb 12.8 oz (85.2 kg) (Simultaneous filing. User may not have seen previous data.) Height:  5\' 5"  (165.1 cm)  BEHAVIORAL SYMPTOMS/MOOD NEUROLOGICAL BOWEL NUTRITION STATUS      Incontinent Diet (Heart Healthy, Thin Liquids)  AMBULATORY STATUS COMMUNICATION OF NEEDS Skin   Limited Assist Verbally Normal                       Personal Care Assistance Level of Assistance  Bathing, Dressing, Feeding Bathing Assistance: Limited assistance Feeding assistance: Limited assistance Dressing Assistance: Limited assistance     Functional Limitations Info  Sight, Hearing, Speech Sight Info: Adequate Hearing Info: Adequate Speech Info: Adequate    SPECIAL CARE FACTORS FREQUENCY                       Contractures Contractures Info: Not present    Additional Factors Info  Code Status, Allergies, Psychotropic Code Status  Info: Full Code Allergies Info: Allergies: Tetracyclines & Related Psychotropic Info: Medications         Current Medications (06/16/2016):  This is the current hospital active medication list Current Facility-Administered Medications  Medication Dose Route Frequency Provider Last Rate Last Dose  . ALPRAZolam Prudy Feeler(XANAX) tablet 0.25 mg  0.25 mg Oral BID PRN Bobette Moavid Manuel Ortiz, MD   0.25 mg at 06/15/16 2117  . aspirin chewable tablet 81 mg  81 mg Oral Daily Bobette Moavid Manuel Ortiz, MD   81 mg at 06/16/16 0948  . cefTRIAXone (ROCEPHIN) 1 g in dextrose 5 % 50 mL IVPB  1 g Intravenous Q24H Bobette Moavid Manuel Ortiz, MD   1 g at 06/15/16 1539  . clopidogrel (PLAVIX) tablet 75 mg  75 mg Oral Daily Bobette Moavid Manuel Ortiz, MD   75 mg at 06/16/16 0948  . enoxaparin (LOVENOX) injection 40 mg  40 mg Subcutaneous Q24H Bobette Moavid Manuel Ortiz, MD   40 mg at 06/15/16 2123  . furosemide (LASIX) tablet 40 mg  40 mg Oral BID Bobette Moavid Manuel Ortiz, MD   40 mg at 06/16/16 [id number]  . LORazepam (ATIVAN) injection 0.25 mg  0.25 mg Intravenous Q8H PRN Rodolph Bonganiel V Thompson, MD   0.25 mg at 06/15/16 1539  . losartan (COZAAR) tablet 50 mg  50 mg Oral Daily Bobette Moavid Manuel Ortiz, MD   50 mg at 06/16/16 0948  . metoprolol tartrate (LOPRESSOR) tablet 25 mg  25 mg Oral BID Bobette Moavid Manuel Ortiz,  MD   25 mg at 06/16/16 0948  . ondansetron (ZOFRAN) tablet 4 mg  4 mg Oral Q6H PRN Bobette Mo, MD       Or  . ondansetron [facility]) injection 4 mg  4 mg Intravenous Q6H PRN Bobette Mo, MD      . QUEtiapine (SEROQUEL) tablet 25 mg  25 mg Oral QHS Rodolph Bong, MD   25 mg at 06/15/16 2117  . simvastatin (ZOCOR) tablet 40 mg  40 mg Oral Daily Bobette Mo, MD   40 mg at 06/16/16 0948  . sodium chloride 0.9 % 1,000 mL with potassium chloride 40 mEq infusion   Intravenous Continuous Rodolph Bong, MD   Stopped at 06/15/16 2105  . sodium chloride flush (NS) 0.9 % injection 3 mL  3 mL Intravenous Q12H Bobette Mo, MD         Discharge  Medications: Please see discharge summary for a list of discharge medications.  Relevant Imaging Results:  Relevant Lab Results:   Additional Information SSN:  [id number]  Dede Query, LCSW

## 2016-06-16 NOTE — Progress Notes (Signed)
Initial Nutrition Assessment  DOCUMENTATION CODES:   Obesity unspecified  INTERVENTION:  Provide Ensure Enlive po once daily and BID PRN, each supplement provides 350 kcal and 20 grams of protein Monitor PO intake for adequacy   NUTRITION DIAGNOSIS:   Inadequate oral intake related to lethargy/confusion as evidenced by meal completion < 50%, mild depletion of body fat, mild depletion of muscle mass.   GOAL:   Patient will meet greater than or equal to 90% of their needs   MONITOR:   PO intake, Supplement acceptance, Labs, Weight trends, Skin, I & O's  REASON FOR ASSESSMENT:   Malnutrition Screening Tool    ASSESSMENT:   82 y.o. female with medical history significant of dementia, CHF, CAD, HTN, CVA who was brought to the emergency department by her husband and niece due to worsening dementia.  Pt states that her appetite is good, but if she is not hungry she is not going to eat. Per nursing notes pt ate 25% of breakfast and 50% of lunch. Pt states that she ate 100% of breakfast yesterday. She reports drinking Ensure occasionally PTA. States that she usually weighs 200 lbs. RD offered Ensure at time of visit, but pt declined and asked for orange juice instead. RD encouraged PO intake. Pt agreeable to receiving Ensure at night.   Labs reviewed.   Diet Order:  Diet Heart Room service appropriate? Yes; Fluid consistency: Thin  Skin:  Reviewed, no issues  Last BM:  8/25  Height:   Ht Readings from Last 1 Encounters:  06/14/16 5\' 5"  (1.651 m)    Weight:   Wt Readings from Last 1 Encounters:  06/14/16 187 lb 12.8 oz (85.2 kg)    Ideal Body Weight:  56.82 kg  BMI:  Body mass index is 31.25 kg/m.  Estimated Nutritional Needs:   Kcal:  1550-1750  Protein:  75-90 grams  Fluid:  1.5-1.7 L/day  EDUCATION NEEDS:   No education needs identified at this time  Dorothea Ogleeanne Loie Jahr RD, LDN, CSP Inpatient Clinical Dietitian Pager: [phone] After Hours Pager:  [phone]

## 2016-06-17 DIAGNOSIS — E538 Deficiency of other specified B group vitamins: Secondary | ICD-10-CM

## 2016-06-17 DIAGNOSIS — E785 Hyperlipidemia, unspecified: Secondary | ICD-10-CM | POA: Diagnosis not present

## 2016-06-17 DIAGNOSIS — F0391 Unspecified dementia with behavioral disturbance: Secondary | ICD-10-CM | POA: Diagnosis not present

## 2016-06-17 DIAGNOSIS — R4182 Altered mental status, unspecified: Secondary | ICD-10-CM | POA: Diagnosis not present

## 2016-06-17 DIAGNOSIS — G9341 Metabolic encephalopathy: Secondary | ICD-10-CM | POA: Diagnosis not present

## 2016-06-17 LAB — CBC
HCT: 41.9 % (ref 36.0–46.0)
Hemoglobin: 13.2 g/dL (ref 12.0–15.0)
MCH: 28.3 pg (ref 26.0–34.0)
MCHC: 31.5 g/dL (ref 30.0–36.0)
MCV: 89.7 fL (ref 78.0–100.0)
PLATELETS: 276 10*3/uL (ref 150–400)
RBC: 4.67 MIL/uL (ref 3.87–5.11)
RDW: 14.2 % (ref 11.5–15.5)
WBC: 6.7 10*3/uL (ref 4.0–10.5)

## 2016-06-17 LAB — URINE CULTURE: Culture: >=100000 — AB

## 2016-06-17 LAB — HIV ANTIBODY (ROUTINE TESTING W REFLEX): HIV Screen 4th Generation wRfx: NONREACTIVE

## 2016-06-17 LAB — FOLATE RBC
FOLATE, RBC: 826 ng/mL (ref >498)
Folate, Hemolysate: 326.2 ng/mL
Hematocrit: 39.5 % (ref 34.0–46.6)

## 2016-06-17 LAB — BASIC METABOLIC PANEL
ANION GAP: 11 (ref 5–15)
BUN: 24 mg/dL — ABNORMAL HIGH (ref 6–20)
CALCIUM: 9.2 mg/dL (ref 8.9–10.3)
CO2: 26 mmol/L (ref 22–32)
CREATININE: 0.98 mg/dL (ref 0.44–1.00)
Chloride: 102 mmol/L (ref 101–111)
GFR calc Af Amer: >60 mL/min (ref >60)
GFR, EST NON AFRICAN AMERICAN: 53 mL/min — AB (ref >60)
GLUCOSE: 119 mg/dL — AB (ref 65–99)
Potassium: 4.2 mmol/L (ref 3.5–5.1)
Sodium: 139 mmol/L (ref 135–145)

## 2016-06-17 LAB — RPR: RPR Ser Ql: NONREACTIVE

## 2016-06-17 MED ORDER — FUROSEMIDE 40 MG PO TABS: 40.0000 mg | ORAL_TABLET | Freq: Every day | ORAL | Status: DC

## 2016-06-17 MED ORDER — QUETIAPINE FUMARATE 25 MG PO TABS: 25.0000 mg | ORAL_TABLET | Freq: Every day | ORAL | 0 refills | Status: AC

## 2016-06-17 MED ORDER — CEFUROXIME AXETIL 250 MG PO TABS: 250.0000 mg | ORAL_TABLET | Freq: Two times a day (BID) | ORAL | 0 refills | Status: AC

## 2016-06-17 MED ORDER — ALPRAZOLAM 0.25 MG PO TABS: 0.2500 mg | ORAL_TABLET | Freq: Two times a day (BID) | ORAL | 0 refills | Status: DC | PRN

## 2016-06-17 MED ORDER — CYANOCOBALAMIN 1000 MCG/ML IJ SOLN: 1000.0000 ug | Freq: Every day | INTRAMUSCULAR | 0 refills | Status: AC

## 2016-06-17 MED ORDER — ENSURE ENLIVE PO LIQD: 237.0000 mL | Freq: Two times a day (BID) | ORAL | 12 refills | Status: DC

## 2016-06-17 NOTE — Clinical Social Work Note (Signed)
LATE ENTRY  left message for pt's husband as requested on 06/16/2016, requesting a return phone call. Pt's husband called back. CSW explained nature of consult, PT's recommendation of SNF. Pt's husband shared that he would like to meet with CSW between 2 and 2:45 PM to address discharge plan. Pt's husband was agreeable to SNF search for placement at discharge. CSW will continue to follow.   Kristine QuerySarah Yitta Mcclain, MSW, LCSW  Clinical Social Worker [phone]

## 2016-06-17 NOTE — Clinical Social Work Placement (Signed)
   CLINICAL SOCIAL WORK PLACEMENT  NOTE  Date:  06/17/2016  Patient Details  Name: [name] MRN: [id number] Date of Birth: 02-03-1933  Clinical Social Work is seeking post-discharge placement for this patient at the Skilled  Nursing Facility level of care (*CSW will initial, date and re-position this form in  chart as items are completed):  Yes   Patient/family provided with [place] Clinical Social Work Department's list of facilities offering this level of care within the geographic area requested by the patient (or if unable, by the patient's family).  Yes   Patient/family informed of their freedom to choose among providers that offer the needed level of care, that participate in Medicare, Medicaid or managed care program needed by the patient, have an available bed and are willing to accept the patient.  Yes   Patient/family informed of [facility]'s ownership interest in [facility] and [facility], as well as of the fact that they are under no obligation to receive care at these facilities.  PASRR submitted to EDS on       PASRR number received on       Existing PASRR number confirmed on 06/17/16     FL2 transmitted to all facilities in geographic area requested by pt/family on 06/17/16     FL2 transmitted to all facilities within larger geographic area on       Patient informed that his/her managed care company has contracts with or will negotiate with certain facilities, including the following:        Yes   Patient/family informed of bed offers received.  [facility]     Physician recommends and patient chooses bed at      Patient to be [facility] to [facility] on 06/17/16.  Patient to be transferred to facility by [facility]     Patient family notified on 06/17/16 of transfer.  Name of family member notified:  Pt's son and husband     PHYSICIAN       Additional  Comment:    _______________________________________________ Dede QuerySarah Jeneal Vogl, LCSW 06/17/2016, 9:03 PM

## 2016-06-17 NOTE — Discharge Summary (Signed)
Physician Discharge Summary  [name] ZOX:[id number] DOB: Jul 29, 1933 DOA: 06/14/2016  PCP: Lolita Patella, MD  Admit date: 06/14/2016 Discharge date: 06/17/2016  Time spent: 65 minutes  Recommendations for Outpatient Follow-up:  1. Discharged to skilled nursing facility. Patient will follow-up with M.D. at the skilled nursing facility. Patient will need a basic metabolic profile done in 1 week to follow-up on electrolytes and renal function. 2. Patient may benefit from a referral to neurology in the outpatient setting for further evaluation and management of dementia.   Discharge Diagnoses:  Principal Problem:   Metabolic encephalopathy Active Problems:   UTI (urinary tract infection)   Stroke (cerebrum) (HCC)   Hypertension   Hyperlipidemia   Hypokalemia   Dementia   B12 deficiency   Discharge Condition: Stable and improved  Diet recommendation: Heart healthy  Filed Weights   06/14/16 2000  Weight: 85.2 kg (187 lb 12.8 oz)    History of present illness:  Per Dr Heloise Ochoa is a 82 y.o. female with medical history significant of dementia, CHF, CAD, HTN, CVA who was brought to the emergency department by her husband and niece due to worsening dementia.  Per family members, they have been noticing that the patient has been having increased confusion, trouble with medications and noncompliance with PCP's appointments for the past few months. Recently, in the past few days, she has been having difficulty with her sleep-wake cycle. She has also become increasingly confused, paranoid and delusional. No fever, no chills, no chest pain, dyspnea, lower extremity edema, abdominal pain, diarrhea or constipation. She denied GU symptoms.   ED Course: The patient received IV Rocephin and potassium replacement in the emergency department. Workup showed hypokalemia 3.0 mmol/L, abnormal UA consistent with UTI, EKG with multiple PVCs, possible subacute infarct in  the left frontal lobe on CT of the brain.   Hospital Course:  #1 metabolic encephalopathy Patient presented with worsening confusion and cognitive decline which have been ongoing for the past 6 months. Concern for worsening dementia versus secondary to UTI in the setting of dementia and B-12 deficiency. Urinalysis obtained was concerning for UTI. Urine cultures with greater than 100,000 Klebsiella pneumonia. Chest x-ray negative for any acute infiltrate. Patient is afebrile. Head CT was concerning for possible subacute stroke however MRI of the head was negative for any acute infarction. B-12 levels at 216, on low side of normal. RBC folate within normal limits. HIV and RPR were nonreactive.  Patient has a seen in consultation by neurology who felt, no further neurological workup was needed. Patient was placed empirically on IV Rocephin and started on B-12 injections. Patient improved clinically and patient will be discharged to skilled nursing facility in stable condition. Patient be discharged with 4 more days of oral Ceftin as well as B-12 supplementation.  #2 urinary tract infection Urine cultures with greater than 100,000 Klebsiella pneumonia. Urine cultures are sensitive to cephalosporins. Patient was placed on IV Rocephin. Patient will be discharged on 4 more days of oral Ceftin to complete a course of antibiotic treatment.   #3 hypokalemia Patient's potassium was repleted.   #4 history of CVA Continued on aspirin and Plavix for secondary stroke prevention.  #5 hypertension Continued on Lasix, Cozaar, metoprolol.  #6 dementia Patient with history of underlying dementia with symptoms which seem to be worsened over the past 6 months per nursing discussion with patient's husband. Patient also noted to have some agitation. Urinalysis was worrisome for UTI and urine cultures with greater than 100,000  Klebsiella pneumonia. Sensitive to cephalosporins, fluoroquinolones and Bactrim as well  as gentamicin. B-12 levels and low normal at 216. RBC folate within normal limits. HIV and RPR nonreactive. . Due to agitation patient was placed on Ativan as well as Seroquel at bedtime with some improvement with her agitation. Patient was also maintained on IV Rocephin. Patient was started on vitamin B-12 1000 MCG's IM daily. Patient will follow-up with M.D. at skilled nursing facility and may benefit from outpatient referral to neurology.    Procedures:  CT head 06/14/2016  MRI/MRA brain 06/15/2016  Chest x-ray 06/15/2016   Consultations:  Neurology: Dr. Hilda BladesArmstrong 06/15/2016  Discharge Exam: Vitals:   06/17/16 1355 06/17/16 1706  BP: (!) 118/54 128/61  Pulse: 74 70  Resp:  16  Temp: 98.2 F (36.8 C) 97.9 F (36.6 C)    General: NAD Cardiovascular: RRR Respiratory: CTAB  Discharge Instructions   Discharge Instructions    Diet - low sodium heart healthy    Complete by:  As directed   Increase activity slowly    Complete by:  As directed     Current Discharge Medication List    START taking these medications   Details  ALPRAZolam (XANAX) 0.25 MG tablet Take 1 tablet (0.25 mg total) by mouth 2 (two) times daily as needed for anxiety or sleep (May be used on call to MRI suite.). Qty: 15 tablet, Refills: 0    cefUROXime (CEFTIN) 250 MG tablet Take 1 tablet (250 mg total) by mouth 2 (two) times daily with a meal. Take for 4 days then stop. Qty: 8 tablet, Refills: 0    cyanocobalamin (,VITAMIN B-12,) 1000 MCG/ML injection Inject 1 mL (1,000 mcg total) into the skin daily. Take 1000mcg SQ daily x 1 week, then 1000mcg sq weekly x 1 month, then 1000mcg  SQ monthly. Qty: 25 mL, Refills: 0    feeding supplement, ENSURE ENLIVE, (ENSURE ENLIVE) LIQD Take 237 mLs by mouth 2 (two) times daily between meals. Qty: 237 mL, Refills: 12    QUEtiapine (SEROQUEL) 25 MG tablet Take 1 tablet (25 mg total) by mouth at bedtime. Qty: 30 tablet, Refills: 0      CONTINUE these  medications which have CHANGED   Details  furosemide (LASIX) 40 MG tablet Take 1 tablet (40 mg total) by mouth daily. Qty: 30 tablet      CONTINUE these medications which have NOT CHANGED   Details  aspirin 81 MG chewable tablet Chew 81 mg by mouth daily.      clopidogrel (PLAVIX) 75 MG tablet Take 75 mg by mouth daily.      losartan (COZAAR) 50 MG tablet Take 50 mg by mouth daily.      metoprolol tartrate (LOPRESSOR) 25 MG tablet Take 25 mg by mouth 2 (two) times daily.      simvastatin (ZOCOR) 40 MG tablet Take 40 mg by mouth daily.         Allergies  Allergen Reactions  . Tetracyclines & Related Rash   Follow-up Information    MD AT SNF .   Why:  F/U WITH MD AT SNF           The results of significant diagnostics from this hospitalization (including imaging, microbiology, ancillary and laboratory) are listed below for reference.    Significant Diagnostic Studies: Dg Chest 2 View  Result Date: 06/15/2016 CLINICAL DATA:  Stroke.  History of hypertension. EXAM: CHEST  2 VIEW COMPARISON:  09/26/2011 FINDINGS: The heart size appears mildly enlarged.  There is aortic atherosclerosis. Small pleural effusions and pulmonary vascular congestion noted. No superimposed airspace consolidation identified. IMPRESSION: 1. Small pleural effusions and pulmonary vascular congestion. 2. Aortic atherosclerosis and mild cardiac enlargement. Electronically Signed   By: Signa Kell M.D.   On: 06/15/2016 12:14   Ct Head Wo Contrast  Result Date: 06/14/2016 CLINICAL DATA:  Increase confusion EXAM: CT HEAD WITHOUT CONTRAST TECHNIQUE: Contiguous axial images were obtained from the base of the skull through the vertex without intravenous contrast. COMPARISON:  January 03, 2007 FINDINGS: Brain: There is decreased density of left frontal gray and white matter new since prior exam. There is low density involving the superior right frontal lobe new since the prior exam. There is low density involving  the bilateral occipital lobes unchanged compared to prior exam, consistent with old infarct. Chronic bilateral periventricular white matter small vessel ischemic changes are noted. There is no midline shift or hydrocephalus. No acute hemorrhage is identified. Vascular: The vessels are unchanged. Skull: There is no acute fracture or dislocation. Sinuses/Orbits: There is a small retention cyst in the left maxillary sinus. The sinuses are otherwise clear. The orbits are normal. IMPRESSION: Decreased density involving the gray-white matter of bilateral frontal lobes new since prior head CT of January 03, 2007. Subacute infarct in the left frontal lobe is not excluded. The finding in the right frontal lobe is probably due to old infarct. Suggest further evaluation with MR brain. 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 These results will be called to the ordering clinician or representative by the Radiologist Assistant, and communication documented in the PACS or zVision Dashboard. Electronically Signed   By: Sherian Rein M.D.   On: 06/14/2016 16:00   Mr Brain Wo Contrast  Result Date: 06/15/2016 CLINICAL DATA:  Stroke.  Increasing confusion EXAM: MRI HEAD WITHOUT CONTRAST MRA HEAD WITHOUT CONTRAST TECHNIQUE: Multiplanar, multiecho pulse sequences of the brain and surrounding structures were obtained without intravenous contrast. Angiographic images of the head were obtained using MRA technique without contrast. COMPARISON:  CT 06/14/2016 FINDINGS: MRI HEAD FINDINGS Image quality degraded by mild motion. Extensive atrophy with diffuse cerebral volume loss and ventricular enlargement. Chronic microvascular ischemic changes in the white matter, thalamus, and pons, and cerebellum bilaterally. Negative for acute infarct. Negative for intracranial hemorrhage. Negative for mass or edema. No shift of the midline structures. Paranasal sinuses clear. No orbital mass lesion. Pituitary not enlarged. MRA HEAD FINDINGS  Suboptimal image quality due to motion. Left vertebral artery patent to the basilar. Right vertebral artery does not contribute to the basilar and may be congenitally hypoplastic distally. Right vertebral artery may end in PICA. Mild irregularity and stenosis proximal basilar. Posterior cerebral arteries patent bilaterally with fetal origin on the right. Moderate stenosis of the left posterior cerebral artery and mild stenosis right posterior cerebral artery. Cavernous carotid patent bilaterally. Anterior and middle cerebral arteries patent bilaterally. Severe stenosis/ occlusion of the lateral branch of the right middle cerebral artery likely supplying the temporal lobe. Negative for aneurysm IMPRESSION: Advanced atrophy.  Chronic microvascular ischemic changes Negative for acute infarct Severe stenosis/ occluded segment of lateral branch of the right middle cerebral artery. Moderate stenosis left posterior cerebral artery and mild stenosis right posterior cerebral artery. Mild to moderate stenosis proximal basilar artery. Electronically Signed   By: Marlan Palau M.D.   On: 06/15/2016 11:12   Mr Maxine Glenn Head/brain WU Cm  Result Date: 06/15/2016 CLINICAL DATA:  Stroke.  Increasing confusion EXAM: MRI HEAD WITHOUT CONTRAST MRA HEAD WITHOUT  CONTRAST TECHNIQUE: Multiplanar, multiecho pulse sequences of the brain and surrounding structures were obtained without intravenous contrast. Angiographic images of the head were obtained using MRA technique without contrast. COMPARISON:  CT 06/14/2016 FINDINGS: MRI HEAD FINDINGS Image quality degraded by mild motion. Extensive atrophy with diffuse cerebral volume loss and ventricular enlargement. Chronic microvascular ischemic changes in the white matter, thalamus, and pons, and cerebellum bilaterally. Negative for acute infarct. Negative for intracranial hemorrhage. Negative for mass or edema. No shift of the midline structures. Paranasal sinuses clear. No orbital mass lesion.  Pituitary not enlarged. MRA HEAD FINDINGS Suboptimal image quality due to motion. Left vertebral artery patent to the basilar. Right vertebral artery does not contribute to the basilar and may be congenitally hypoplastic distally. Right vertebral artery may end in PICA. Mild irregularity and stenosis proximal basilar. Posterior cerebral arteries patent bilaterally with fetal origin on the right. Moderate stenosis of the left posterior cerebral artery and mild stenosis right posterior cerebral artery. Cavernous carotid patent bilaterally. Anterior and middle cerebral arteries patent bilaterally. Severe stenosis/ occlusion of the lateral branch of the right middle cerebral artery likely supplying the temporal lobe. Negative for aneurysm IMPRESSION: Advanced atrophy.  Chronic microvascular ischemic changes Negative for acute infarct Severe stenosis/ occluded segment of lateral branch of the right middle cerebral artery. Moderate stenosis left posterior cerebral artery and mild stenosis right posterior cerebral artery. Mild to moderate stenosis proximal basilar artery. Electronically Signed   By: Marlan Palau M.D.   On: 06/15/2016 11:12    Microbiology: Recent Results (from the past 240 hour(s))  Urine culture     Status: Abnormal   Collection Time: 06/15/16 12:41 PM  Result Value Ref Range Status   Specimen Description URINE, CATHETERIZED  Final   Special Requests NONE  Final   Culture >=100,000 COLONIES/mL KLEBSIELLA PNEUMONIAE (A)  Final   Report Status 06/17/2016 FINAL  Final   Organism ID, Bacteria KLEBSIELLA PNEUMONIAE (A)  Final      Susceptibility   Klebsiella pneumoniae - MIC*    AMPICILLIN 16 RESISTANT Resistant     CEFAZOLIN <=4 SENSITIVE Sensitive     CEFTRIAXONE <=1 SENSITIVE Sensitive     CIPROFLOXACIN <=0.25 SENSITIVE Sensitive     GENTAMICIN <=1 SENSITIVE Sensitive     IMIPENEM <=0.25 SENSITIVE Sensitive     NITROFURANTOIN 64 INTERMEDIATE Intermediate     TRIMETH/SULFA <=20  SENSITIVE Sensitive     AMPICILLIN/SULBACTAM 4 SENSITIVE Sensitive     PIP/TAZO <=4 SENSITIVE Sensitive     Extended ESBL NEGATIVE Sensitive     * >=100,000 COLONIES/mL KLEBSIELLA PNEUMONIAE     Labs: Basic Metabolic Panel:  Recent Labs Lab 06/14/16 1524 06/16/16 1040 06/17/16 0545  NA 145 140 139  K 3.0* 3.8 4.2  CL 113* 102 102  CO2 26 29 26   GLUCOSE 105* 126* 119*  BUN 10 9 24*  CREATININE 0.86 0.94 0.98  CALCIUM 9.1 9.1 9.2   Liver Function Tests:  Recent Labs Lab 06/14/16 1524  AST 15  ALT 11*  ALKPHOS 64  BILITOT 0.8  PROT 6.2*  ALBUMIN 3.4*   No results for input(s): LIPASE, AMYLASE in the last 168 hours. No results for input(s): AMMONIA in the last 168 hours. CBC:  Recent Labs Lab 06/14/16 1406 06/16/16 1040 06/17/16 0545  WBC 4.5 5.1 6.7  HGB 12.1 13.1 13.2  HCT 39.0 41.3  39.5 41.9  MCV 92.4 89.8 89.7  PLT 206 197 276   Cardiac Enzymes: No results for input(s): CKTOTAL,  CKMB, CKMBINDEX, TROPONINI in the last 168 hours. BNP: BNP (last 3 results) No results for input(s): BNP in the last 8760 hours.  ProBNP (last 3 results) No results for input(s): PROBNP in the last 8760 hours.  CBG: No results for input(s): GLUCAP in the last 168 hours.     SignedRamiro Harvest MD.  [facility] 06/17/2016, 5:13 PM

## 2016-06-17 NOTE — Progress Notes (Signed)
Pt d/c to snf by ambulance. Assessment stable. Unable to call report, no one answered phone at snf

## 2016-06-17 NOTE — Clinical Social Work Note (Signed)
Clinical Social Work Assessment  Patient Details  Name: [name] MRN: [id number] Date of Birth: 27-Mar-1933  Date of referral:  06/17/16               Reason for consult:  Facility Placement                Permission sought to share information with:  Family Supports Permission granted to share information::  Yes, Verbal Permission Granted  Name::     Garnetta Fedrick  Relationship::  husband  Contact Information:  [phone]  Housing/Transportation Living arrangements for the past 2 months:  [place] of Information:  Patient, Adult Children, Spouse Patient Interpreter Needed:  None Criminal Activity/Legal Involvement Pertinent to Current Situation/Hospitalization:  No - Comment as needed Significant Relationships:  Adult Children, Spouse Lives with:  Spouse Do you feel safe going back to the place where you live?  Yes Need for family participation in patient care:  Yes (Comment)  Care giving concerns:  Pt is in need of 24 hour care at home.   Social Worker assessment / plan:  CSW met with pt and family to address consult. Pt was oriented to person and place. CSW introduced herself and explained role of social work. CSW also explained process of discharging to SNF. Pt initially did not want to go to SNF for STR as recommended by PT, however was unable to secure 24 hour care for a safe discharge plan to return home. Pt has a history of falls. Pt's husband and son were supportive of SNF placement. Pt was agreeable after she understood that it was STR and would give it a try. CSW provided bed offers and pt's husband chose [facility]. Facility was able to accept pt as they received discharge information. RN called report and PT[facility] provided transportation. CSW is singing off as no further needs identified.   Employment status:  Retired Nurse, adult PT Recommendations:  [place] / Referral to community resources:   [place]  Patient/Family's Response to care:  Pt's son was Patent attorney of CSW support.   Patient/Family's Understanding of and Emotional Response to Diagnosis, Current Treatment, and Prognosis:  Pt's family understood the need for STR.   Emotional Assessment Appearance:  Appears stated age Attitude/Demeanor/Rapport:  Other (Appropriate) Affect (typically observed):  Blunt Orientation:  Oriented to Self, Fluctuating Orientation (Suspected and/or reported Sundowners), Oriented to Place Alcohol / Substance use:  Never Used Psych involvement (Current and /or in the community):  No (Comment)  Discharge Needs  Concerns to be addressed:  Adjustment to Illness Readmission within the last 30 days:  No Current discharge risk:  Chronically ill Barriers to Discharge:  No Barriers Identified   Darden Dates, LCSW 06/17/2016, 8:53 PM

## 2016-06-17 NOTE — Care Management Important Message (Signed)
Important Message  Patient Details  Name: [name] MRN: [id number] Date of Birth: 01/12/1933   Medicare Important Message Given:  Yes    [name] Stefan ChurchBratton 06/17/2016, 9:48 AM

## 2016-06-17 NOTE — Care Management Note (Signed)
Case Management Note  Patient Details  Name: [name] MRN: [id number] Date of Birth: Jul 30, 1933  Subjective/Objective:     Pt admitted with metabolic encephalopathy. MRI negative but positive for UTI. She is from home with her husband that works.                Action/Plan: PT/OT recommendations are for SNF. CM following for d/c needs.   Expected Discharge Date:                  Expected Discharge Plan:  Skilled Nursing Facility  In-House Referral:     Discharge planning Services     Post Acute Care Choice:    Choice offered to:     DME Arranged:    DME Agency:     HH Arranged:    HH Agency:     Status of Service:  In process, will continue to follow  If discussed at Long Length of Stay Meetings, dates discussed:    Additional Comments:  Kermit BaloKelli F Henrick Mcgue, RN 06/17/2016, 11:32 AM

## 2016-06-19 ENCOUNTER — Non-Acute Institutional Stay (SKILLED_NURSING_FACILITY): Payer: Commercial Managed Care - HMO | Admitting: Adult Health

## 2016-06-19 ENCOUNTER — Encounter: Payer: Self-pay | Admitting: Adult Health

## 2016-06-19 DIAGNOSIS — N3000 Acute cystitis without hematuria: Secondary | ICD-10-CM

## 2016-06-19 DIAGNOSIS — I119 Hypertensive heart disease without heart failure: Secondary | ICD-10-CM

## 2016-06-19 DIAGNOSIS — E785 Hyperlipidemia, unspecified: Secondary | ICD-10-CM

## 2016-06-19 DIAGNOSIS — I639 Cerebral infarction, unspecified: Secondary | ICD-10-CM

## 2016-06-19 DIAGNOSIS — I509 Heart failure, unspecified: Secondary | ICD-10-CM | POA: Diagnosis not present

## 2016-06-19 DIAGNOSIS — E538 Deficiency of other specified B group vitamins: Secondary | ICD-10-CM

## 2016-06-19 DIAGNOSIS — F0151 Vascular dementia with behavioral disturbance: Secondary | ICD-10-CM | POA: Diagnosis not present

## 2016-06-19 DIAGNOSIS — F01518 Vascular dementia, unspecified severity, with other behavioral disturbance: Secondary | ICD-10-CM

## 2016-06-19 NOTE — Progress Notes (Signed)
Patient ID: [name], female   DOB: [birth year], 82 y.o.   MRN: [id number]    Location:   [facility] Room Number: 130-A Place of Service:  SNF (31)   CODE STATUS: Full Code  Allergies  Allergen Reactions  . Tetracyclines & Related Rash    Chief Complaint  Patient presents with  . Hospitalization Follow-up    Hospital follow up    HPI:  She has been hospitalized for altered mental status and uti. Per her medical record she has been experiencing a decline in her overall status at home. She is here for short term rehab; at this time I feel as though this will be a long term placement for her. She tells me that she is feeling good.    Past Medical History:  Diagnosis Date  . CHF (congestive heart failure) (HCC)   . Coronary artery disease   . Hypertension   . 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 No pertinent surgical history.  Social History   Social History  . Marital status: Married    Spouse name: N/A  . Number of children: N/A  . Years of education: N/A   Occupational History  . Not on file.   Social History Main Topics  . Smoking status: Never Smoker  . Smokeless tobacco: Never Used  . Alcohol use No  . Drug use: No  . Sexual activity: Yes    Birth control/ protection: Post-menopausal   Other Topics Concern  . Not on file   Social History Narrative  . No narrative on file   Family History  Problem Relation Age of Onset  . Heart disease Mother       VITAL SIGNS BP (!) 118/57   Pulse 67   Temp 97.2 F (36.2 C) (Oral)   Resp 18   Ht 5\' 5"  (1.651 m)   Wt 138 lb (62.6 kg)   SpO2 96%   BMI 22.96 kg/m   Patient's Medications  New Prescriptions   No medications on file  Previous Medications   ALPRAZOLAM (XANAX) 0.25 MG TABLET    Take 1 tablet (0.25 mg total) by mouth 2 (two) times daily as needed for anxiety or sleep (May be used on call to MRI suite.).   ASPIRIN 81 MG CHEWABLE TABLET    Chew 81 mg by  mouth daily.     CEFUROXIME (CEFTIN) 250 MG TABLET    Take 1 tablet (250 mg total) by mouth 2 (two) times daily with a meal. Take for 4 days then stop.   CLOPIDOGREL (PLAVIX) 75 MG TABLET    Take 75 mg by mouth daily.     CYANOCOBALAMIN (,VITAMIN B-12,) 1000 MCG/ML INJECTION    Inject 1 mL (1,000 mcg total) into the skin daily. Take 1000mcg SQ daily x 1 week, then 1000mcg sq weekly x 1 month, then 1000mcg  SQ monthly.   FEEDING SUPPLEMENT, ENSURE ENLIVE, (ENSURE ENLIVE) LIQD    Take 237 mLs by mouth 2 (two) times daily between meals.   FUROSEMIDE (LASIX) 40 MG TABLET    Take 1 tablet (40 mg total) by mouth daily.   LOSARTAN (COZAAR) 50 MG TABLET    Take 50 mg by mouth daily.     METOPROLOL TARTRATE (LOPRESSOR) 25 MG TABLET    Take 25 mg by mouth 2 (two) times daily.     QUETIAPINE (SEROQUEL) 25 MG TABLET    Take 1 tablet (25 mg total) by mouth  at bedtime.   SIMVASTATIN (ZOCOR) 40 MG TABLET    Take 40 mg by mouth daily.    Modified Medications   No medications on file  Discontinued Medications   No medications on file     SIGNIFICANT DIAGNOSTIC EXAMS  06-14-16: ct of head: Decreased density involving the gray-white matter of bilateral frontal lobes new since prior head CT of January 03, 2007. Subacute infarct in the left frontal lobe is not excluded. The finding in the right frontal lobe is probably due to old infarct. Suggest further evaluation with MR brain. 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    06-15-16: mri/mra of brain: Advanced atrophy.  Chronic microvascular ischemic changes Negative for acute infarct Severe stenosis/ occluded segment of lateral branch of the right middle cerebral artery. Moderate stenosis left posterior cerebral artery and mild stenosis right posterior cerebral artery. Mild to moderate stenosis proximal basilar artery.  06-15-16: chest x-ray; 1. Small pleural effusions and pulmonary vascular congestion. 2. Aortic atherosclerosis and mild cardiac enlargement.       LABS REVIEWED:   06-14-16: wbc 4.5 ;hgb 12.1; hct 39.0; mcv 92.4 ;plt 206; glucose 105; bun 10; creat 0.86; k+ 3.0; na++ 145; liver normal albumin 3.4; urine culture: klebsiella pneumoniae 06-16-16; wbc 5.1; hgb 13.1; hct 41.3; mcv 89.8; plt 197; glucose 126; bun 9; creat 0.94; k+ 3.8; na++ 140; Vit B12 :216 06-17-16: RPR: nr; HIV: nr   Review of Systems  Constitutional: Negative for malaise/fatigue.  Respiratory: Negative for cough and shortness of breath.   Cardiovascular: Negative for chest pain, palpitations and leg swelling.  Gastrointestinal: Negative for abdominal pain, constipation and heartburn.  Musculoskeletal: Negative for back pain, joint pain and myalgias.  Skin: Negative.   Neurological: Negative for dizziness.  Psychiatric/Behavioral: The patient is not nervous/anxious.     Physical Exam  Constitutional: No distress.  Eyes: Conjunctivae are normal.  Neck: Neck supple. No JVD present. No thyromegaly present.  Cardiovascular: Normal rate, regular rhythm and intact distal pulses.   Respiratory: Effort normal and breath sounds normal. No respiratory distress. She has no wheezes.  GI: Soft. Bowel sounds are normal. She exhibits no distension. There is no tenderness.  Musculoskeletal: She exhibits no edema.  Able to move all extremities   Lymphadenopathy:    She has no cervical adenopathy.  Neurological: She is alert.  Skin: Skin is warm and dry. She is not diaphoretic.  Psychiatric: She has a normal mood and affect.      ASSESSMENT/ PLAN:  1. Hypertension: will continue cozaar 50 mg daily and lopressor 25 mg twice daily   2. Vit B12 deficiency: will continue B 12 injections and will monitor her B12 is 216  3. Dyslipidemia; will continue zocor 40 mg daily  4. CHF; will conotinue lasix 40 mg daily is not on k+ supplement  5. CVA; is neurologically stable; will continue plavix 75 mg daily and asa 81 mg daily   6. Vascular dementia: she has had a slow decline  in her level of adl function over the past 6 months; her weight is 138 pounds. She is presently not on medication; will not make changes will monitor  7.  UTI: will complete abt and will monitor her status.   8. Agitation with psychosis: will continue seroquel 25 mg nightly and xanax 0.25 mg twice daily as needed for anxiety    Will check cmp and lipids     MD is aware of resident's narcotic use and is in agreement with current  plan of care. We will attempt to wean resident as apropriate   Synthia Innocent NP [facility]  Contact [phone] Monday through Friday 8am- 5pm  After hours call [phone]

## 2016-06-24 ENCOUNTER — Non-Acute Institutional Stay (SKILLED_NURSING_FACILITY): Payer: Commercial Managed Care - HMO | Admitting: Internal Medicine

## 2016-06-24 ENCOUNTER — Encounter: Payer: Self-pay | Admitting: Internal Medicine

## 2016-06-24 DIAGNOSIS — I509 Heart failure, unspecified: Secondary | ICD-10-CM

## 2016-06-24 DIAGNOSIS — E782 Mixed hyperlipidemia: Secondary | ICD-10-CM

## 2016-06-24 DIAGNOSIS — E538 Deficiency of other specified B group vitamins: Secondary | ICD-10-CM | POA: Diagnosis not present

## 2016-06-24 DIAGNOSIS — I10 Essential (primary) hypertension: Secondary | ICD-10-CM | POA: Diagnosis not present

## 2016-06-24 DIAGNOSIS — F0151 Vascular dementia with behavioral disturbance: Secondary | ICD-10-CM

## 2016-06-24 DIAGNOSIS — F01518 Vascular dementia, unspecified severity, with other behavioral disturbance: Secondary | ICD-10-CM

## 2016-06-24 DIAGNOSIS — N3000 Acute cystitis without hematuria: Secondary | ICD-10-CM

## 2016-06-24 DIAGNOSIS — I679 Cerebrovascular disease, unspecified: Secondary | ICD-10-CM | POA: Diagnosis not present

## 2016-06-24 NOTE — Progress Notes (Deleted)
Patient ID: [name], female   DOB: 03/15/1933, 82 y.o.   MRN: [id number]    HISTORY AND PHYSICAL   DATE: 06/24/2016  Location:    [place] Room Number: 130 A Place of Service: SNF (31)   Extended Emergency Contact Information Primary Emergency Contact: Simmer,Roger Address: [address], [place] Phone: [phone] Relation: Spouse  Advanced Directive information Does patient have an advance directive?: No, Would patient like information on creating an advanced directive?: No - patient declined information  Chief Complaint  Patient presents with  . New Admit To SNF    HPI:  ***  Past Medical History:  Diagnosis Date  . CHF (congestive heart failure) ([place])   . Coronary artery disease   . Hypertension   . Stroke (cerebrum) ([place]) 06/14/2016    History reviewed. No pertinent surgical history.  Patient Care Team: Maury Dus, MD as PCP - General (Family Medicine)  Social History   Social History  . Marital status: Married    Spouse name: N/A  . Number of children: N/A  . Years of education: N/A   Occupational History  . Not on file.   Social History Main Topics  . Smoking status: Never Smoker  . Smokeless tobacco: Never Used  . Alcohol use No  . Drug use: No  . Sexual activity: Yes    Birth control/ protection: Post-menopausal   Other Topics Concern  . Not on file   Social History Narrative  . No narrative on file     reports that she has never smoked. She has never used smokeless tobacco. She reports that she does not drink alcohol or use drugs.  Family History  Problem Relation Age of Onset  . Heart disease Mother    Family Status  Relation Status  . Mother     Immunization History  Administered Date(s) Administered  . PPD Test 06/17/2016    Allergies  Allergen Reactions  . Tetracyclines & Related Rash    Medications: Patient's  Medications  New Prescriptions   No medications on file  Previous Medications   ALPRAZOLAM (XANAX) 0.25 MG TABLET    Take 1 tablet (0.25 mg total) by mouth 2 (two) times daily as needed for anxiety or sleep (May be used on call to MRI suite.).   ASPIRIN 81 MG CHEWABLE TABLET    Chew 81 mg by mouth daily.     ATORVASTATIN (LIPITOR) 20 MG TABLET    Take 20 mg by mouth every evening.   CLOPIDOGREL (PLAVIX) 75 MG TABLET    Take 75 mg by mouth daily.     CYANOCOBALAMIN (,VITAMIN B-12,) 1000 MCG/ML INJECTION    Inject 1 mL (1,000 mcg total) into the skin daily. Take 1042mg SQ daily x 1 week, then 10075m sq weekly x 1 month, then 100072m SQ monthly.   FUROSEMIDE (LASIX) 40 MG TABLET    Take 1 tablet (40 mg total) by mouth daily.   LOSARTAN (COZAAR) 50 MG TABLET    Take 50 mg by mouth daily.     METOPROLOL TARTRATE (LOPRESSOR) 25 MG TABLET    Take 25 mg by mouth 2 (two) times daily.     NUTRITIONAL SUPPLEMENTS (NUTRITIONAL SUPPLEMENT PO)    Take 120 mLs by mouth 2 (two) times daily.   QUETIAPINE (SEROQUEL) 25 MG TABLET    Take 1 tablet (25 mg total)  by mouth at bedtime.   SIMVASTATIN (ZOCOR) 40 MG TABLET    Take 40 mg by mouth daily.    Modified Medications   No medications on file  Discontinued Medications   FEEDING SUPPLEMENT, ENSURE ENLIVE, (ENSURE ENLIVE) LIQD    Take 237 mLs by mouth 2 (two) times daily between meals.    Review of Systems  Vitals:   06/24/16 0838  BP: (!) 118/57  Pulse: 67  Resp: 18  Temp: 97.2 F (36.2 C)  TempSrc: Oral  SpO2: 96%  Weight: 138 lb (62.6 kg)  Height: '5\' 5"'$  (1.651 m)   Body mass index is 22.96 kg/m.  Physical Exam   Labs reviewed: Admission on 06/14/2016, Discharged on 06/17/2016  Component Date Value Ref Range Status  . WBC 06/14/2016 4.5  4.0 - 10.5 K/uL Final  . RBC 06/14/2016 4.22  3.87 - 5.11 MIL/uL Final  . Hemoglobin 06/14/2016 12.1  12.0 - 15.0 g/dL Final  . HCT 06/14/2016 39.0  36.0 - 46.0 % Final  . MCV 06/14/2016 92.4  78.0 -  100.0 fL Final  . MCH 06/14/2016 28.7  26.0 - 34.0 pg Final  . MCHC 06/14/2016 31.0  30.0 - 36.0 g/dL Final  . RDW 06/14/2016 14.6  11.5 - 15.5 % Final  . Platelets 06/14/2016 206  150 - 400 K/uL Final  . Color, Urine 06/14/2016 YELLOW  YELLOW Final  . APPearance 06/14/2016 TURBID* CLEAR Final  . Specific Gravity, Urine 06/14/2016 1.028  1.005 - 1.030 Final  . pH 06/14/2016 5.5  5.0 - 8.0 Final  . Glucose, UA 06/14/2016 NEGATIVE  NEGATIVE mg/dL Final  . Hgb urine dipstick 06/14/2016 SMALL* NEGATIVE Final  . Bilirubin Urine 06/14/2016 SMALL* NEGATIVE Final  . Ketones, ur 06/14/2016 15* NEGATIVE mg/dL Final  . Protein, ur 06/14/2016 30* NEGATIVE mg/dL Final  . Nitrite 06/14/2016 NEGATIVE  NEGATIVE Final  . Leukocytes, UA 06/14/2016 SMALL* NEGATIVE Final  . Squamous Epithelial / LPF 06/14/2016 0-5* NONE SEEN Final  . WBC, UA 06/14/2016 6-30  0 - 5 WBC/hpf Final  . RBC / HPF 06/14/2016 0-5  0 - 5 RBC/hpf Final  . Bacteria, UA 06/14/2016 MANY* NONE SEEN Final  . Crystals 06/14/2016 CA OXALATE CRYSTALS* NEGATIVE Final  . Sodium 06/14/2016 145  135 - 145 mmol/L Final  . Potassium 06/14/2016 3.0* 3.5 - 5.1 mmol/L Final  . Chloride 06/14/2016 113* 101 - 111 mmol/L Final  . CO2 06/14/2016 26  22 - 32 mmol/L Final  . Glucose, Bld 06/14/2016 105* 65 - 99 mg/dL Final  . BUN 06/14/2016 10  6 - 20 mg/dL Final  . Creatinine, Ser 06/14/2016 0.86  0.44 - 1.00 mg/dL Final  . Calcium 06/14/2016 9.1  8.9 - 10.3 mg/dL Final  . Total Protein 06/14/2016 6.2* 6.5 - 8.1 g/dL Final  . Albumin 06/14/2016 3.4* 3.5 - 5.0 g/dL Final  . AST 06/14/2016 15  15 - 41 U/L Final  . ALT 06/14/2016 11* 14 - 54 U/L Final  . Alkaline Phosphatase 06/14/2016 64  38 - 126 U/L Final  . Total Bilirubin 06/14/2016 0.8  0.3 - 1.2 mg/dL Final  . GFR calc non Af Amer 06/14/2016 >60  >60 mL/min Final  . GFR calc Af Amer 06/14/2016 >60  >60 mL/min Final   Comment: (NOTE) The eGFR has been calculated using the CKD EPI  equation. This calculation has not been validated in all clinical situations. eGFR's persistently <60 mL/min signify possible Chronic Kidney Disease.   Georgiann Hahn  gap 06/14/2016 6  5 - 15 Final  . Specimen Description 06/17/2016 URINE, CATHETERIZED   Final  . Special Requests 06/17/2016 NONE   Final  . Culture 06/17/2016 >=100,000 COLONIES/mL KLEBSIELLA PNEUMONIAE*  Final  . Report Status 06/17/2016 06/17/2016 FINAL   Final  . Organism ID, Bacteria 06/17/2016 KLEBSIELLA PNEUMONIAE*  Final  . Sodium 06/16/2016 140  135 - 145 mmol/L Final  . Potassium 06/16/2016 3.8  3.5 - 5.1 mmol/L Final  . Chloride 06/16/2016 102  101 - 111 mmol/L Final  . CO2 06/16/2016 29  22 - 32 mmol/L Final  . Glucose, Bld 06/16/2016 126* 65 - 99 mg/dL Final  . BUN 06/16/2016 9  6 - 20 mg/dL Final  . Creatinine, Ser 06/16/2016 0.94  0.44 - 1.00 mg/dL Final  . Calcium 06/16/2016 9.1  8.9 - 10.3 mg/dL Final  . GFR calc non Af Amer 06/16/2016 55* >60 mL/min Final  . GFR calc Af Amer 06/16/2016 >60  >60 mL/min Final   Comment: (NOTE) The eGFR has been calculated using the CKD EPI equation. This calculation has not been validated in all clinical situations. eGFR's persistently <60 mL/min signify possible Chronic Kidney Disease.   . Anion gap 06/16/2016 9  5 - 15 Final  . WBC 06/16/2016 5.1  4.0 - 10.5 K/uL Final  . RBC 06/16/2016 4.60  3.87 - 5.11 MIL/uL Final  . Hemoglobin 06/16/2016 13.1  12.0 - 15.0 g/dL Final  . HCT 06/16/2016 41.3  36.0 - 46.0 % Final  . MCV 06/16/2016 89.8  78.0 - 100.0 fL Final  . MCH 06/16/2016 28.5  26.0 - 34.0 pg Final  . MCHC 06/16/2016 31.7  30.0 - 36.0 g/dL Final  . RDW 06/16/2016 14.1  11.5 - 15.5 % Final  . Platelets 06/16/2016 197  150 - 400 K/uL Final  . Folate, Hemolysate 06/17/2016 326.2  Not Estab. ng/mL Final  . Hematocrit 06/17/2016 39.5  34.0 - 46.6 % Final  . Folate, RBC 06/17/2016 826  >498 ng/mL Final   Comment: (NOTE) Performed At: [facility] [place], [place] [id number] Lindon Romp MD [id number]   . Vitamin B-12 06/16/2016 216  180 - 914 pg/mL Final   Comment: (NOTE) This assay is not validated for testing neonatal or myeloproliferative syndrome specimens for Vitamin B12 levels.   . WBC 06/17/2016 6.7  4.0 - 10.5 K/uL Final  . RBC 06/17/2016 4.67  3.87 - 5.11 MIL/uL Final  . Hemoglobin 06/17/2016 13.2  12.0 - 15.0 g/dL Final  . HCT 06/17/2016 41.9  36.0 - 46.0 % Final  . MCV 06/17/2016 89.7  78.0 - 100.0 fL Final  . MCH 06/17/2016 28.3  26.0 - 34.0 pg Final  . MCHC 06/17/2016 31.5  30.0 - 36.0 g/dL Final  . RDW 06/17/2016 14.2  11.5 - 15.5 % Final  . Platelets 06/17/2016 276  150 - 400 K/uL Final  . Sodium 06/17/2016 139  135 - 145 mmol/L Final  . Potassium 06/17/2016 4.2  3.5 - 5.1 mmol/L Final  . Chloride 06/17/2016 102  101 - 111 mmol/L Final  . CO2 06/17/2016 26  22 - 32 mmol/L Final  . Glucose, Bld 06/17/2016 119* 65 - 99 mg/dL Final  . BUN 06/17/2016 24* 6 - 20 mg/dL Final  . Creatinine, Ser 06/17/2016 0.98  0.44 - 1.00 mg/dL Final  . Calcium 06/17/2016 9.2  8.9 - 10.3 mg/dL Final  . GFR calc non Af Amer 06/17/2016 53* >60 mL/min Final  .  GFR calc Af Amer 06/17/2016 >60  >60 mL/min Final   Comment: (NOTE) The eGFR has been calculated using the CKD EPI equation. This calculation has not been validated in all clinical situations. eGFR's persistently <60 mL/min signify possible Chronic Kidney Disease.   . Anion gap 06/17/2016 11  5 - 15 Final  . RPR Ser Ql 06/17/2016 Non Reactive  Non Reactive Final   Comment: (NOTE) Performed At: [facility] [place], [place] [id number] Lindon Romp MD [id number]   . HIV Screen 4th Generation wRfx 06/17/2016 Non Reactive  Non Reactive Final   Comment: (NOTE) Performed At: [facility] [place], [place] [id number] Lindon Romp MD [id number]     Dg Chest 2 View  Result Date: 06/15/2016 CLINICAL DATA:   Stroke.  History of hypertension. EXAM: CHEST  2 VIEW COMPARISON:  09/26/2011 FINDINGS: The heart size appears mildly enlarged. There is aortic atherosclerosis. Small pleural effusions and pulmonary vascular congestion noted. No superimposed airspace consolidation identified. IMPRESSION: 1. Small pleural effusions and pulmonary vascular congestion. 2. Aortic atherosclerosis and mild cardiac enlargement. Electronically Signed   By: Kerby Moors M.D.   On: 06/15/2016 12:14   Ct Head Wo Contrast  Result Date: 06/14/2016 CLINICAL DATA:  Increase confusion EXAM: CT HEAD WITHOUT CONTRAST TECHNIQUE: Contiguous axial images were obtained from the base of the skull through the vertex without intravenous contrast. COMPARISON:  January 03, 2007 FINDINGS: Brain: There is decreased density of left frontal gray and white matter new since prior exam. There is low density involving the superior right frontal lobe new since the prior exam. There is low density involving the bilateral occipital lobes unchanged compared to prior exam, consistent with old infarct. Chronic bilateral periventricular white matter small vessel ischemic changes are noted. There is no midline shift or hydrocephalus. No acute hemorrhage is identified. Vascular: The vessels are unchanged. Skull: There is no acute fracture or dislocation. Sinuses/Orbits: There is a small retention cyst in the left maxillary sinus. The sinuses are otherwise clear. The orbits are normal. IMPRESSION: Decreased density involving the gray-white matter of bilateral frontal lobes new since prior head CT of January 03, 2007. Subacute infarct in the left frontal lobe is not excluded. The finding in the right frontal lobe is probably due to old infarct. Suggest further evaluation with MR brain. 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 These results will be called to the ordering clinician or representative by the Radiologist Assistant, and communication documented in the PACS or  zVision Dashboard. Electronically Signed   By: Abelardo Diesel M.D.   On: 06/14/2016 16:00   Mr Brain Wo Contrast  Result Date: 06/15/2016 CLINICAL DATA:  Stroke.  Increasing confusion EXAM: MRI HEAD WITHOUT CONTRAST MRA HEAD WITHOUT CONTRAST TECHNIQUE: Multiplanar, multiecho pulse sequences of the brain and surrounding structures were obtained without intravenous contrast. Angiographic images of the head were obtained using MRA technique without contrast. COMPARISON:  CT 06/14/2016 FINDINGS: MRI HEAD FINDINGS Image quality degraded by mild motion. Extensive atrophy with diffuse cerebral volume loss and ventricular enlargement. Chronic microvascular ischemic changes in the white matter, thalamus, and pons, and cerebellum bilaterally. Negative for acute infarct. Negative for intracranial hemorrhage. Negative for mass or edema. No shift of the midline structures. Paranasal sinuses clear. No orbital mass lesion. Pituitary not enlarged. MRA HEAD FINDINGS Suboptimal image quality due to motion. Left vertebral artery patent to the basilar. Right vertebral artery does not contribute to the basilar and may be congenitally hypoplastic  distally. Right vertebral artery may end in PICA. Mild irregularity and stenosis proximal basilar. Posterior cerebral arteries patent bilaterally with fetal origin on the right. Moderate stenosis of the left posterior cerebral artery and mild stenosis right posterior cerebral artery. Cavernous carotid patent bilaterally. Anterior and middle cerebral arteries patent bilaterally. Severe stenosis/ occlusion of the lateral branch of the right middle cerebral artery likely supplying the temporal lobe. Negative for aneurysm IMPRESSION: Advanced atrophy.  Chronic microvascular ischemic changes Negative for acute infarct Severe stenosis/ occluded segment of lateral branch of the right middle cerebral artery. Moderate stenosis left posterior cerebral artery and mild stenosis right posterior cerebral  artery. Mild to moderate stenosis proximal basilar artery. Electronically Signed   By: Franchot Gallo M.D.   On: 06/15/2016 11:12   Mr Jodene Nam Head/brain NT Cm  Result Date: 06/15/2016 CLINICAL DATA:  Stroke.  Increasing confusion EXAM: MRI HEAD WITHOUT CONTRAST MRA HEAD WITHOUT CONTRAST TECHNIQUE: Multiplanar, multiecho pulse sequences of the brain and surrounding structures were obtained without intravenous contrast. Angiographic images of the head were obtained using MRA technique without contrast. COMPARISON:  CT 06/14/2016 FINDINGS: MRI HEAD FINDINGS Image quality degraded by mild motion. Extensive atrophy with diffuse cerebral volume loss and ventricular enlargement. Chronic microvascular ischemic changes in the white matter, thalamus, and pons, and cerebellum bilaterally. Negative for acute infarct. Negative for intracranial hemorrhage. Negative for mass or edema. No shift of the midline structures. Paranasal sinuses clear. No orbital mass lesion. Pituitary not enlarged. MRA HEAD FINDINGS Suboptimal image quality due to motion. Left vertebral artery patent to the basilar. Right vertebral artery does not contribute to the basilar and may be congenitally hypoplastic distally. Right vertebral artery may end in PICA. Mild irregularity and stenosis proximal basilar. Posterior cerebral arteries patent bilaterally with fetal origin on the right. Moderate stenosis of the left posterior cerebral artery and mild stenosis right posterior cerebral artery. Cavernous carotid patent bilaterally. Anterior and middle cerebral arteries patent bilaterally. Severe stenosis/ occlusion of the lateral branch of the right middle cerebral artery likely supplying the temporal lobe. Negative for aneurysm IMPRESSION: Advanced atrophy.  Chronic microvascular ischemic changes Negative for acute infarct Severe stenosis/ occluded segment of lateral branch of the right middle cerebral artery. Moderate stenosis left posterior cerebral artery  and mild stenosis right posterior cerebral artery. Mild to moderate stenosis proximal basilar artery. Electronically Signed   By: Franchot Gallo M.D.   On: 06/15/2016 11:12     Assessment/Plan    Monica S. Perlie Gold  [facility] [address] [place], [place] [address] [phone] Cell (Monday-Friday 8 AM - 5 PM) [phone] After 5 PM and follow prompts

## 2016-06-24 NOTE — Progress Notes (Signed)
Patient ID: [name], female   DOB: 05/19/1933, 82 y.o.   MRN: [id number]    HISTORY AND PHYSICAL   DATE:  06/24/2016  Location:     [facility] Room Number: 130 A Place of Service: SNF (31)   Extended Emergency Contact Information Primary Emergency Contact: Law,Roger Address: [place], [place] Phone: [phone] Relation: Spouse  Advanced Directive information Does patient have an advance directive?: No, Would patient like information on creating an advanced directive?: No - patient declined information  Chief Complaint  Patient presents with  . New Admit To SNF    HPI:  82 yo female seen today as a new admission into SNF following hospital stay for metabolic encephalopathy, UTI, hypokalemia, hx CVA, HTN, hyperlipidemia, B12 deficiency, hx CHF, CAD. She presented to the ED with increased confusion, K+ 3, abnormal UA, ECG changes, possible left frontal lobe subacute infarct on CT head but MRI brain neg for acute process. Urine cx (+) > 100K Kleb pneumonaie. She was tx with IV Rocephin--> 4 more days of po ceftin at d/c. B12 level 216; HIV and RPR neg/nonreactive. She had agitation and was given ativan and seroquel. Hgb 13.2; albumin 3.4; K+ 3-->4.2; Cr 0.95 at d/c. She presents to SNF for short term rehab.  She is crying and c/a spouse who is "cheating on me". No other concerns. No nursing issues. No falls. Appetite ok. Sleeps well. She is a poor historian due to dementia. Hx obtained from chart  Hypertension - stable on cozaar 50 mg daily and lopressor 25 mg twice daily   Vit B12 deficiency - currently on B 12 injections.  B12 level  is 216  Dyslipidemia - stable on zocor 40 mg daily  Hx CHF - stable on lasix 40 mg daily  Hx CVA -  Stable on plavix 75 mg daily and asa 81 mg daily   Vascular dementia - she has had a slow decline in her level of adl function over the past 6 months; her weight is  138 pounds. She is presently not on medication  Agitation with psychosis - stable on seroquel 25 mg nightly and xanax 0.25 mg twice daily as needed for anxiety     Past Medical History:  Diagnosis Date  . CHF (congestive heart failure) ([facility])   . Coronary artery disease   . Hypertension   . Stroke (cerebrum) ([facility]) 06/14/2016    History reviewed. No pertinent surgical history.  Patient Care Team: Maury Dus, MD as PCP - General (Family Medicine)  Social History   Social History  . Marital status: Married    Spouse name: N/A  . Number of children: N/A  . Years of education: N/A   Occupational History  . Not on file.   Social History Main Topics  . Smoking status: Never Smoker  . Smokeless tobacco: Never Used  . Alcohol use No  . Drug use: No  . Sexual activity: Yes    Birth control/ protection: Post-menopausal   Other Topics Concern  . Not on file   Social History Narrative  . No narrative on file     reports that she has never smoked. She has never used smokeless tobacco. She reports that she does not drink alcohol or use drugs.  Family History  Problem Relation Age of Onset  . Heart disease Mother    Family Status  Relation Status  .  Mother     Immunization History  Administered Date(s) Administered  . PPD Test 06/17/2016    Allergies  Allergen Reactions  . Tetracyclines & Related Rash    Medications: Patient's Medications  New Prescriptions   No medications on file  Previous Medications   ALPRAZOLAM (XANAX) 0.25 MG TABLET    Take 1 tablet (0.25 mg total) by mouth 2 (two) times daily as needed for anxiety or sleep (May be used on call to MRI suite.).   ASPIRIN 81 MG CHEWABLE TABLET    Chew 81 mg by mouth daily.     ATORVASTATIN (LIPITOR) 20 MG TABLET    Take 20 mg by mouth every evening.   CLOPIDOGREL (PLAVIX) 75 MG TABLET    Take 75 mg by mouth daily.     CYANOCOBALAMIN (,VITAMIN B-12,) 1000 MCG/ML INJECTION    Inject 1 mL (1,000 mcg total)  into the skin daily. Take 1059mg SQ daily x 1 week, then 10068m sq weekly x 1 month, then 100053m SQ monthly.   FUROSEMIDE (LASIX) 40 MG TABLET    Take 1 tablet (40 mg total) by mouth daily.   LOSARTAN (COZAAR) 50 MG TABLET    Take 50 mg by mouth daily.     METOPROLOL TARTRATE (LOPRESSOR) 25 MG TABLET    Take 25 mg by mouth 2 (two) times daily.     NUTRITIONAL SUPPLEMENTS (NUTRITIONAL SUPPLEMENT PO)    Take 120 mLs by mouth 2 (two) times daily.   QUETIAPINE (SEROQUEL) 25 MG TABLET    Take 1 tablet (25 mg total) by mouth at bedtime.  Modified Medications   No medications on file  Discontinued Medications   FEEDING SUPPLEMENT, ENSURE ENLIVE, (ENSURE ENLIVE) LIQD    Take 237 mLs by mouth 2 (two) times daily between meals.   SIMVASTATIN (ZOCOR) 40 MG TABLET    Take 40 mg by mouth daily.      Review of Systems  Unable to perform ROS: Dementia    Vitals:   06/24/16 0838  BP: (!) 118/57  Pulse: 67  Resp: 18  Temp: 97.2 F (36.2 C)  TempSrc: Oral  SpO2: 96%  Weight: 138 lb (62.6 kg)  Height: '5\' 5"'$  (1.651 m)   Body mass index is 22.96 kg/m.  Physical Exam  Constitutional: She appears well-developed.  Frail appearing, sitting in w/c in NAD. Tearful   HENT:  Mouth/Throat: Oropharynx is clear and moist. No oropharyngeal exudate.  Eyes: Pupils are equal, round, and reactive to light. No scleral icterus.  Neck: Neck supple. Carotid bruit is not present. No tracheal deviation present. No thyromegaly present.  Cardiovascular: Regular rhythm and intact distal pulses.  Bradycardia present.  Exam reveals no gallop and no friction rub.   Murmur heard.  Systolic murmur is present with a grade of 1/6  +2 pitting LE edema b/l. No calf TTP. Chronic venous stasis changes b/l LE  Pulmonary/Chest: Effort normal and breath sounds normal. No stridor. No respiratory distress. She has no wheezes. She has no rales.  Abdominal: Soft. Bowel sounds are normal. She exhibits no distension and no mass.  There is no hepatomegaly. There is no tenderness. There is no rebound and no guarding.  Musculoskeletal: She exhibits edema.  Lymphadenopathy:    She has no cervical adenopathy.  Neurological: She is alert.  Skin: Skin is warm and dry. No rash noted.  Psychiatric: Her behavior is normal. Her mood appears anxious. Thought content is delusional.     Labs reviewed: Admission  on 06/14/2016, Discharged on 06/17/2016  Component Date Value Ref Range Status  . WBC 06/14/2016 4.5  4.0 - 10.5 K/uL Final  . RBC 06/14/2016 4.22  3.87 - 5.11 MIL/uL Final  . Hemoglobin 06/14/2016 12.1  12.0 - 15.0 g/dL Final  . HCT 06/14/2016 39.0  36.0 - 46.0 % Final  . MCV 06/14/2016 92.4  78.0 - 100.0 fL Final  . MCH 06/14/2016 28.7  26.0 - 34.0 pg Final  . MCHC 06/14/2016 31.0  30.0 - 36.0 g/dL Final  . RDW 06/14/2016 14.6  11.5 - 15.5 % Final  . Platelets 06/14/2016 206  150 - 400 K/uL Final  . Color, Urine 06/14/2016 YELLOW  YELLOW Final  . APPearance 06/14/2016 TURBID* CLEAR Final  . Specific Gravity, Urine 06/14/2016 1.028  1.005 - 1.030 Final  . pH 06/14/2016 5.5  5.0 - 8.0 Final  . Glucose, UA 06/14/2016 NEGATIVE  NEGATIVE mg/dL Final  . Hgb urine dipstick 06/14/2016 SMALL* NEGATIVE Final  . Bilirubin Urine 06/14/2016 SMALL* NEGATIVE Final  . Ketones, ur 06/14/2016 15* NEGATIVE mg/dL Final  . Protein, ur 06/14/2016 30* NEGATIVE mg/dL Final  . Nitrite 06/14/2016 NEGATIVE  NEGATIVE Final  . Leukocytes, UA 06/14/2016 SMALL* NEGATIVE Final  . Squamous Epithelial / LPF 06/14/2016 0-5* NONE SEEN Final  . WBC, UA 06/14/2016 6-30  0 - 5 WBC/hpf Final  . RBC / HPF 06/14/2016 0-5  0 - 5 RBC/hpf Final  . Bacteria, UA 06/14/2016 MANY* NONE SEEN Final  . Crystals 06/14/2016 CA OXALATE CRYSTALS* NEGATIVE Final  . Sodium 06/14/2016 145  135 - 145 mmol/L Final  . Potassium 06/14/2016 3.0* 3.5 - 5.1 mmol/L Final  . Chloride 06/14/2016 113* 101 - 111 mmol/L Final  . CO2 06/14/2016 26  22 - 32 mmol/L Final  .  Glucose, Bld 06/14/2016 105* 65 - 99 mg/dL Final  . BUN 06/14/2016 10  6 - 20 mg/dL Final  . Creatinine, Ser 06/14/2016 0.86  0.44 - 1.00 mg/dL Final  . Calcium 06/14/2016 9.1  8.9 - 10.3 mg/dL Final  . Total Protein 06/14/2016 6.2* 6.5 - 8.1 g/dL Final  . Albumin 06/14/2016 3.4* 3.5 - 5.0 g/dL Final  . AST 06/14/2016 15  15 - 41 U/L Final  . ALT 06/14/2016 11* 14 - 54 U/L Final  . Alkaline Phosphatase 06/14/2016 64  38 - 126 U/L Final  . Total Bilirubin 06/14/2016 0.8  0.3 - 1.2 mg/dL Final  . GFR calc non Af Amer 06/14/2016 >60  >60 mL/min Final  . GFR calc Af Amer 06/14/2016 >60  >60 mL/min Final   Comment: (NOTE) The eGFR has been calculated using the CKD EPI equation. This calculation has not been validated in all clinical situations. eGFR's persistently <60 mL/min signify possible Chronic Kidney Disease.   . Anion gap 06/14/2016 6  5 - 15 Final  . Specimen Description 06/17/2016 URINE, CATHETERIZED   Final  . Special Requests 06/17/2016 NONE   Final  . Culture 06/17/2016 >=100,000 COLONIES/mL KLEBSIELLA PNEUMONIAE*  Final  . Report Status 06/17/2016 06/17/2016 FINAL   Final  . Organism ID, Bacteria 06/17/2016 KLEBSIELLA PNEUMONIAE*  Final  . Sodium 06/16/2016 140  135 - 145 mmol/L Final  . Potassium 06/16/2016 3.8  3.5 - 5.1 mmol/L Final  . Chloride 06/16/2016 102  101 - 111 mmol/L Final  . CO2 06/16/2016 29  22 - 32 mmol/L Final  . Glucose, Bld 06/16/2016 126* 65 - 99 mg/dL Final  . BUN 06/16/2016 9  6 -  20 mg/dL Final  . Creatinine, Ser 06/16/2016 0.94  0.44 - 1.00 mg/dL Final  . Calcium 06/16/2016 9.1  8.9 - 10.3 mg/dL Final  . GFR calc non Af Amer 06/16/2016 55* >60 mL/min Final  . GFR calc Af Amer 06/16/2016 >60  >60 mL/min Final   Comment: (NOTE) The eGFR has been calculated using the CKD EPI equation. This calculation has not been validated in all clinical situations. eGFR's persistently <60 mL/min signify possible Chronic Kidney Disease.   . Anion gap 06/16/2016  9  5 - 15 Final  . WBC 06/16/2016 5.1  4.0 - 10.5 K/uL Final  . RBC 06/16/2016 4.60  3.87 - 5.11 MIL/uL Final  . Hemoglobin 06/16/2016 13.1  12.0 - 15.0 g/dL Final  . HCT 06/16/2016 41.3  36.0 - 46.0 % Final  . MCV 06/16/2016 89.8  78.0 - 100.0 fL Final  . MCH 06/16/2016 28.5  26.0 - 34.0 pg Final  . MCHC 06/16/2016 31.7  30.0 - 36.0 g/dL Final  . RDW 06/16/2016 14.1  11.5 - 15.5 % Final  . Platelets 06/16/2016 197  150 - 400 K/uL Final  . Folate, Hemolysate 06/17/2016 326.2  Not Estab. ng/mL Final  . Hematocrit 06/17/2016 39.5  34.0 - 46.6 % Final  . Folate, RBC 06/17/2016 826  >498 ng/mL Final   Comment: (NOTE) Performed At: [facility] [place], [place] [id number] Lindon Romp MD [id number]   . Vitamin B-12 06/16/2016 216  180 - 914 pg/mL Final   Comment: (NOTE) This assay is not validated for testing neonatal or myeloproliferative syndrome specimens for Vitamin B12 levels.   . WBC 06/17/2016 6.7  4.0 - 10.5 K/uL Final  . RBC 06/17/2016 4.67  3.87 - 5.11 MIL/uL Final  . Hemoglobin 06/17/2016 13.2  12.0 - 15.0 g/dL Final  . HCT 06/17/2016 41.9  36.0 - 46.0 % Final  . MCV 06/17/2016 89.7  78.0 - 100.0 fL Final  . MCH 06/17/2016 28.3  26.0 - 34.0 pg Final  . MCHC 06/17/2016 31.5  30.0 - 36.0 g/dL Final  . RDW 06/17/2016 14.2  11.5 - 15.5 % Final  . Platelets 06/17/2016 276  150 - 400 K/uL Final  . Sodium 06/17/2016 139  135 - 145 mmol/L Final  . Potassium 06/17/2016 4.2  3.5 - 5.1 mmol/L Final  . Chloride 06/17/2016 102  101 - 111 mmol/L Final  . CO2 06/17/2016 26  22 - 32 mmol/L Final  . Glucose, Bld 06/17/2016 119* 65 - 99 mg/dL Final  . BUN 06/17/2016 24* 6 - 20 mg/dL Final  . Creatinine, Ser 06/17/2016 0.98  0.44 - 1.00 mg/dL Final  . Calcium 06/17/2016 9.2  8.9 - 10.3 mg/dL Final  . GFR calc non Af Amer 06/17/2016 53* >60 mL/min Final  . GFR calc Af Amer 06/17/2016 >60  >60 mL/min Final   Comment: (NOTE) The eGFR has been calculated using  the CKD EPI equation. This calculation has not been validated in all clinical situations. eGFR's persistently <60 mL/min signify possible Chronic Kidney Disease.   . Anion gap 06/17/2016 11  5 - 15 Final  . RPR Ser Ql 06/17/2016 Non Reactive  Non Reactive Final   Comment: (NOTE) Performed At: [facility] [place], [place] [id number] Lindon Romp MD [id number]   . HIV Screen 4th Generation wRfx 06/17/2016 Non Reactive  Non Reactive Final   Comment: (NOTE) Performed At: [facility] [address] [place], [place] [id number]  Lindon Romp MD [id number]     Dg Chest 2 View  Result Date: 06/15/2016 CLINICAL DATA:  Stroke.  History of hypertension. EXAM: CHEST  2 VIEW COMPARISON:  09/26/2011 FINDINGS: The heart size appears mildly enlarged. There is aortic atherosclerosis. Small pleural effusions and pulmonary vascular congestion noted. No superimposed airspace consolidation identified. IMPRESSION: 1. Small pleural effusions and pulmonary vascular congestion. 2. Aortic atherosclerosis and mild cardiac enlargement. Electronically Signed   By: Kerby Moors M.D.   On: 06/15/2016 12:14   Ct Head Wo Contrast  Result Date: 06/14/2016 CLINICAL DATA:  Increase confusion EXAM: CT HEAD WITHOUT CONTRAST TECHNIQUE: Contiguous axial images were obtained from the base of the skull through the vertex without intravenous contrast. COMPARISON:  January 03, 2007 FINDINGS: Brain: There is decreased density of left frontal gray and white matter new since prior exam. There is low density involving the superior right frontal lobe new since the prior exam. There is low density involving the bilateral occipital lobes unchanged compared to prior exam, consistent with old infarct. Chronic bilateral periventricular white matter small vessel ischemic changes are noted. There is no midline shift or hydrocephalus. No acute hemorrhage is identified. Vascular: The vessels are  unchanged. Skull: There is no acute fracture or dislocation. Sinuses/Orbits: There is a small retention cyst in the left maxillary sinus. The sinuses are otherwise clear. The orbits are normal. IMPRESSION: Decreased density involving the gray-white matter of bilateral frontal lobes new since prior head CT of January 03, 2007. Subacute infarct in the left frontal lobe is not excluded. The finding in the right frontal lobe is probably due to old infarct. Suggest further evaluation with MR brain. 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 These results will be called to the ordering clinician or representative by the Radiologist Assistant, and communication documented in the PACS or zVision Dashboard. Electronically Signed   By: Abelardo Diesel M.D.   On: 06/14/2016 16:00   Mr Brain Wo Contrast  Result Date: 06/15/2016 CLINICAL DATA:  Stroke.  Increasing confusion EXAM: MRI HEAD WITHOUT CONTRAST MRA HEAD WITHOUT CONTRAST TECHNIQUE: Multiplanar, multiecho pulse sequences of the brain and surrounding structures were obtained without intravenous contrast. Angiographic images of the head were obtained using MRA technique without contrast. COMPARISON:  CT 06/14/2016 FINDINGS: MRI HEAD FINDINGS Image quality degraded by mild motion. Extensive atrophy with diffuse cerebral volume loss and ventricular enlargement. Chronic microvascular ischemic changes in the white matter, thalamus, and pons, and cerebellum bilaterally. Negative for acute infarct. Negative for intracranial hemorrhage. Negative for mass or edema. No shift of the midline structures. Paranasal sinuses clear. No orbital mass lesion. Pituitary not enlarged. MRA HEAD FINDINGS Suboptimal image quality due to motion. Left vertebral artery patent to the basilar. Right vertebral artery does not contribute to the basilar and may be congenitally hypoplastic distally. Right vertebral artery may end in PICA. Mild irregularity and stenosis proximal basilar. Posterior  cerebral arteries patent bilaterally with fetal origin on the right. Moderate stenosis of the left posterior cerebral artery and mild stenosis right posterior cerebral artery. Cavernous carotid patent bilaterally. Anterior and middle cerebral arteries patent bilaterally. Severe stenosis/ occlusion of the lateral branch of the right middle cerebral artery likely supplying the temporal lobe. Negative for aneurysm IMPRESSION: Advanced atrophy.  Chronic microvascular ischemic changes Negative for acute infarct Severe stenosis/ occluded segment of lateral branch of the right middle cerebral artery. Moderate stenosis left posterior cerebral artery and mild stenosis right posterior cerebral artery. Mild to moderate stenosis proximal basilar artery.  Electronically Signed   By: Franchot Gallo M.D.   On: 06/15/2016 11:12   Mr Jodene Nam Head/brain OP Cm  Result Date: 06/15/2016 CLINICAL DATA:  Stroke.  Increasing confusion EXAM: MRI HEAD WITHOUT CONTRAST MRA HEAD WITHOUT CONTRAST TECHNIQUE: Multiplanar, multiecho pulse sequences of the brain and surrounding structures were obtained without intravenous contrast. Angiographic images of the head were obtained using MRA technique without contrast. COMPARISON:  CT 06/14/2016 FINDINGS: MRI HEAD FINDINGS Image quality degraded by mild motion. Extensive atrophy with diffuse cerebral volume loss and ventricular enlargement. Chronic microvascular ischemic changes in the white matter, thalamus, and pons, and cerebellum bilaterally. Negative for acute infarct. Negative for intracranial hemorrhage. Negative for mass or edema. No shift of the midline structures. Paranasal sinuses clear. No orbital mass lesion. Pituitary not enlarged. MRA HEAD FINDINGS Suboptimal image quality due to motion. Left vertebral artery patent to the basilar. Right vertebral artery does not contribute to the basilar and may be congenitally hypoplastic distally. Right vertebral artery may end in PICA. Mild  irregularity and stenosis proximal basilar. Posterior cerebral arteries patent bilaterally with fetal origin on the right. Moderate stenosis of the left posterior cerebral artery and mild stenosis right posterior cerebral artery. Cavernous carotid patent bilaterally. Anterior and middle cerebral arteries patent bilaterally. Severe stenosis/ occlusion of the lateral branch of the right middle cerebral artery likely supplying the temporal lobe. Negative for aneurysm IMPRESSION: Advanced atrophy.  Chronic microvascular ischemic changes Negative for acute infarct Severe stenosis/ occluded segment of lateral branch of the right middle cerebral artery. Moderate stenosis left posterior cerebral artery and mild stenosis right posterior cerebral artery. Mild to moderate stenosis proximal basilar artery. Electronically Signed   By: Franchot Gallo M.D.   On: 06/15/2016 11:12     Assessment/Plan   ICD-9-CM ICD-10-CM   1. B12 deficiency 266.2 E53.8   2. Vascular dementia with behavior disturbance 290.40 F01.51   3. Acute cystitis without hematuria 595.0 N30.00   4. Cerebrovascular disease 437.9 I67.9   5. Essential hypertension 401.9 I10   6. Chronic congestive heart failure, unspecified congestive heart failure type (HCC) 428.0 I50.9   7. Mixed hyperlipidemia 272.2 E78.2     Check CMP  D/c daily B12 injection. May continue weekly x 4 then monthly  Complete ceftin tx for uti  Cont other meds as ordered  PT/OT/ST as ordered  Nutritional supplements as ordered  GOAL: short term rehab and d/c home when medically appropriate. Communicated with pt and nursing.  Will follow  Clarice Zulauf S. Perlie Gold  [facility] [address] [place], [place] [address] [phone] Cell (Monday-Friday 8 AM - 5 PM) [phone] After 5 PM and follow prompts

## 2016-06-27 NOTE — Progress Notes (Addendum)
Late entry for missed g-code    06/15/16 1415  OT Time Calculation  OT Start Time (ACUTE ONLY) 1412  OT Stop Time (ACUTE ONLY) 1438  OT Time Calculation (min) 26 min  OT G-codes **NOT FOR INPATIENT CLASS**  Functional Assessment Tool Used Clinical judgement  Functional Limitation Self care  Self Care Current Status (Z6109(G8987) CJ  Self Care Goal Status (U0454(G8988) CJ  Self Care Discharge Status (U9811(G8989) CJ  OT General Charges  $OT Visit 1 Procedure  OT Evaluation  $OT Eval Moderate Complexity 1 Procedure   [name] M.S., OTR/L Pager: [phone]

## 2016-06-27 NOTE — Progress Notes (Signed)
SLP Note (late entry):    06/16/16 1600  SLP G-Codes **NOT FOR INPATIENT CLASS**  Functional Assessment Tool Used skilled clinical judgment  Functional Limitations Memory  Memory Current Status (G9562(G9168) CL  Memory Goal Status (Z3086(G9169) CK  SLP Evaluations  $ SLP Speech Visit 1 Procedure  SLP Evaluations  $Speech Treatment for Individual 1 Procedure  $ SLP EVAL LANGUAGE/SOUND PRODUCTION 1 Procedure   Maxcine HamLaura Paiewonsky, M.A. CCC-SLP [phone]

## 2016-10-22 NOTE — Progress Notes (Signed)
PT evaluation addendum Late entry for 06/15/16 Visit diagnosis    06/15/16 1415  PT Assessment  PT Recommendation/Assessment Patient needs continued PT services  PT Problem List Decreased strength;Decreased activity tolerance;Decreased balance;Decreased mobility;Decreased coordination;Decreased cognition;Decreased safety awareness  Barriers to Discharge Decreased caregiver support  PT Therapy Diagnosis  Difficulty walking;Abnormality of gait;Generalized weakness;Altered mental status (Visit diagnosis:  muscle weakness, generalized)  10/22/2016 Corlis HoveMargie Avagrace Botelho, PT [phone]

## 2016-11-03 NOTE — Progress Notes (Signed)
OT Note - Addenedum    06/15/16 1600  OT Assessment  OT Problem List Other (comment) (muscle weakness, generalized)  [facility] Vernal Rutan, OT/L  [phone] 11/03/2016

## 2016-11-14 ENCOUNTER — Emergency Department (HOSPITAL_COMMUNITY): Payer: Medicare Other

## 2016-11-14 ENCOUNTER — Encounter (HOSPITAL_COMMUNITY): Payer: Self-pay | Admitting: Nurse Practitioner

## 2016-11-14 ENCOUNTER — Emergency Department (HOSPITAL_COMMUNITY)
Admission: EM | Admit: 2016-11-14 | Discharge: 2016-11-15 | Disposition: A | Discharge status: Other Acute Inpatient Hospital | Payer: Medicare Other | Attending: Emergency Medicine | Admitting: Emergency Medicine

## 2016-11-14 DIAGNOSIS — Z79899 Other long term (current) drug therapy: Secondary | ICD-10-CM | POA: Insufficient documentation

## 2016-11-14 DIAGNOSIS — Z7982 Long term (current) use of aspirin: Secondary | ICD-10-CM | POA: Insufficient documentation

## 2016-11-14 DIAGNOSIS — F0391 Unspecified dementia with behavioral disturbance: Secondary | ICD-10-CM | POA: Diagnosis present

## 2016-11-14 DIAGNOSIS — Z8673 Personal history of transient ischemic attack (TIA), and cerebral infarction without residual deficits: Secondary | ICD-10-CM | POA: Insufficient documentation

## 2016-11-14 DIAGNOSIS — Z8249 Family history of ischemic heart disease and other diseases of the circulatory system: Secondary | ICD-10-CM | POA: Diagnosis not present

## 2016-11-14 DIAGNOSIS — I110 Hypertensive heart disease with heart failure: Secondary | ICD-10-CM | POA: Insufficient documentation

## 2016-11-14 DIAGNOSIS — F329 Major depressive disorder, single episode, unspecified: Secondary | ICD-10-CM | POA: Diagnosis not present

## 2016-11-14 DIAGNOSIS — Z888 Allergy status to other drugs, medicaments and biological substances status: Secondary | ICD-10-CM | POA: Diagnosis not present

## 2016-11-14 DIAGNOSIS — F03918 Unspecified dementia, unspecified severity, with other behavioral disturbance: Secondary | ICD-10-CM | POA: Diagnosis present

## 2016-11-14 DIAGNOSIS — R0602 Shortness of breath: Secondary | ICD-10-CM | POA: Diagnosis not present

## 2016-11-14 DIAGNOSIS — R443 Hallucinations, unspecified: Secondary | ICD-10-CM | POA: Diagnosis present

## 2016-11-14 DIAGNOSIS — I509 Heart failure, unspecified: Secondary | ICD-10-CM | POA: Diagnosis not present

## 2016-11-14 DIAGNOSIS — I2510 Atherosclerotic heart disease of native coronary artery without angina pectoris: Secondary | ICD-10-CM | POA: Insufficient documentation

## 2016-11-14 LAB — ETHANOL

## 2016-11-14 LAB — COMPREHENSIVE METABOLIC PANEL
ALT: 14 U/L (ref 14–54)
AST: 21 U/L (ref 15–41)
Albumin: 3.8 g/dL (ref 3.5–5.0)
Alkaline Phosphatase: 74 U/L (ref 38–126)
Anion gap: 7 (ref 5–15)
BUN: 16 mg/dL (ref 6–20)
CHLORIDE: 109 mmol/L (ref 101–111)
CO2: 26 mmol/L (ref 22–32)
Calcium: 9.1 mg/dL (ref 8.9–10.3)
Creatinine, Ser: 0.88 mg/dL (ref 0.44–1.00)
GFR calc Af Amer: >60 mL/min (ref >60)
GFR, EST NON AFRICAN AMERICAN: 60 mL/min — AB (ref >60)
Glucose, Bld: 138 mg/dL — ABNORMAL HIGH (ref 65–99)
Potassium: 4.1 mmol/L (ref 3.5–5.1)
Sodium: 142 mmol/L (ref 135–145)
Total Bilirubin: 0.4 mg/dL (ref 0.3–1.2)
Total Protein: 6.9 g/dL (ref 6.5–8.1)

## 2016-11-14 LAB — CBC
HCT: 33.9 % — ABNORMAL LOW (ref 36.0–46.0)
HEMOGLOBIN: 10.8 g/dL — AB (ref 12.0–15.0)
MCH: 28 pg (ref 26.0–34.0)
MCHC: 31.9 g/dL (ref 30.0–36.0)
MCV: 87.8 fL (ref 78.0–100.0)
PLATELETS: 230 10*3/uL (ref 150–400)
RBC: 3.86 MIL/uL — AB (ref 3.87–5.11)
RDW: 13.3 % (ref 11.5–15.5)
WBC: 4.6 10*3/uL (ref 4.0–10.5)

## 2016-11-14 LAB — URINALYSIS, ROUTINE W REFLEX MICROSCOPIC
BILIRUBIN URINE: NEGATIVE
Glucose, UA: NEGATIVE mg/dL
Hgb urine dipstick: NEGATIVE
KETONES UR: NEGATIVE mg/dL
Leukocytes, UA: NEGATIVE
NITRITE: NEGATIVE
Protein, ur: NEGATIVE mg/dL
Specific Gravity, Urine: 1.013 (ref 1.005–1.030)
pH: 6 (ref 5.0–8.0)

## 2016-11-14 LAB — ACETAMINOPHEN LEVEL: Acetaminophen (Tylenol), Serum: <10 ug/mL — ABNORMAL LOW (ref 10–30)

## 2016-11-14 LAB — RAPID URINE DRUG SCREEN, HOSP PERFORMED
AMPHETAMINES: NOT DETECTED
BARBITURATES: NOT DETECTED
Benzodiazepines: NOT DETECTED
Cocaine: NOT DETECTED
Opiates: NOT DETECTED
TETRAHYDROCANNABINOL: NOT DETECTED

## 2016-11-14 LAB — CBG MONITORING, ED: GLUCOSE-CAPILLARY: 170 mg/dL — AB (ref 65–99)

## 2016-11-14 LAB — SALICYLATE LEVEL: Salicylate Lvl: <7 mg/dL (ref 2.8–30.0)

## 2016-11-14 MED ORDER — ATORVASTATIN CALCIUM 20 MG PO TABS
20.0000 mg | ORAL_TABLET | Freq: Every evening | ORAL | Status: DC
  Administered 2016-11-14: 20 mg via ORAL
  Filled 2016-11-14 (×2): qty 1

## 2016-11-14 MED ORDER — CLOPIDOGREL BISULFATE 75 MG PO TABS
75.0000 mg | ORAL_TABLET | Freq: Every day | ORAL | Status: DC
  Administered 2016-11-14 – 2016-11-15 (×2): 75 mg via ORAL
  Filled 2016-11-14 (×2): qty 1

## 2016-11-14 MED ORDER — ONDANSETRON HCL 4 MG PO TABS: 4.0000 mg | ORAL_TABLET | Freq: Three times a day (TID) | ORAL | Status: DC | PRN

## 2016-11-14 MED ORDER — LOSARTAN POTASSIUM 50 MG PO TABS
50.0000 mg | ORAL_TABLET | Freq: Every day | ORAL | Status: DC
  Administered 2016-11-14 – 2016-11-15 (×2): 50 mg via ORAL
  Filled 2016-11-14 (×2): qty 1

## 2016-11-14 MED ORDER — ACETAMINOPHEN 325 MG PO TABS: 650.0000 mg | ORAL_TABLET | ORAL | Status: DC | PRN

## 2016-11-14 MED ORDER — ASPIRIN 81 MG PO CHEW
81.0000 mg | CHEWABLE_TABLET | Freq: Every day | ORAL | Status: DC
  Administered 2016-11-14 – 2016-11-15 (×2): 81 mg via ORAL
  Filled 2016-11-14 (×2): qty 1

## 2016-11-14 MED ORDER — METOPROLOL TARTRATE 25 MG PO TABS
25.0000 mg | ORAL_TABLET | Freq: Two times a day (BID) | ORAL | Status: DC
  Administered 2016-11-14 – 2016-11-15 (×2): 25 mg via ORAL
  Filled 2016-11-14 (×2): qty 1

## 2016-11-14 NOTE — ED Notes (Signed)
Patient ambulated to restroom with the assistance of a walker. Urine hat set in commode and patient missed hat.
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
## 2016-11-14 NOTE — BH Assessment (Signed)
Under Review: [place]rynn Marr, [address] [place], [address], [place], [place], [facility] [place], [place], [place], [place]

## 2016-11-14 NOTE — ED Notes (Signed)
Patient has: Multicolored robe/African dress Red sweatshirt White striped button shirt(urine stained) Black flat shoes

## 2016-11-14 NOTE — ED Triage Notes (Signed)
Pt is presented from home by [employer] and medics. She has been IVC'd by her spouse, petition states that she has "been hallucinating, destroying property at home, and stating that she is hearing voices telling to do all these." The petitoner further states "respondent is not taking her antidepressants and has been previously committed for being a danger to herself." She denies hallucination, denies suicidal and homicidal ideation, she is AOx4 at this time, further offers hx stating that "her husband has asked her for a divorce, and she knows that he is preparing to be with another woman."

## 2016-11-14 NOTE — ED Provider Notes (Signed)
[facility] Provider Note   CSN: [id number] Arrival date & time: 11/14/16  1558     History   Chief Complaint Chief Complaint  Patient presents with  . Hallucinations  . IVC'd    HPI [name] is a 83 y.o. female.  83 year old female presents after being placed under IVC by her husband due to bizarre behavior. Does have a history of dementia. Patient denies any homicidal or suicidal ideations. Denies any somatic complaints of fever, cough, congestion, urinary tract infection symptoms. States that her husband placed her under IVC because he wants to be 'with a white woman'. According to the dictation, patient has been hallucinating and destroying property at home. She has been responding to internal stimuli. Patient denies any auditory or visual hallucinations. Denies any current alcohol or illicit drug use. EMS and police called and patient transported here.      Past Medical History:  Diagnosis Date  . CHF (congestive heart failure) (HCC)   . Coronary artery disease   . Hypertension   . 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 Benign hypertensive heart disease without heart failure 06/19/2016  . Chronic CHF (congestive heart failure) (HCC) 06/19/2016  . B12 deficiency 06/17/2016  . Metabolic encephalopathy 06/15/2016  . 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 Hyperlipidemia 06/14/2016  . Hypokalemia 06/14/2016  . UTI (urinary tract infection) 06/14/2016  . Vascular dementia with behavior disturbance 06/14/2016    History reviewed. No pertinent surgical history.  OB History    No data available       Home Medications    Prior to Admission medications   Medication Sig Start Date End Date Taking? Authorizing Provider  ALPRAZolam Prudy Feeler) 0.25 MG tablet Take 1 tablet (0.25 mg total) by mouth 2 (two) times daily as needed for anxiety or sleep (May be used on call to MRI suite.). 06/17/16   Rodolph Bong, MD  aspirin 81 MG chewable tablet Chew 81 mg by mouth daily.      Historical Provider, MD  atorvastatin (LIPITOR) 20 MG tablet Take 20 mg by mouth every evening.    Historical Provider, MD  clopidogrel (PLAVIX) 75 MG tablet Take 75 mg by mouth daily.      Historical Provider, MD  cyanocobalamin (,VITAMIN B-12,) 1000 MCG/ML injection Inject 1 mL (1,000 mcg total) into the skin daily. Take SQ daily x 1 week, then sq weekly x 1 month, then  SQ monthly. 06/17/16   Rodolph Bong, MD  furosemide (LASIX) 40 MG tablet Take 1 tablet (40 mg total) by mouth daily. 06/17/16   Rodolph Bong, MD  losartan (COZAAR) 50 MG tablet Take 50 mg by mouth daily.      Historical Provider, MD  metoprolol tartrate (LOPRESSOR) 25 MG tablet Take 25 mg by mouth 2 (two) times daily.      Historical Provider, MD  Nutritional Supplements (NUTRITIONAL SUPPLEMENT PO) Take 120 mLs by mouth 2 (two) times daily.    Historical Provider, MD  QUEtiapine (SEROQUEL) 25 MG tablet Take 1 tablet (25 mg total) by mouth at bedtime. 06/17/16   Rodolph Bong, MD    Family History Family History  Problem Relation Age of Onset  . Heart disease Mother     Social History Social History  Substance Use Topics  . Smoking status: Never Smoker  . Smokeless tobacco: Never Used  . Alcohol use No  Allergies   Tetracyclines & related   Review of Systems Review of Systems  All other systems reviewed and are negative.    Physical Exam Updated Vital Signs BP 174/90   Pulse 107   Temp 98.2 F (36.8 C) (Oral)   Resp 20   SpO2 94%   Physical Exam  Constitutional: She is oriented to person, place, and time. She appears well-developed and well-nourished.  Non-toxic appearance. No distress.  HENT:  Head: Normocephalic and atraumatic.  Eyes: Conjunctivae, EOM and lids are normal. Pupils are equal, round, and reactive to light.  Neck: Normal range of motion. Neck supple. No tracheal  deviation present. No thyroid mass present.  Cardiovascular: Normal rate, regular rhythm and normal heart sounds.  Exam reveals no gallop.   No murmur heard. Pulmonary/Chest: Effort normal and breath sounds normal. No stridor. No respiratory distress. She has no decreased breath sounds. She has no wheezes. She has no rhonchi. She has no rales.  Abdominal: Soft. Normal appearance and bowel sounds are normal. She exhibits no distension. There is no tenderness. There is no rebound and no CVA tenderness.  Musculoskeletal: Normal range of motion. She exhibits no edema or tenderness.  Neurological: She is alert and oriented to person, place, and time. She has normal strength. No cranial nerve deficit or sensory deficit. GCS eye subscore is 4. GCS verbal subscore is 5. GCS motor subscore is 6.  Skin: Skin is warm and dry. No abrasion and no rash noted.  Psychiatric: Her affect is labile. Her speech is delayed. She is slowed. She is not actively hallucinating.  Nursing note and vitals reviewed.    ED Treatments / Results  Labs (all labs ordered are listed, but only abnormal results are displayed) Labs Reviewed  CBG MONITORING, ED - Abnormal; Notable for the following:       Result Value   Glucose-Capillary 170 (*)    All other components within normal limits  COMPREHENSIVE METABOLIC PANEL  ETHANOL  SALICYLATE LEVEL  ACETAMINOPHEN LEVEL  CBC  RAPID URINE DRUG SCREEN, HOSP PERFORMED  URINALYSIS, ROUTINE W REFLEX MICROSCOPIC    EKG  EKG Interpretation None       Radiology No results found.  Procedures Procedures (including critical care time)  Medications Ordered in ED Medications - No data to display   Initial Impression / Assessment and Plan / ED Course  I have reviewed the triage vital signs and the nursing notes.  Pertinent labs & imaging results that were available during my care of the patient were reviewed by me and considered in my medical decision making (see chart for  details).     Patiently will be medically cleared and then disposition by psychiatry  Final Clinical Impressions(s) / ED Diagnoses   Final diagnoses:  None    New Prescriptions New Prescriptions   No medications on file     Lorre NickAnthony Keyara Ent, MD 11/14/16 1708

## 2016-11-15 DIAGNOSIS — F0391 Unspecified dementia with behavioral disturbance: Secondary | ICD-10-CM

## 2016-11-15 DIAGNOSIS — Z888 Allergy status to other drugs, medicaments and biological substances status: Secondary | ICD-10-CM

## 2016-11-15 DIAGNOSIS — Z79899 Other long term (current) drug therapy: Secondary | ICD-10-CM

## 2016-11-15 DIAGNOSIS — Z8249 Family history of ischemic heart disease and other diseases of the circulatory system: Secondary | ICD-10-CM

## 2016-11-15 MED ORDER — RISPERIDONE 0.5 MG PO TBDP
0.5000 mg | ORAL_TABLET | Freq: Every day | ORAL | Status: DC
  Administered 2016-11-15: 0.5 mg via ORAL
  Filled 2016-11-15: qty 1

## 2016-11-15 NOTE — BH Assessment (Signed)
Contacted patients husband Fredrik CoveRoger [phone] at the psychiatrists request.  Patients husband states that she "sees images of people of people that did not exist, she saw people coming in and out of lock doors and windows in our home." He states that this occurs mostly "after 4PM." Patients husband states that the patient has been having an affair with someone and "that's a part of her imagination." Patients husband states that he does not know why the patient states that.  Patients husband states that the patient is on medications but has not had a [facility]. Patients husband states that the pharmacy calls monthly to tell her that the prescriptions is ready but she has not been taking them. Patients son provided the names of the medications as: Bupropion HCL XL 300mg  which was filled in March 2017 expiration date 12/28/2016 he states that the the bottle is "about half full."  Pravastatin 40mg  filled 04/2015 and expires 02/20/2016 he states that the bottle is nearly full.  Pravastatin Sodium 40mg  filled 08/2015 and cannot read the expiration date but it appears that patient has been taking this medications.     Patients husband states that he would feel more comfortable if she stayed in the hospital because he wants her to be on her medication because he wants to be sure that she responds well to the medication and that she is taking it as prescribed. He states that once she is stabilized that he would feel comfortable with her coming home.      Informed Nanine MeansJamison Lord, DNP who recommends inpatient treatment in geropsychiatry.   Davina PokeJoVea Joslynn Jamroz, LCSW Therapeutic Triage Specialist [facility] 11/15/2016 11:32 AM

## 2016-11-15 NOTE — ED Notes (Signed)
EMTALA not signed by pt as she is IVC

## 2016-11-15 NOTE — Progress Notes (Addendum)
Patient has been IVC'd. [name] at [facility] inquired if IVC papers can be refaxed to him at fax#: [phone]. [facility] RN [name] was notified and to [facility].   Writer spoke with patient's husband who inquired if patient could go to the hospital in [place]. Writer informed that we can not hold patient in the ED to look for a second placement option, that patient was recommended inpatient treatment to stabilize her crisis and that she will be going with the [facility] bed offered [facility]. Pt's husband's reported that [place] [facility] and that he is blind and it would be difficult for him to visit. Writer informed pt's husband that [facility] [place] [facility] from [place] and that unfortunately, pt being IVC'd and needing inpatient treatment due to crisis, we need to go with the [facility] bed opening which [place]. Writer added that it may take days or weeks until another hospital will offer a new bed.  Pt's husband reported understanding and requested to speak with charge nurse.  Writer informed pt's husband that he would need to ask the [facility] RN to connect with charge RN. [facility] RN was notified of the husband's request.  [name], LCSWA Disposition staff 11/15/2016 4:46 PM

## 2016-11-15 NOTE — ED Notes (Signed)
[facility] and they will be here to pick her up in 45minutes

## 2016-11-15 NOTE — ED Notes (Signed)
Pt was helped to the bathroom to void by NT.  Husband remains at the bedside at this time to void.  Pt is worried

## 2016-11-15 NOTE — ED Notes (Signed)
[place] [employer] [facility] here for transport to [facility].

## 2016-11-15 NOTE — Progress Notes (Signed)
Patient has been accepted at [facility], to Dr. Guss Bundehalla, bed is ready. Please call report at [phone] when transportation is ready. [facility] RN Marlene BastQuita was notified.  Tinnie GensJeffrey at [facility] inquired if patient is receiving any medication for Dementia. Per [facility] RN Marlene BastQuita, pt is not taking any meds for dementia. [place]rey [facility].  [name], LCSWA Disposition staff 11/15/2016 2:16 PM

## 2016-11-15 NOTE — ED Notes (Signed)
Called [facility] [place] and verified that they received the IVC paperwork that they requested be faxed again.  They confirm that they did and that they are ready for the patient and she can be sent.  Voicemail was left for sheriff for transport and pt and family were notified of this plan.  Await call back from [facility].  Please call the following number to give report to [facility] once transport is set up [phone].  Accepting MD is Dr. Guss Bundehalla

## 2016-11-15 NOTE — Consult Note (Signed)
[facility] Consult   Reason for Consult:  Psychosis in the evening Referring Physician:  EDP Patient Identification: BAO COREAS MRN:  [id number] Principal Diagnosis: Unspecified dementia with behavioral disturbance Diagnosis:   Patient Active Problem List   Diagnosis Date Noted  . Unspecified dementia with behavioral disturbance [F03.91] 06/14/2016    Priority: High  . Benign hypertensive heart disease without heart failure [I11.9] 06/19/2016  . Chronic CHF (congestive heart failure) ([place]) [I50.9] 06/19/2016  . B12 deficiency [E53.8] 06/17/2016  . Metabolic encephalopathy [G64.40] 06/15/2016  . Stroke (cerebrum) ([place]) [I63.9] 06/14/2016  . Hyperlipidemia [E78.5] 06/14/2016  . Hypokalemia [E87.6] 06/14/2016  . UTI (urinary tract infection) [N39.0] 06/14/2016    Total Time spent with patient: 45 minutes  Subjective:   [name] is a 83 y.o. female patient admitted with psychosis related to sundowning.  HPI:  83 yo female was IVC'd by her husband for hallucinations that start about 4 pm daily.  Pleasant and cooperative on assessment with delusions that her husband is having an affair and her son climbs in and out of their window at times.  Medication started and will be observed for 24 hours.  Past Psychiatric History: dementia  Risk to Self: Suicidal Ideation: No Suicidal Intent: No Is patient at risk for suicide?: No Suicidal Plan?: No Access to Means: No What has been your use of drugs/alcohol within the last 12 months?: denies Triggers for Past Attempts: None known Intentional Self Injurious Behavior: None Risk to Others: Homicidal Ideation: No Thoughts of Harm to Others: No Current Homicidal Intent: No Current Homicidal Plan: No Access to Homicidal Means: No History of harm to others?: No Assessment of Violence: None Noted Does patient have access to weapons?: No Criminal Charges Pending?: No Does patient have a court date: No Prior  Inpatient Therapy: Prior Inpatient Therapy: No Prior Outpatient Therapy: Prior Outpatient Therapy: Yes Prior Therapy Dates: 5 or 6 years ago Prior Therapy Facilty/Provider(s): unable to recall Reason for Treatment: unable to recall Does patient have an ACCT team?: No Does patient have Intensive In-House Services?  : No Does patient have [facility] services? : No Does patient have [facility] services?: No  Past Medical History:  Past Medical History:  Diagnosis Date  . CHF (congestive heart failure) ([place])   . Coronary artery disease   . Hypertension   . Stroke (cerebrum) ([place]) 06/14/2016   History reviewed. No pertinent surgical history. Family History:  Family History  Problem Relation Age of Onset  . Heart disease Mother    Family Psychiatric  History: none Social History:  History  Alcohol Use No     History  Drug Use No    Social History   Social History  . Marital status: Married    Spouse name: N/A  . Number of children: N/A  . Years of education: N/A   Social History Main Topics  . Smoking status: Never Smoker  . Smokeless tobacco: Never Used  . Alcohol use No  . Drug use: No  . Sexual activity: Yes    Birth control/ protection: Post-menopausal   Other Topics Concern  . None   Social History Narrative  . None   Additional Social History:    Allergies:   Allergies  Allergen Reactions  . Tetracyclines & Related Rash    Labs:  Results for orders placed or performed during the hospital encounter of 11/14/16 (from the past 48 hour(s))  CBG monitoring, ED     Status: Abnormal  Collection Time: 11/14/16  4:13 PM  Result Value Ref Range   Glucose-Capillary 170 (H) 65 - 99 mg/dL  Comprehensive metabolic panel     Status: Abnormal   Collection Time: 11/14/16  5:28 PM  Result Value Ref Range   Sodium 142 135 - 145 mmol/L   Potassium 4.1 3.5 - 5.1 mmol/L   Chloride 109 101 - 111 mmol/L   CO2 26 22 - 32 mmol/L   Glucose, Bld 138 (H) 65 - 99 mg/dL   BUN 16 6  - 20 mg/dL   Creatinine, Ser 0.88 0.44 - 1.00 mg/dL   Calcium 9.1 8.9 - 10.3 mg/dL   Total Protein 6.9 6.5 - 8.1 g/dL   Albumin 3.8 3.5 - 5.0 g/dL   AST 21 15 - 41 U/L   ALT 14 14 - 54 U/L   Alkaline Phosphatase 74 38 - 126 U/L   Total Bilirubin 0.4 0.3 - 1.2 mg/dL   GFR calc non Af Amer 60 (L) >60 mL/min   GFR calc Af Amer >60 >60 mL/min    Comment: (NOTE) The eGFR has been calculated using the CKD EPI equation. This calculation has not been validated in all clinical situations. eGFR's persistently <60 mL/min signify possible Chronic Kidney Disease.    Anion gap 7 5 - 15  Ethanol     Status: None   Collection Time: 11/14/16  5:28 PM  Result Value Ref Range   Alcohol, Ethyl (B) <5 <5 mg/dL    Comment:        LOWEST DETECTABLE LIMIT FOR SERUM ALCOHOL IS 5 mg/dL FOR MEDICAL PURPOSES ONLY   Salicylate level     Status: None   Collection Time: 11/14/16  5:28 PM  Result Value Ref Range   Salicylate Lvl <2.0 2.8 - 30.0 mg/dL  Acetaminophen level     Status: Abnormal   Collection Time: 11/14/16  5:28 PM  Result Value Ref Range   Acetaminophen (Tylenol), Serum <10 (L) 10 - 30 ug/mL    Comment:        THERAPEUTIC CONCENTRATIONS VARY SIGNIFICANTLY. A RANGE OF 10-30 ug/mL MAY BE AN EFFECTIVE CONCENTRATION FOR MANY PATIENTS. HOWEVER, SOME ARE BEST TREATED AT CONCENTRATIONS OUTSIDE THIS RANGE. ACETAMINOPHEN CONCENTRATIONS >150 ug/mL AT 4 HOURS AFTER INGESTION AND >50 ug/mL AT 12 HOURS AFTER INGESTION ARE OFTEN ASSOCIATED WITH TOXIC REACTIONS.   cbc     Status: Abnormal   Collection Time: 11/14/16  5:28 PM  Result Value Ref Range   WBC 4.6 4.0 - 10.5 K/uL   RBC 3.86 (L) 3.87 - 5.11 MIL/uL   Hemoglobin 10.8 (L) 12.0 - 15.0 g/dL   HCT 33.9 (L) 36.0 - 46.0 %   MCV 87.8 78.0 - 100.0 fL   MCH 28.0 26.0 - 34.0 pg   MCHC 31.9 30.0 - 36.0 g/dL   RDW 13.3 11.5 - 15.5 %   Platelets 230 150 - 400 K/uL  Rapid urine drug screen (hospital performed)     Status: None   Collection  Time: 11/14/16  7:14 PM  Result Value Ref Range   Opiates NONE DETECTED NONE DETECTED   Cocaine NONE DETECTED NONE DETECTED   Benzodiazepines NONE DETECTED NONE DETECTED   Amphetamines NONE DETECTED NONE DETECTED   Tetrahydrocannabinol NONE DETECTED NONE DETECTED   Barbiturates NONE DETECTED NONE DETECTED    Comment:        DRUG SCREEN FOR MEDICAL PURPOSES ONLY.  IF CONFIRMATION IS NEEDED FOR ANY PURPOSE, NOTIFY LAB WITHIN 5 DAYS.  LOWEST DETECTABLE LIMITS FOR URINE DRUG SCREEN Drug Class       Cutoff (ng/mL) Amphetamine      1000 Barbiturate      200 Benzodiazepine   409 Tricyclics       811 Opiates          300 Cocaine          300 THC              50   Urinalysis, Routine w reflex microscopic- may I&O cath if menses     Status: None   Collection Time: 11/14/16  7:14 PM  Result Value Ref Range   Color, Urine YELLOW YELLOW   APPearance CLEAR CLEAR   Specific Gravity, Urine 1.013 1.005 - 1.030   pH 6.0 5.0 - 8.0   Glucose, UA NEGATIVE NEGATIVE mg/dL   Hgb urine dipstick NEGATIVE NEGATIVE   Bilirubin Urine NEGATIVE NEGATIVE   Ketones, ur NEGATIVE NEGATIVE mg/dL   Protein, ur NEGATIVE NEGATIVE mg/dL   Nitrite NEGATIVE NEGATIVE   Leukocytes, UA NEGATIVE NEGATIVE    Current Facility-Administered Medications  Medication Dose Route Frequency Provider Last Rate Last Dose  . acetaminophen (TYLENOL) tablet 650 mg  650 mg Oral Q4H PRN Lacretia Leigh, MD      . aspirin chewable tablet 81 mg  81 mg Oral Daily Lacretia Leigh, MD   81 mg at 11/15/16 9147  . atorvastatin (LIPITOR) tablet 20 mg  20 mg Oral QPM Lacretia Leigh, MD   20 mg at 11/14/16 2141  . clopidogrel (PLAVIX) tablet 75 mg  75 mg Oral Daily Lacretia Leigh, MD   75 mg at 11/15/16 0939  . losartan (COZAAR) tablet 50 mg  50 mg Oral Daily Lacretia Leigh, MD   50 mg at 11/15/16 0939  . metoprolol tartrate (LOPRESSOR) tablet 25 mg  25 mg Oral BID Lacretia Leigh, MD   25 mg at 11/15/16 8295  . ondansetron (ZOFRAN) tablet 4  mg  4 mg Oral Q8H PRN Lacretia Leigh, MD      . risperiDONE (RISPERDAL M-TABS) disintegrating tablet 0.5 mg  0.5 mg Oral Daily Patrecia Pour, NP       Current Outpatient Prescriptions  Medication Sig Dispense Refill  . ALPRAZolam (XANAX) 0.25 MG tablet Take 1 tablet (0.25 mg total) by mouth 2 (two) times daily as needed for anxiety or sleep (May be used on call to MRI suite.). (Patient not taking: Reported on 11/14/2016) 15 tablet 0  . aspirin 81 MG chewable tablet Chew 81 mg by mouth daily.      Marland Kitchen atorvastatin (LIPITOR) 20 MG tablet Take 20 mg by mouth every evening.    . clopidogrel (PLAVIX) 75 MG tablet Take 75 mg by mouth daily.      . cyanocobalamin (,VITAMIN B-12,) 1000 MCG/ML injection Inject 1 mL (1,000 mcg total) into the skin daily. Take 1081mg SQ daily x 1 week, then 10036m sq weekly x 1 month, then 100063m SQ monthly. (Patient not taking: Reported on 11/14/2016) 25 mL 0  . furosemide (LASIX) 40 MG tablet Take 1 tablet (40 mg total) by mouth daily. (Patient not taking: Reported on 11/14/2016) 30 tablet   . losartan (COZAAR) 50 MG tablet Take 50 mg by mouth daily.      . metoprolol tartrate (LOPRESSOR) 25 MG tablet Take 25 mg by mouth 2 (two) times daily.      . Nutritional Supplements (NUTRITIONAL SUPPLEMENT PO) Take 120 mLs by mouth 2 (two) times daily.    .Marland Kitchen  QUEtiapine (SEROQUEL) 25 MG tablet Take 1 tablet (25 mg total) by mouth at bedtime. (Patient not taking: Reported on 11/14/2016) 30 tablet 0    Musculoskeletal: Strength & Muscle Tone: within normal limits Gait & Station: unsteady Patient leans: N/A  Psychiatric Specialty Exam: Physical Exam  Constitutional: She is oriented to person, place, and time. She appears well-developed and well-nourished.  HENT:  Head: Normocephalic.  Neck: Normal range of motion.  Respiratory: Effort normal.  Musculoskeletal: Normal range of motion.  Neurological: She is alert and oriented to person, place, and time.  Psychiatric: She has a  normal mood and affect. Her speech is normal and behavior is normal. Judgment normal. Thought content is delusional. Cognition and memory are normal.    Review of Systems  All other systems reviewed and are negative.   Blood pressure 106/63, pulse 65, temperature 98.3 F (36.8 C), temperature source Oral, resp. rate 18, SpO2 98 %.There is no height or weight on file to calculate BMI.  General Appearance: Casual  Eye Contact:  Good  Speech:  Normal Rate  Volume:  Normal  Mood:  Euthymic  Affect:  Congruent  Thought Process:  Coherent and Descriptions of Associations: Intact  Orientation:  Full (Time, Place, and Person)  Thought Content:  Delusions and psychosis after 4 pm  Suicidal Thoughts:  No  Homicidal Thoughts:  No  Memory:  Immediate;   Fair Recent;   Fair Remote;   Fair  Judgement:  Fair  Insight:  Fair  Psychomotor Activity:  Normal  Concentration:  Concentration: Fair and Attention Span: Fair  Recall:  [employer] of Knowledge:  Fair  Language:  Good  Akathisia:  No  Handed:  Right  AIMS (if indicated):     Assets:  Housing Intimacy Leisure Time Physical Health Resilience Social Support  ADL's:  Intact  Cognition:  WNL  Sleep:        Treatment Plan Summary: Daily contact with patient to assess and evaluate symptoms and progress in treatment, Medication management and Plan unspecified dementia with behavioral disturbance:  -Crisis stabilization -Medication management:  Restarted medical medications along with Risperdal 0.5 mg at 4 pm for psychosis -Individual counseling  Disposition: Recommend psychiatric Inpatient admission when medically cleared. Supportive therapy provided about ongoing stressors.  Waylan Boga, NP 11/15/2016 12:23 PM   Patient reviewed with NP and ED staff in rounds, agree with Note

## 2016-11-15 NOTE — Progress Notes (Signed)
CSW filed patient's examination paperwork into IVC logbook.  

## 2016-11-15 NOTE — ED Notes (Signed)
Call to [phone] as husband is here visiting and does not want his wife transported as far as [place]

## 2016-12-25 ENCOUNTER — Inpatient Hospital Stay (HOSPITAL_COMMUNITY)
Admission: EM | Admit: 2016-12-25 | Discharge: 2016-12-30 | DRG: 065 | Disposition: A | Discharge status: Skilled Nursing Facility | Payer: Medicare Other | Attending: Family Medicine | Admitting: Family Medicine

## 2016-12-25 ENCOUNTER — Emergency Department (HOSPITAL_COMMUNITY): Payer: Medicare Other

## 2016-12-25 ENCOUNTER — Encounter (HOSPITAL_COMMUNITY): Payer: Self-pay

## 2016-12-25 ENCOUNTER — Inpatient Hospital Stay (HOSPITAL_COMMUNITY): Payer: Medicare Other

## 2016-12-25 DIAGNOSIS — R414 Neurologic neglect syndrome: Secondary | ICD-10-CM | POA: Diagnosis present

## 2016-12-25 DIAGNOSIS — Z7902 Long term (current) use of antithrombotics/antiplatelets: Secondary | ICD-10-CM

## 2016-12-25 DIAGNOSIS — I69354 Hemiplegia and hemiparesis following cerebral infarction affecting left non-dominant side: Secondary | ICD-10-CM | POA: Diagnosis not present

## 2016-12-25 DIAGNOSIS — I2510 Atherosclerotic heart disease of native coronary artery without angina pectoris: Secondary | ICD-10-CM | POA: Diagnosis present

## 2016-12-25 DIAGNOSIS — R410 Disorientation, unspecified: Secondary | ICD-10-CM | POA: Diagnosis present

## 2016-12-25 DIAGNOSIS — I6789 Other cerebrovascular disease: Secondary | ICD-10-CM | POA: Diagnosis not present

## 2016-12-25 DIAGNOSIS — Z8701 Personal history of pneumonia (recurrent): Secondary | ICD-10-CM | POA: Diagnosis not present

## 2016-12-25 DIAGNOSIS — J209 Acute bronchitis, unspecified: Secondary | ICD-10-CM | POA: Diagnosis present

## 2016-12-25 DIAGNOSIS — I5032 Chronic diastolic (congestive) heart failure: Secondary | ICD-10-CM | POA: Diagnosis present

## 2016-12-25 DIAGNOSIS — Z87891 Personal history of nicotine dependence: Secondary | ICD-10-CM | POA: Diagnosis not present

## 2016-12-25 DIAGNOSIS — R0902 Hypoxemia: Secondary | ICD-10-CM | POA: Diagnosis present

## 2016-12-25 DIAGNOSIS — Z8249 Family history of ischemic heart disease and other diseases of the circulatory system: Secondary | ICD-10-CM | POA: Diagnosis not present

## 2016-12-25 DIAGNOSIS — Z7982 Long term (current) use of aspirin: Secondary | ICD-10-CM | POA: Diagnosis not present

## 2016-12-25 DIAGNOSIS — F0390 Unspecified dementia without behavioral disturbance: Secondary | ICD-10-CM | POA: Diagnosis present

## 2016-12-25 DIAGNOSIS — I639 Cerebral infarction, unspecified: Secondary | ICD-10-CM | POA: Diagnosis not present

## 2016-12-25 DIAGNOSIS — E785 Hyperlipidemia, unspecified: Secondary | ICD-10-CM | POA: Diagnosis present

## 2016-12-25 DIAGNOSIS — I6340 Cerebral infarction due to embolism of unspecified cerebral artery: Secondary | ICD-10-CM | POA: Diagnosis not present

## 2016-12-25 DIAGNOSIS — R7303 Prediabetes: Secondary | ICD-10-CM | POA: Diagnosis present

## 2016-12-25 DIAGNOSIS — I10 Essential (primary) hypertension: Secondary | ICD-10-CM | POA: Diagnosis not present

## 2016-12-25 DIAGNOSIS — I110 Hypertensive heart disease with heart failure: Secondary | ICD-10-CM | POA: Diagnosis present

## 2016-12-25 DIAGNOSIS — I63531 Cerebral infarction due to unspecified occlusion or stenosis of right posterior cerebral artery: Secondary | ICD-10-CM | POA: Diagnosis present

## 2016-12-25 DIAGNOSIS — H53462 Homonymous bilateral field defects, left side: Secondary | ICD-10-CM | POA: Diagnosis present

## 2016-12-25 DIAGNOSIS — I340 Nonrheumatic mitral (valve) insufficiency: Secondary | ICD-10-CM | POA: Diagnosis not present

## 2016-12-25 LAB — URINALYSIS, ROUTINE W REFLEX MICROSCOPIC
BILIRUBIN URINE: NEGATIVE
Glucose, UA: NEGATIVE mg/dL
Hgb urine dipstick: NEGATIVE
Ketones, ur: NEGATIVE mg/dL
Leukocytes, UA: NEGATIVE
NITRITE: NEGATIVE
Protein, ur: NEGATIVE mg/dL
SPECIFIC GRAVITY, URINE: 1.018 (ref 1.005–1.030)
pH: 6 (ref 5.0–8.0)

## 2016-12-25 LAB — D-DIMER, QUANTITATIVE (NOT AT ARMC): D DIMER QUANT: 0.77 ug{FEU}/mL — AB (ref 0.00–0.50)

## 2016-12-25 LAB — CBC WITH DIFFERENTIAL/PLATELET
Basophils Absolute: 0 10*3/uL (ref 0.0–0.1)
Basophils Relative: 1 %
EOS ABS: 0.1 10*3/uL (ref 0.0–0.7)
Eosinophils Relative: 2 %
HCT: 34.2 % — ABNORMAL LOW (ref 36.0–46.0)
HEMOGLOBIN: 10.7 g/dL — AB (ref 12.0–15.0)
Lymphocytes Relative: 14 %
Lymphs Abs: 0.6 10*3/uL — ABNORMAL LOW (ref 0.7–4.0)
MCH: 27.9 pg (ref 26.0–34.0)
MCHC: 31.3 g/dL (ref 30.0–36.0)
MCV: 89.3 fL (ref 78.0–100.0)
Monocytes Absolute: 0.3 10*3/uL (ref 0.1–1.0)
Monocytes Relative: 6 %
NEUTROS PCT: 77 %
Neutro Abs: 3.7 10*3/uL (ref 1.7–7.7)
Platelets: 268 10*3/uL (ref 150–400)
RBC: 3.83 MIL/uL — AB (ref 3.87–5.11)
RDW: 13.4 % (ref 11.5–15.5)
WBC: 4.7 10*3/uL (ref 4.0–10.5)

## 2016-12-25 LAB — COMPREHENSIVE METABOLIC PANEL
ALBUMIN: 3.5 g/dL (ref 3.5–5.0)
ALK PHOS: 86 U/L (ref 38–126)
ALT: 20 U/L (ref 14–54)
ANION GAP: 9 (ref 5–15)
AST: 17 U/L (ref 15–41)
BUN: 17 mg/dL (ref 6–20)
CALCIUM: 8.9 mg/dL (ref 8.9–10.3)
CO2: 26 mmol/L (ref 22–32)
Chloride: 106 mmol/L (ref 101–111)
Creatinine, Ser: 0.9 mg/dL (ref 0.44–1.00)
GFR calc Af Amer: >60 mL/min (ref >60)
GFR calc non Af Amer: 58 mL/min — ABNORMAL LOW (ref >60)
GLUCOSE: 139 mg/dL — AB (ref 65–99)
Potassium: 3.8 mmol/L (ref 3.5–5.1)
SODIUM: 141 mmol/L (ref 135–145)
Total Bilirubin: 0.6 mg/dL (ref 0.3–1.2)
Total Protein: 6.8 g/dL (ref 6.5–8.1)

## 2016-12-25 LAB — I-STAT TROPONIN, ED
Troponin i, poc: 0 ng/mL (ref 0.00–0.08)
Troponin i, poc: 0.01 ng/mL (ref 0.00–0.08)

## 2016-12-25 LAB — PROTIME-INR
INR: 1
Prothrombin Time: 13.2 seconds (ref 11.4–15.2)

## 2016-12-25 LAB — AMMONIA: Ammonia: 42 umol/L — ABNORMAL HIGH (ref 9–35)

## 2016-12-25 LAB — I-STAT CG4 LACTIC ACID, ED: Lactic Acid, Venous: 0.89 mmol/L (ref 0.5–1.9)

## 2016-12-25 LAB — BRAIN NATRIURETIC PEPTIDE: B NATRIURETIC PEPTIDE 5: 198.4 pg/mL — AB (ref 0.0–100.0)

## 2016-12-25 LAB — LACTIC ACID, PLASMA: LACTIC ACID, VENOUS: 1.2 mmol/L (ref 0.5–1.9)

## 2016-12-25 MED ORDER — ACETAMINOPHEN 650 MG RE SUPP: 650.0000 mg | RECTAL | Status: DC | PRN

## 2016-12-25 MED ORDER — ENOXAPARIN SODIUM 40 MG/0.4ML ~~LOC~~ SOLN
40.0000 mg | SUBCUTANEOUS | Status: DC
  Administered 2016-12-26 – 2016-12-29 (×4): 40 mg via SUBCUTANEOUS
  Filled 2016-12-25 (×4): qty 0.4

## 2016-12-25 MED ORDER — IOPAMIDOL (ISOVUE-370) INJECTION 76%
INTRAVENOUS | Status: AC
  Administered 2016-12-25: 100 mL
  Filled 2016-12-25: qty 100

## 2016-12-25 MED ORDER — DEXTROSE 5 % IV SOLN
1.0000 g | Freq: Once | INTRAVENOUS | Status: AC
  Administered 2016-12-25: 1 g via INTRAVENOUS
  Filled 2016-12-25: qty 10

## 2016-12-25 MED ORDER — ACETAMINOPHEN 325 MG PO TABS: 650.0000 mg | ORAL_TABLET | ORAL | Status: DC | PRN

## 2016-12-25 MED ORDER — STROKE: EARLY STAGES OF RECOVERY BOOK
Freq: Once | Status: DC
  Filled 2016-12-25: qty 1

## 2016-12-25 MED ORDER — DEXTROSE 5 % IV SOLN
500.0000 mg | Freq: Once | INTRAVENOUS | Status: AC
  Administered 2016-12-25: 500 mg via INTRAVENOUS
  Filled 2016-12-25: qty 500

## 2016-12-25 MED ORDER — ACETAMINOPHEN 160 MG/5ML PO SOLN: 650.0000 mg | ORAL | Status: DC | PRN

## 2016-12-25 NOTE — ED Notes (Signed)
This RN attempted IV x 2 without success 

## 2016-12-25 NOTE — ED Provider Notes (Signed)
[facility]ER[facility]ENCY DEPT Provider Note   CSN: [id number] Arrival date & time: 12/25/16  1608     History   Chief Complaint Chief Complaint  Patient presents with  . Fall    HPI [name] is a 83 y.o. female with a past medical history significant for dementia, prior stroke with no residual symptoms on aspirin and Plavix, hypertension, CAD, CHF on Lasix, and recent pneumonia who presents with a fall, confusion, left sided neglect, cough, and hypoxia. History was provided by both patient and her husband who was called. According to husband, patient was last normal last night. He reports that this morning, patient spent several hours in the bathroom sitting. He says that when he checked on her, she was leaning to the right and turning towards the right constantly. He reports that this is new and different. He says that she had a unwitnessed fall but denied any head injury or headache. He reports that she has had a cough with some mucus production.  He reports that she does not use oxygen at baseline. She does report some generalized leg edema.  Patient denies any symptoms. She denies any headache, neck pain, chest pain, or abdominal pain. She denies any pain in her extremities. Denies any residual symptoms from her prior stroke.       HPI  Past Medical History:  Diagnosis Date  . CHF (congestive heart failure) (HCC)   . Coronary artery disease   . Hypertension   . 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 Benign hypertensive heart disease without heart failure 06/19/2016  . Chronic CHF (congestive heart failure) (HCC) 06/19/2016  . B12 deficiency 06/17/2016  . Metabolic encephalopathy 06/15/2016  . 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 Hyperlipidemia 06/14/2016  . Hypokalemia 06/14/2016  . UTI (urinary tract infection) 06/14/2016  . Unspecified dementia with behavioral disturbance 06/14/2016    No past surgical history on  file.  OB History    No data available       Home Medications    Prior to Admission medications   Medication Sig Start Date End Date Taking? Authorizing Provider  ALPRAZolam Prudy Feeler(XANAX) 0.25 MG tablet Take 1 tablet (0.25 mg total) by mouth 2 (two) times daily as needed for anxiety or sleep (May be used on call to MRI suite.). Patient not taking: Reported on 11/14/2016 06/17/16   Rodolph Bonganiel Thompson V, MD  aspirin 81 MG chewable tablet Chew 81 mg by mouth daily.      Historical Provider, MD  atorvastatin (LIPITOR) 20 MG tablet Take 20 mg by mouth every evening.    Historical Provider, MD  clopidogrel (PLAVIX) 75 MG tablet Take 75 mg by mouth daily.      Historical Provider, MD  cyanocobalamin (,VITAMIN B-12,) 1000 MCG/ML injection Inject 1 mL (1,000 mcg total) into the skin daily. Take 1000mcg SQ daily x 1 week, then 1000mcg sq weekly x 1 month, then 1000mcg  SQ monthly. Patient not taking: Reported on 11/14/2016 06/17/16   Rodolph Bonganiel Thompson V, MD  furosemide (LASIX) 40 MG tablet Take 1 tablet (40 mg total) by mouth daily. Patient not taking: Reported on 11/14/2016 06/17/16   Rodolph Bonganiel Thompson V, MD  losartan (COZAAR) 50 MG tablet Take 50 mg by mouth daily.      Historical Provider, MD  metoprolol tartrate (LOPRESSOR) 25 MG tablet Take 25 mg by mouth 2 (two) times daily.      Historical Provider, MD  Nutritional Supplements (NUTRITIONAL SUPPLEMENT PO) Take 120 mLs by mouth 2 (two) times daily.    Historical Provider, MD  QUEtiapine (SEROQUEL) 25 MG tablet Take 1 tablet (25 mg total) by mouth at bedtime. Patient not taking: Reported on 11/14/2016 06/17/16   Rodolph Bong, MD    Family History Family History  Problem Relation Age of Onset  . Heart disease Mother     Social History Social History  Substance Use Topics  . Smoking status: Never Smoker  . Smokeless tobacco: Never Used  . Alcohol use No     Allergies   Tetracyclines & related   Review of Systems Review of Systems    Constitutional: Negative for activity change, chills, diaphoresis, fatigue and fever.  HENT: Negative for congestion and rhinorrhea.   Eyes: Negative for visual disturbance.  Respiratory: Positive for cough and shortness of breath. Negative for chest tightness and stridor.   Cardiovascular: Negative for chest pain, palpitations and leg swelling.  Gastrointestinal: Negative for abdominal distention, abdominal pain, constipation, diarrhea, nausea and vomiting.  Genitourinary: Negative for difficulty urinating, dysuria, flank pain, frequency, hematuria, menstrual problem, pelvic pain, vaginal bleeding and vaginal discharge.  Musculoskeletal: Negative for back pain and neck pain.  Skin: Negative for rash and wound.  Neurological: Negative for dizziness, syncope, speech difficulty, weakness, light-headedness, numbness and headaches.  Psychiatric/Behavioral: Positive for confusion (per husband). Negative for agitation.  All other systems reviewed and are negative.    Physical Exam Updated Vital Signs BP 153/73 (BP Location: Right Arm)   Pulse 103   Temp 98.3 F (36.8 C) (Oral)   Resp 16   SpO2 (!) 84%   Physical Exam  Constitutional: She is oriented to person, place, and time. She appears well-developed and well-nourished. No distress.  HENT:  Head: Normocephalic and atraumatic.  Right Ear: External ear normal.  Left Ear: External ear normal.  Nose: Nose normal.  Mouth/Throat: Oropharynx is clear and moist. No oropharyngeal exudate.  Eyes: Conjunctivae are normal. Pupils are equal, round, and reactive to light. Right eye exhibits abnormal extraocular motion. Left eye exhibits abnormal extraocular motion.  Patient will not turn her eyes to the left. Pupils are reactive bilaterally.   Neck: Normal range of motion. Neck supple.  Patient is turning her head to the right constantly. Patient is able to turn left when directed to do so.  Cardiovascular: Intact distal pulses.  Tachycardia  present.   Murmur heard. Pulmonary/Chest: Effort normal. No stridor. Tachypnea noted. No respiratory distress. She has rhonchi.  Patient hypoxic on arrival. Patient on 4 L nasal cannula.  Abdominal: She exhibits no distension. There is no tenderness. There is no rebound.  Neurological: She is alert and oriented to person, place, and time. She has normal strength and normal reflexes. She is not disoriented. She displays no tremor. A cranial nerve deficit is present. No sensory deficit. She exhibits normal muscle tone. She displays no seizure activity. Coordination normal. GCS eye subscore is 4. GCS verbal subscore is 5. GCS motor subscore is 6.  Left-sided neglect. Patient will not move her eyes to the left from midline.  Skin: Skin is warm. Capillary refill takes less than 2 seconds. No rash noted. She is not diaphoretic. No erythema.  Nursing note and vitals reviewed.    ED Treatments / Results  Labs (all labs ordered are listed, but only abnormal results are displayed) Labs Reviewed  CBC WITH DIFFERENTIAL/PLATELET - Abnormal; Notable for the following:       Result Value  RBC 3.83 (*)    Hemoglobin 10.7 (*)    HCT 34.2 (*)    Lymphs Abs 0.6 (*)    All other components within normal limits  COMPREHENSIVE METABOLIC PANEL - Abnormal; Notable for the following:    Glucose, Bld 139 (*)    GFR calc non Af Amer 58 (*)    All other components within normal limits  D-DIMER, QUANTITATIVE (NOT AT [facility]) - Abnormal; Notable for the following:    D-Dimer, Quant 0.77 (*)    All other components within normal limits  URINALYSIS, ROUTINE W REFLEX MICROSCOPIC - Abnormal; Notable for the following:    APPearance HAZY (*)    All other components within normal limits  AMMONIA - Abnormal; Notable for the following:    Ammonia 42 (*)    All other components within normal limits  BRAIN NATRIURETIC PEPTIDE - Abnormal; Notable for the following:    B Natriuretic Peptide 198.4 (*)    All other  components within normal limits  LIPID PANEL - Abnormal; Notable for the following:    LDL Cholesterol 101 (*)    All other components within normal limits  PROTIME-INR  LACTIC ACID, PLASMA  LACTIC ACID, PLASMA  HEMOGLOBIN A1C  I-STAT CG4 LACTIC ACID, ED  I-STAT TROPOININ, ED  I-STAT CG4 LACTIC ACID, ED  Rosezena Sensor, ED    EKG  EKG Interpretation None       Radiology Dg Chest 2 View  Result Date: 12/25/2016 CLINICAL DATA:  Left-sided neglect recent pneumonia with persistent cough EXAM: CHEST  2 VIEW COMPARISON:  11/14/2016, 06/15/2016 FINDINGS: Cardiomegaly with central vascular congestion. There are tiny bilateral effusions. There is a mild opacity at the lingula. No pneumothorax. Degenerative changes of the spine. Atherosclerotic calcifications of the aorta. IMPRESSION: 1. Cardiomegaly with central vascular congestion 2. Tiny bilateral effusions. Mild lingular opacity may reflect atelectasis or a small infiltrate. Electronically Signed   By: Jasmine Pang M.D.   On: 12/25/2016 17:39   Ct Head Wo Contrast  Result Date: 12/25/2016 CLINICAL DATA:  Left-sided neglect, extraocular muscles not going to the left EXAM: CT HEAD WITHOUT CONTRAST CT CERVICAL SPINE WITHOUT CONTRAST TECHNIQUE: Multidetector CT imaging of the head and cervical spine was performed following the standard protocol without intravenous contrast. Multiplanar CT image reconstructions of the cervical spine were also generated. COMPARISON:  MRI 06/15/2016, CT brain 06/14/2016 FINDINGS: CT HEAD FINDINGS Brain: Old appearing infarcts/encephalomalacia within the bilateral frontal lobes. Old left occipital lobe infarct. Old right posterior parietal infarct. Low-attenuation with suspected loss of gray-white matter differentiation in the right occipital lobe, series 6, image number 33. No hemorrhage. No focal mass. No midline shift. Moderate atrophy. Moderate-to-marked periventricular, subcortical and deep white matter small  vessel ischemic changes. Tiny old appearing lacunar infarct left thalamus. Vascular: No hyperdense vessels.  Carotid artery calcifications. Skull: No fracture.  No suspicious bone lesion. Sinuses/Orbits: Mucosal thickening in the sphenoid and ethmoid sinuses. No acute orbital abnormality. Bilateral lens extraction. Other: None CT CERVICAL SPINE FINDINGS Alignment: Straightening of the cervical spine. No subluxation. Facet alignment within normal limits. Skull base and vertebrae: Craniovertebral junction appears intact. The vertebral body heights are normal. There is no fracture. Soft tissues and spinal canal: No prevertebral fluid or swelling. No visible canal hematoma. Disc levels: Multilevel degenerate disc changes. This is moderate and most notable at C5-C6, C6-C7 and C7-T1. Multilevel bilateral facet arthropathy. Upper chest: Biapical lung scarring. Tiny hypodense thyroid nodules. Carotid artery calcifications. Other: None IMPRESSION: 1. Suspected area  of decreased attenuation/possible infarct in the right occipital lobe. No hemorrhage. Further evaluation with MRI is recommended. 2. Old appearing multifocal infarcts as described above. 3. Moderate-to-marked periventricular, subcortical and deep white matter small vessel ischemic changes 4. Straightening of the cervical spine. No acute fracture or malalignment. Electronically Signed   By: Jasmine Pang M.D.   On: 12/25/2016 19:56   Ct Angio Chest Pe W And/or Wo Contrast  Result Date: 12/25/2016 CLINICAL DATA:  83 year old presenting with acute onset of cough, showed above evaluation to have hypoxia, tachycardia head elevated D-dimer. Former smoker. Current history of hypertension and coronary artery disease. EXAM: CT ANGIOGRAPHY CHEST WITH CONTRAST TECHNIQUE: Multidetector CT imaging of the chest was performed using the standard protocol during bolus administration of intravenous contrast. Multiplanar CT image reconstructions and MIPs were obtained to evaluate  the vascular anatomy. CONTRAST:  150 mL Isovue 370 IV. COMPARISON:  None. FINDINGS: Cardiovascular: Contrast opacification of the pulmonary arteries is excellent. Respiratory motion blurs images throughout the examination. The study is of good diagnostic quality. No filling defects within either main pulmonary artery or their branches in either lung to suggest pulmonary embolism. Dilated right main pulmonary artery measuring up to 3.5 cm diameter. Mildly dilated left main pulmonary artery measuring up to 3.1 cm diameter. Heart markedly enlarged with left ventricular predominance and mild left ventricular hypertrophy. No pericardial effusion. Extensive LAD and mild right coronary artery atherosclerosis. The thoracic and upper abdominal aorta is not opacified. Moderate atherosclerosis without evidence of aneurysm. Mediastinum/Nodes: Moderate to large hiatal hernia. No pathologic lymphadenopathy. Visualized thyroid gland unremarkable. Lungs/Pleura: Scattered areas of hyperlucency throughout both lungs with other areas of relative ground-glass airspace opacity. Low lung volumes with atelectasis in the lower lobes. No confluent airspace consolidation. No pulmonary parenchymal nodules or masses. Central airways patent with marked central peribronchial thickening. Bronchiectasis in the left upper lobe. No pleural effusions. Upper Abdomen: Diverticulosis involving the visualized splenic flexure the colon. Hiatal hernia as mentioned above. Otherwise unremarkable. Musculoskeletal: DISH involving the lower thoracic spine. Mild osseous demineralization. Review of the MIP images confirms the above findings. IMPRESSION: 1. No evidence of pulmonary embolism. 2. Enlarged main pulmonary arteries bilaterally indicating longstanding pulmonary arterial hypertension. 3. Marked cardiomegaly. LAD and right coronary artery atherosclerosis. 4. Findings consistent with severe asthma and/or bronchitis. No confluent airspace pneumonia. 5.  Moderate to large hiatal hernia. Electronically Signed   By: Hulan Saas M.D.   On: 12/25/2016 21:50   Ct Cervical Spine Wo Contrast  Result Date: 12/25/2016 CLINICAL DATA:  Left-sided neglect, extraocular muscles not going to the left EXAM: CT HEAD WITHOUT CONTRAST CT CERVICAL SPINE WITHOUT CONTRAST TECHNIQUE: Multidetector CT imaging of the head and cervical spine was performed following the standard protocol without intravenous contrast. Multiplanar CT image reconstructions of the cervical spine were also generated. COMPARISON:  MRI 06/15/2016, CT brain 06/14/2016 FINDINGS: CT HEAD FINDINGS Brain: Old appearing infarcts/encephalomalacia within the bilateral frontal lobes. Old left occipital lobe infarct. Old right posterior parietal infarct. Low-attenuation with suspected loss of gray-white matter differentiation in the right occipital lobe, series 6, image number 33. No hemorrhage. No focal mass. No midline shift. Moderate atrophy. Moderate-to-marked periventricular, subcortical and deep white matter small vessel ischemic changes. Tiny old appearing lacunar infarct left thalamus. Vascular: No hyperdense vessels.  Carotid artery calcifications. Skull: No fracture.  No suspicious bone lesion. Sinuses/Orbits: Mucosal thickening in the sphenoid and ethmoid sinuses. No acute orbital abnormality. Bilateral lens extraction. Other: None CT CERVICAL SPINE FINDINGS Alignment: Straightening of the  cervical spine. No subluxation. Facet alignment within normal limits. Skull base and vertebrae: Craniovertebral junction appears intact. The vertebral body heights are normal. There is no fracture. Soft tissues and spinal canal: No prevertebral fluid or swelling. No visible canal hematoma. Disc levels: Multilevel degenerate disc changes. This is moderate and most notable at C5-C6, C6-C7 and C7-T1. Multilevel bilateral facet arthropathy. Upper chest: Biapical lung scarring. Tiny hypodense thyroid nodules. Carotid artery  calcifications. Other: None IMPRESSION: 1. Suspected area of decreased attenuation/possible infarct in the right occipital lobe. No hemorrhage. Further evaluation with MRI is recommended. 2. Old appearing multifocal infarcts as described above. 3. Moderate-to-marked periventricular, subcortical and deep white matter small vessel ischemic changes 4. Straightening of the cervical spine. No acute fracture or malalignment. Electronically Signed   By: Jasmine Pang M.D.   On: 12/25/2016 19:56   Mr Brain Wo Contrast  Result Date: 12/25/2016 CLINICAL DATA:  83 y/o  F; left-sided neglect. EXAM: MRI HEAD WITHOUT CONTRAST TECHNIQUE: Multiplanar, multiecho pulse sequences of the brain and surrounding structures were obtained without intravenous contrast. COMPARISON:  12/25/2016 CT of the head.  06/15/2016 MRI of the head. FINDINGS: Brain: Right posteromedial temporal, occipital, and hippocampus diffusion restriction compatible with acute right PCA territory infarction. There additional punctate foci of diffusion restriction within the right caudate head, right thalamus, and right globus pallidus also likely representing acute ischemia. The region of infarction demonstrates mild T2 FLAIR hyperintense signal abnormality without significant mass effect. No hemorrhage identified. Background of advanced chronic microvascular ischemic changes and parenchymal volume loss of the brain. Chronic left occipital, and bilateral frontal small cortical infarctions. Vascular: Poorly visualized due to motion artifact. Skull and upper cervical spine: Normal marrow signal. Sinuses/Orbits: Negative. Other: None. IMPRESSION: 1. Extensive motion artifact. 2. Right PCA distribution acute infarction. Additional punctate acute infarcts within the right basal ganglia. No definite associated hemorrhage identified. 3. Background of advanced chronic microvascular ischemic changes, parenchymal volume loss, and small chronic cortical infarcts. These  results will be called to the ordering clinician or representative by the Radiologist Assistant, and communication documented in the PACS or zVision Dashboard. Electronically Signed   By: Mitzi Hansen M.D.   On: 12/25/2016 22:28   Mr Maxine Glenn Head/brain ZO Cm  Result Date: 12/26/2016 CLINICAL DATA:  Initial evaluation for acute stroke. EXAM: MRA HEAD WITHOUT CONTRAST TECHNIQUE: Angiographic images of the Circle of Willis were obtained using MRA technique without intravenous contrast. COMPARISON:  Prior MRI from earlier the same day and MRA from 06/15/2016 FINDINGS: ANTERIOR CIRCULATION: Study is moderately degraded by motion artifact. Distal cervical segments of the internal carotid arteries are widely patent to the skullbase. Petrous, cavernous, supraclinoid segments patent bilaterally without flow-limiting stenosis. A1 segments patent bilaterally. Anterior communicating artery grossly normal. Anterior cerebral arteries patent to their distal aspects without obvious stenosis. M1 segments patent without stenosis or occlusion. Probable occlusion and/or severe stenosis of the lateral division of the right MCA again noted (series 350, image 16), similar to previous. Distal MCA branches otherwise fairly symmetric. POSTERIOR CIRCULATION: Left vertebral artery dominant and patent to the vertebrobasilar junction. Right vertebral artery diffusely hypoplastic, and appears to terminate in PICA. Basilar artery tortuous with mild irregularity and narrowing proximally. Basilar artery otherwise widely patent to its distal aspect. Superior cerebral artery is patent proximally, not well evaluated distally. Left PCA supplied via the basilar artery via a patent P1 segment. Small left posterior communicating artery noted as well. Superimposed severe distal left P2 stenosis (series 354, image 8), similar to  prior. Left PCA attenuated distally, and may be occluded, similar. Fetal type right PCA supplied via the right posterior  communicating artery. There is proximal right P2 occlusion (series 3, image 107), compatible with acute right PCA infarct. No aneurysm or vascular malformation. IMPRESSION: 1. Abrupt proximal right P2 occlusion, compatible with acute right PCA territory infarct. Right PCA is of fetal type. 2. Severe distal left P2 stenosis, with probable distal left PCA occlusion, stable. 3. Severe stenosis/occlusion of the lateral branch of the right MCA, stable. 4. Mild atheromatous irregularity within the proximal basilar artery without flow-limiting stenosis, also stable. Electronically Signed   By: Rise Mu M.D.   On: 12/26/2016 00:37    Procedures Procedures (including critical care time)  CRITICAL CARE Performed by: Canary Brim Dorean Hiebert Total critical care time: 45 minutes Critical care time was exclusive of separately billable procedures and treating other patients. Critical care was necessary to treat or prevent imminent or life-threatening deterioration. Critical care was time spent personally by me on the following activities: development of treatment plan with patient and/or surrogate as well as nursing, discussions with consultants, evaluation of patient's response to treatment, examination of patient, obtaining history from patient or surrogate, ordering and performing treatments and interventions, ordering and review of laboratory studies, ordering and review of radiographic studies, pulse oximetry and re-evaluation of patient's condition.   Medications Ordered in ED Medications   stroke: mapping our early stages of recovery book (not administered)  acetaminophen (TYLENOL) tablet 650 mg (not administered)    Or  acetaminophen (TYLENOL) solution 650 mg (not administered)    Or  acetaminophen (TYLENOL) suppository 650 mg (not administered)  enoxaparin (LOVENOX) injection 40 mg (40 mg Subcutaneous Given 12/26/16 1053)  azithromycin (ZITHROMAX) 500 mg in dextrose 5 % 250 mL IVPB (not  administered)  atorvastatin (LIPITOR) tablet 20 mg (not administered)  clopidogrel (PLAVIX) tablet 75 mg (75 mg Oral Given 12/26/16 1053)  aspirin chewable tablet 81 mg (81 mg Oral Given 12/26/16 1053)  cefTRIAXone (ROCEPHIN) 1 g in dextrose 5 % 50 mL IVPB (0 g Intravenous Stopped 12/25/16 2308)  azithromycin (ZITHROMAX) 500 mg in dextrose 5 % 250 mL IVPB (500 mg Intravenous Transfusing/Transfer 12/26/16 0035)  iopamidol (ISOVUE-370) 76 % injection (100 mLs  Contrast Given 12/25/16 2121)     Initial Impression / Assessment and Plan / ED Course  I have reviewed the triage vital signs and the nursing notes.  Pertinent labs & imaging results that were available during my care of the patient were reviewed by me and considered in my medical decision making (see chart for details).     ANNI HOCEVAR is a 83 y.o. female with a past medical history significant for dementia, prior stroke with no residual symptoms on aspirin and Plavix, hypertension, CAD, CHF on Lasix, and recent pneumonia who presents with a fall, confusion, left sided neglect, cough, and hypoxia.   History and exam are seen above.  On exam, patient had left-sided neglect. Patient is turning to the right with her head and focus. Patient will not move her eyes past the midline going left. Husband reports that her last normal was last night. Patient able to move all extremities and had symmetric grip strength. Patient was able to do finger-nose-finger without difficulty when she could see me on the right. Patient had no sensation problems. Patient had coarse breath sounds. Patient was hypoxic and placed on 4 L nasal cannula. Patient was tachycardic.   Given last normal yesterday, patient will not  be a code stroke initially. Patient will have a head CT to look for traumatic injury from the fall, bleed as she is on Plavix and aspirin, or stroke. If imaging is negative, suspect patient will need MRI.   Given hypoxia, cough, shortness of breath,  patient will have workup to look for pneumonia or pulmonary embolism. D-dimer was sent and was positive. CT PE study was ordered.  Chest x-ray shows pneumonia. Patient given antibiotics.  PE study revealed no pneumonia or pulmonary embolism. Will defer to admitting team for further antibiotic management.  CT scan shows concern for stroke. MRI ordered and stroke was confirmed. Neurology was called.   Patient will be admitted to hospitalist serviceFor further management of hypoxia and stroke.    Final Clinical Impressions(s) / ED Diagnoses   Final diagnoses:  Acute ischemic stroke [facility])    New Prescriptions Current Discharge Medication List      Clinical Impression: 1. Acute ischemic stroke [facility])     Disposition: Admit to Hospitalist service    Heide Scales, MD 12/26/16 1113

## 2016-12-25 NOTE — ED Notes (Signed)
Patient transported to MRI 

## 2016-12-25 NOTE — ED Notes (Signed)
Patient transported to CT 

## 2016-12-25 NOTE — ED Triage Notes (Signed)
Patient fell at home on carpet. Did not hit her head, but has been more confused lately and "not acting like herself' per her husband. She is able to follow commands. Was D/C from a rehab on Sunday for a similar event

## 2016-12-25 NOTE — ED Notes (Signed)
Waiting for IV team °

## 2016-12-25 NOTE — ED Notes (Signed)
Patient transported to CT/MRI 

## 2016-12-25 NOTE — ED Notes (Signed)
Husband at bedside.  

## 2016-12-26 ENCOUNTER — Inpatient Hospital Stay (HOSPITAL_COMMUNITY): Payer: Medicare Other

## 2016-12-26 DIAGNOSIS — J209 Acute bronchitis, unspecified: Secondary | ICD-10-CM | POA: Diagnosis present

## 2016-12-26 DIAGNOSIS — I6789 Other cerebrovascular disease: Secondary | ICD-10-CM

## 2016-12-26 DIAGNOSIS — I639 Cerebral infarction, unspecified: Secondary | ICD-10-CM

## 2016-12-26 LAB — LIPID PANEL
CHOL/HDL RATIO: 3.6 ratio
Cholesterol: 161 mg/dL (ref 0–200)
HDL: 45 mg/dL (ref >40)
LDL Cholesterol: 101 mg/dL — ABNORMAL HIGH (ref 0–99)
Triglycerides: 77 mg/dL (ref <150)
VLDL: 15 mg/dL (ref 0–40)

## 2016-12-26 LAB — VAS US CAROTID
LCCADDIAS: -15 cm/s
LCCADSYS: -66 cm/s
LCCAPDIAS: 13 cm/s
LCCAPSYS: 101 cm/s
LEFT ECA DIAS: -7 cm/s
LEFT VERTEBRAL DIAS: 13 cm/s
LICADSYS: -76 cm/s
Left ICA dist dias: -21 cm/s
Left ICA prox dias: 7 cm/s
Left ICA prox sys: 68 cm/s
RCCADSYS: -92 cm/s
RCCAPDIAS: -13 cm/s
RCCAPSYS: -106 cm/s
RIGHT ECA DIAS: -28 cm/s
RIGHT VERTEBRAL DIAS: -12 cm/s

## 2016-12-26 LAB — LACTIC ACID, PLASMA: LACTIC ACID, VENOUS: 1.6 mmol/L (ref 0.5–1.9)

## 2016-12-26 LAB — ECHOCARDIOGRAM COMPLETE

## 2016-12-26 MED ORDER — ASPIRIN 81 MG PO CHEW
81.0000 mg | CHEWABLE_TABLET | Freq: Every day | ORAL | Status: DC
  Administered 2016-12-26 – 2016-12-29 (×4): 81 mg via ORAL
  Filled 2016-12-26 (×4): qty 1

## 2016-12-26 MED ORDER — ATORVASTATIN CALCIUM 10 MG PO TABS: 20.0000 mg | ORAL_TABLET | Freq: Every evening | ORAL | Status: DC

## 2016-12-26 MED ORDER — CLOPIDOGREL BISULFATE 75 MG PO TABS
75.0000 mg | ORAL_TABLET | Freq: Every day | ORAL | Status: DC
  Administered 2016-12-26 – 2016-12-29 (×4): 75 mg via ORAL
  Filled 2016-12-26 (×4): qty 1

## 2016-12-26 MED ORDER — AZITHROMYCIN 500 MG IV SOLR
500.0000 mg | INTRAVENOUS | Status: DC
  Administered 2016-12-26 – 2016-12-29 (×4): 500 mg via INTRAVENOUS
  Filled 2016-12-26 (×5): qty 500

## 2016-12-26 MED ORDER — ATORVASTATIN CALCIUM 40 MG PO TABS
40.0000 mg | ORAL_TABLET | Freq: Every evening | ORAL | Status: DC
  Administered 2016-12-26: 40 mg via ORAL
  Filled 2016-12-26: qty 1

## 2016-12-26 NOTE — Progress Notes (Signed)
Patient arrived to unit via ED staff with belongings at bedside. Unable to complete admission due to disorientation. Vitals stable. Tele applied and verified. Continue to monitor patient

## 2016-12-26 NOTE — Consult Note (Signed)
Referring Physician: Dr. Alcario Drought    Chief Complaint: Fall and confusion with left hemineglect  HPI: [name] is an 83 y.o. female who presented on Thursday in the late afternoon after a fall at home onto a carpet. She did not hit her head. Per her husband, she has been more confused recently and "not acting like herself". Per husband, LKN with regards to her acute neurological change was Wednesday night. On Thursday AM, she spent several hours sitting in the bathroom. When her husband checked on her, she was leaning to the right and persistently turning towards the right. On initial ED assessment she was confused with left sided neglect. Cough and hypoxia were also noted. Her hypoxia improved with O2 via nasal cannula.   CT head revealed an area of decreased attenuation/possible infarct in the right occipital lobe with no hemorrhage. Also noted were old appearing multifocal infarcts, moderate-to-marked periventricular, subcortical and deep white matter small vessel ischemic changes and atrophy.   MRI was then obtained, revealing a right PCA distribution subacute infarction. Additional punctate acute infarcts were also seen within the right basal ganglia. No definite associated hemorrhage identified. These findings were seen on a background of advanced chronic microvascular ischemic changes, parenchymal volume loss, and small chronic cortical infarcts.   Her PMHx includes dementia, CAD, HTN, CHF, recent pneumonia and stroke without residual deficit.   LSN: Tuesday night tPA Given: No: Out of time window.   Past Medical History:  Diagnosis Date  . CHF (congestive heart failure) ([place])   . Coronary artery disease   . Hypertension   . Stroke (cerebrum) ([place]) 06/14/2016    History reviewed. No pertinent surgical history.  Family History  Problem Relation Age of Onset  . Heart disease Mother    Social History:  reports that she has quit smoking. She has never used smokeless tobacco.  She reports that she does not drink alcohol or use drugs.  Allergies:  Allergies  Allergen Reactions  . Tetracyclines & Related Rash    Medications:  Prior to Admission:  Prescriptions Prior to Admission  Medication Sig Dispense Refill Last Dose  . ALPRAZolam (XANAX) 0.25 MG tablet Take 1 tablet (0.25 mg total) by mouth 2 (two) times daily as needed for anxiety or sleep (May be used on call to MRI suite.). (Patient not taking: Reported on 11/14/2016) 15 tablet 0 Not Taking at Unknown time  . aspirin 81 MG chewable tablet Chew 81 mg by mouth daily.     Not Taking at Unknown time  . atorvastatin (LIPITOR) 20 MG tablet Take 20 mg by mouth every evening.   Not Taking at Unknown time  . clopidogrel (PLAVIX) 75 MG tablet Take 75 mg by mouth daily.     Not Taking at Unknown time  . cyanocobalamin (,VITAMIN B-12,) 1000 MCG/ML injection Inject 1 mL (1,000 mcg total) into the skin daily. Take 1076mg SQ daily x 1 week, then 10014m sq weekly x 1 month, then 100040m SQ monthly. (Patient not taking: Reported on 11/14/2016) 25 mL 0 Not Taking at Unknown time  . furosemide (LASIX) 40 MG tablet Take 1 tablet (40 mg total) by mouth daily. (Patient not taking: Reported on 11/14/2016) 30 tablet  Not Taking at Unknown time  . losartan (COZAAR) 50 MG tablet Take 50 mg by mouth daily.     Not Taking at Unknown time  . metoprolol tartrate (LOPRESSOR) 25 MG tablet Take 25 mg by mouth 2 (two) times daily.  Not Taking at Unknown time  . Nutritional Supplements (NUTRITIONAL SUPPLEMENT PO) Take 120 mLs by mouth 2 (two) times daily.   Not Taking at Unknown time  . QUEtiapine (SEROQUEL) 25 MG tablet Take 1 tablet (25 mg total) by mouth at bedtime. (Patient not taking: Reported on 11/14/2016) 30 tablet 0 Not Taking at Unknown time   Scheduled: .  stroke: mapping our early stages of recovery book   Does not apply Once  . aspirin  81 mg Oral Daily  . atorvastatin  20 mg Oral QPM  . azithromycin  500 mg Intravenous Q24H   . clopidogrel  75 mg Oral Daily  . enoxaparin (LOVENOX) injection  40 mg Subcutaneous Q24H    ROS: The patient denies new symptoms but ROS is unreliable due to left sided neglect and dementia.   Physical Examination: Blood pressure 124/57, pulse 63, temperature 98.3 F (36.8 C), resp. rate 16, SpO2 100 %.  HEENT: [place]/AT Lungs: Respirations unlabored Ext: Left > right lower extremity edema with chronic pigmentary/trophic changes.  Neurologic Examination: Mental Status: Awake, decreased attention. Oriented to city, state and 2018; not oriented to day or month. Limited speech output is fluent. Some difficulty with naming. Follows simple motor commands. Some difficulty following complex motor commands. Left hemineglect.  Cranial Nerves: II:  Left homonymous hemianopsia. PERRL.  III,IV, VI: ptosis not present. Mild exotropia noted. Eyes tend to deviate to right. Has difficulty crossing past the midline to the left but does so transiently.  V,VII: No definite facial droop. Decreased temperature and FT sensation on left.   VIII: hearing intact to questions and commands IX,X: Mild hypophonia XI: Head preferentially rotates to right.  XII: midline tongue extension  Motor: Right : Upper extremity   5/5  Left:     Upper extremity   4/5 with increased latency of motor response  Lower extremity   4+/5   Lower extremity   3-4/5 with increased latency of motor response Sensory: Decreased temperature and FT sensation to LUE and LLE. Unreliable/inconsistent answers when assessing for extinction. Deep Tendon Reflexes:  1+ bilateral brachioradialis and biceps. Hypoactive patellar and achilles reflexes bilaterally.  Cerebellar: Difficulty following commands. Bradykinetic bilaterally without gross ataxia. LUE action tremor noted.  Gait: Deferred due to falls risk concerns  Results for orders placed or performed during the hospital encounter of 12/25/16 (from the past 48 hour(s))  CBC with Differential      Status: Abnormal   Collection Time: 12/25/16  4:55 PM  Result Value Ref Range   WBC 4.7 4.0 - 10.5 K/uL   RBC 3.83 (L) 3.87 - 5.11 MIL/uL   Hemoglobin 10.7 (L) 12.0 - 15.0 g/dL   HCT 34.2 (L) 36.0 - 46.0 %   MCV 89.3 78.0 - 100.0 fL   MCH 27.9 26.0 - 34.0 pg   MCHC 31.3 30.0 - 36.0 g/dL   RDW 13.4 11.5 - 15.5 %   Platelets 268 150 - 400 K/uL   Neutrophils Relative % 77 %   Neutro Abs 3.7 1.7 - 7.7 K/uL   Lymphocytes Relative 14 %   Lymphs Abs 0.6 (L) 0.7 - 4.0 K/uL   Monocytes Relative 6 %   Monocytes Absolute 0.3 0.1 - 1.0 K/uL   Eosinophils Relative 2 %   Eosinophils Absolute 0.1 0.0 - 0.7 K/uL   Basophils Relative 1 %   Basophils Absolute 0.0 0.0 - 0.1 K/uL  Comprehensive metabolic panel     Status: Abnormal   Collection Time: 12/25/16  4:55 PM  Result Value Ref Range   Sodium 141 135 - 145 mmol/L   Potassium 3.8 3.5 - 5.1 mmol/L   Chloride 106 101 - 111 mmol/L   CO2 26 22 - 32 mmol/L   Glucose, Bld 139 (H) 65 - 99 mg/dL   BUN 17 6 - 20 mg/dL   Creatinine, Ser 8.18 0.44 - 1.00 mg/dL   Calcium 8.9 8.9 - 40.3 mg/dL   Total Protein 6.8 6.5 - 8.1 g/dL   Albumin 3.5 3.5 - 5.0 g/dL   AST 17 15 - 41 U/L   ALT 20 14 - 54 U/L   Alkaline Phosphatase 86 38 - 126 U/L   Total Bilirubin 0.6 0.3 - 1.2 mg/dL   GFR calc non Af Amer 58 (L) >60 mL/min   GFR calc Af Amer >60 >60 mL/min    Comment: (NOTE) The eGFR has been calculated using the CKD EPI equation. This calculation has not been validated in all clinical situations. eGFR's persistently <60 mL/min signify possible Chronic Kidney Disease.    Anion gap 9 5 - 15  D-dimer, quantitative (not at [facility])     Status: Abnormal   Collection Time: 12/25/16  4:55 PM  Result Value Ref Range   D-Dimer, Quant 0.77 (H) 0.00 - 0.50 ug/mL-FEU    Comment: (NOTE) At the manufacturer cut-off of 0.50 ug/mL FEU, this assay has been documented to exclude PE with a sensitivity and negative predictive value of 97 to 99%.  At this time, this  assay has not been approved by the FDA to exclude DVT/VTE. Results should be correlated with clinical presentation.   Protime-INR     Status: None   Collection Time: 12/25/16  4:55 PM  Result Value Ref Range   Prothrombin Time 13.2 11.4 - 15.2 seconds   INR 1.00   Ammonia     Status: Abnormal   Collection Time: 12/25/16  4:55 PM  Result Value Ref Range   Ammonia 42 (H) 9 - 35 umol/L  Brain natriuretic peptide     Status: Abnormal   Collection Time: 12/25/16  4:55 PM  Result Value Ref Range   B Natriuretic Peptide 198.4 (H) 0.0 - 100.0 pg/mL  I-stat troponin, ED     Status: None   Collection Time: 12/25/16  5:05 PM  Result Value Ref Range   Troponin i, poc 0.00 0.00 - 0.08 ng/mL   Comment 3            Comment: Due to the release kinetics of cTnI, a negative result within the first hours of the onset of symptoms does not rule out myocardial infarction with certainty. If myocardial infarction is still suspected, repeat the test at appropriate intervals.   I-Stat CG4 Lactic Acid, ED     Status: None   Collection Time: 12/25/16  5:07 PM  Result Value Ref Range   Lactic Acid, Venous 0.89 0.5 - 1.9 mmol/L  Urinalysis, Routine w reflex microscopic     Status: Abnormal   Collection Time: 12/25/16  5:14 PM  Result Value Ref Range   Color, Urine YELLOW YELLOW   APPearance HAZY (A) CLEAR   Specific Gravity, Urine 1.018 1.005 - 1.030   pH 6.0 5.0 - 8.0   Glucose, UA NEGATIVE NEGATIVE mg/dL   Hgb urine dipstick NEGATIVE NEGATIVE   Bilirubin Urine NEGATIVE NEGATIVE   Ketones, ur NEGATIVE NEGATIVE mg/dL   Protein, ur NEGATIVE NEGATIVE mg/dL   Nitrite NEGATIVE NEGATIVE   Leukocytes, UA NEGATIVE  NEGATIVE  Lactic acid, plasma     Status: None   Collection Time: 12/25/16  8:56 PM  Result Value Ref Range   Lactic Acid, Venous 1.2 0.5 - 1.9 mmol/L  I-stat troponin, ED     Status: None   Collection Time: 12/25/16  9:14 PM  Result Value Ref Range   Troponin i, poc 0.01 0.00 - 0.08 ng/mL    Comment 3            Comment: Due to the release kinetics of cTnI, a negative result within the first hours of the onset of symptoms does not rule out myocardial infarction with certainty. If myocardial infarction is still suspected, repeat the test at appropriate intervals.    Dg Chest 2 View  Result Date: 12/25/2016 CLINICAL DATA:  Left-sided neglect recent pneumonia with persistent cough EXAM: CHEST  2 VIEW COMPARISON:  11/14/2016, 06/15/2016 FINDINGS: Cardiomegaly with central vascular congestion. There are tiny bilateral effusions. There is a mild opacity at the lingula. No pneumothorax. Degenerative changes of the spine. Atherosclerotic calcifications of the aorta. IMPRESSION: 1. Cardiomegaly with central vascular congestion 2. Tiny bilateral effusions. Mild lingular opacity may reflect atelectasis or a small infiltrate. Electronically Signed   By: Donavan Foil M.D.   On: 12/25/2016 17:39   Ct Head Wo Contrast  Result Date: 12/25/2016 CLINICAL DATA:  Left-sided neglect, extraocular muscles not going to the left EXAM: CT HEAD WITHOUT CONTRAST CT CERVICAL SPINE WITHOUT CONTRAST TECHNIQUE: Multidetector CT imaging of the head and cervical spine was performed following the standard protocol without intravenous contrast. Multiplanar CT image reconstructions of the cervical spine were also generated. COMPARISON:  MRI 06/15/2016, CT brain 06/14/2016 FINDINGS: CT HEAD FINDINGS Brain: Old appearing infarcts/encephalomalacia within the bilateral frontal lobes. Old left occipital lobe infarct. Old right posterior parietal infarct. Low-attenuation with suspected loss of gray-white matter differentiation in the right occipital lobe, series 6, image number 33. No hemorrhage. No focal mass. No midline shift. Moderate atrophy. Moderate-to-marked periventricular, subcortical and deep white matter small vessel ischemic changes. Tiny old appearing lacunar infarct left thalamus. Vascular: No hyperdense vessels.   Carotid artery calcifications. Skull: No fracture.  No suspicious bone lesion. Sinuses/Orbits: Mucosal thickening in the sphenoid and ethmoid sinuses. No acute orbital abnormality. Bilateral lens extraction. Other: None CT CERVICAL SPINE FINDINGS Alignment: Straightening of the cervical spine. No subluxation. Facet alignment within normal limits. Skull base and vertebrae: Craniovertebral junction appears intact. The vertebral body heights are normal. There is no fracture. Soft tissues and spinal canal: No prevertebral fluid or swelling. No visible canal hematoma. Disc levels: Multilevel degenerate disc changes. This is moderate and most notable at C5-C6, C6-C7 and C7-T1. Multilevel bilateral facet arthropathy. Upper chest: Biapical lung scarring. Tiny hypodense thyroid nodules. Carotid artery calcifications. Other: None IMPRESSION: 1. Suspected area of decreased attenuation/possible infarct in the right occipital lobe. No hemorrhage. Further evaluation with MRI is recommended. 2. Old appearing multifocal infarcts as described above. 3. Moderate-to-marked periventricular, subcortical and deep white matter small vessel ischemic changes 4. Straightening of the cervical spine. No acute fracture or malalignment. Electronically Signed   By: Donavan Foil M.D.   On: 12/25/2016 19:56   Ct Angio Chest Pe W And/or Wo Contrast  Result Date: 12/25/2016 CLINICAL DATA:  83 year old presenting with acute onset of cough, showed above evaluation to have hypoxia, tachycardia head elevated D-dimer. Former smoker. Current history of hypertension and coronary artery disease. EXAM: CT ANGIOGRAPHY CHEST WITH CONTRAST TECHNIQUE: Multidetector CT imaging of the chest was  performed using the standard protocol during bolus administration of intravenous contrast. Multiplanar CT image reconstructions and MIPs were obtained to evaluate the vascular anatomy. CONTRAST:  150 mL Isovue 370 IV. COMPARISON:  None. FINDINGS: Cardiovascular: Contrast  opacification of the pulmonary arteries is excellent. Respiratory motion blurs images throughout the examination. The study is of good diagnostic quality. No filling defects within either main pulmonary artery or their branches in either lung to suggest pulmonary embolism. Dilated right main pulmonary artery measuring up to 3.5 cm diameter. Mildly dilated left main pulmonary artery measuring up to 3.1 cm diameter. Heart markedly enlarged with left ventricular predominance and mild left ventricular hypertrophy. No pericardial effusion. Extensive LAD and mild right coronary artery atherosclerosis. The thoracic and upper abdominal aorta is not opacified. Moderate atherosclerosis without evidence of aneurysm. Mediastinum/Nodes: Moderate to large hiatal hernia. No pathologic lymphadenopathy. Visualized thyroid gland unremarkable. Lungs/Pleura: Scattered areas of hyperlucency throughout both lungs with other areas of relative ground-glass airspace opacity. Low lung volumes with atelectasis in the lower lobes. No confluent airspace consolidation. No pulmonary parenchymal nodules or masses. Central airways patent with marked central peribronchial thickening. Bronchiectasis in the left upper lobe. No pleural effusions. Upper Abdomen: Diverticulosis involving the visualized splenic flexure the colon. Hiatal hernia as mentioned above. Otherwise unremarkable. Musculoskeletal: DISH involving the lower thoracic spine. Mild osseous demineralization. Review of the MIP images confirms the above findings. IMPRESSION: 1. No evidence of pulmonary embolism. 2. Enlarged main pulmonary arteries bilaterally indicating longstanding pulmonary arterial hypertension. 3. Marked cardiomegaly. LAD and right coronary artery atherosclerosis. 4. Findings consistent with severe asthma and/or bronchitis. No confluent airspace pneumonia. 5. Moderate to large hiatal hernia. Electronically Signed   By: Evangeline Dakin M.D.   On: 12/25/2016 21:50   Ct  Cervical Spine Wo Contrast  Result Date: 12/25/2016 CLINICAL DATA:  Left-sided neglect, extraocular muscles not going to the left EXAM: CT HEAD WITHOUT CONTRAST CT CERVICAL SPINE WITHOUT CONTRAST TECHNIQUE: Multidetector CT imaging of the head and cervical spine was performed following the standard protocol without intravenous contrast. Multiplanar CT image reconstructions of the cervical spine were also generated. COMPARISON:  MRI 06/15/2016, CT brain 06/14/2016 FINDINGS: CT HEAD FINDINGS Brain: Old appearing infarcts/encephalomalacia within the bilateral frontal lobes. Old left occipital lobe infarct. Old right posterior parietal infarct. Low-attenuation with suspected loss of gray-white matter differentiation in the right occipital lobe, series 6, image number 33. No hemorrhage. No focal mass. No midline shift. Moderate atrophy. Moderate-to-marked periventricular, subcortical and deep white matter small vessel ischemic changes. Tiny old appearing lacunar infarct left thalamus. Vascular: No hyperdense vessels.  Carotid artery calcifications. Skull: No fracture.  No suspicious bone lesion. Sinuses/Orbits: Mucosal thickening in the sphenoid and ethmoid sinuses. No acute orbital abnormality. Bilateral lens extraction. Other: None CT CERVICAL SPINE FINDINGS Alignment: Straightening of the cervical spine. No subluxation. Facet alignment within normal limits. Skull base and vertebrae: Craniovertebral junction appears intact. The vertebral body heights are normal. There is no fracture. Soft tissues and spinal canal: No prevertebral fluid or swelling. No visible canal hematoma. Disc levels: Multilevel degenerate disc changes. This is moderate and most notable at C5-C6, C6-C7 and C7-T1. Multilevel bilateral facet arthropathy. Upper chest: Biapical lung scarring. Tiny hypodense thyroid nodules. Carotid artery calcifications. Other: None IMPRESSION: 1. Suspected area of decreased attenuation/possible infarct in the right  occipital lobe. No hemorrhage. Further evaluation with MRI is recommended. 2. Old appearing multifocal infarcts as described above. 3. Moderate-to-marked periventricular, subcortical and deep white matter small vessel ischemic changes 4. Straightening  of the cervical spine. No acute fracture or malalignment. Electronically Signed   By: Donavan Foil M.D.   On: 12/25/2016 19:56   Mr Brain Wo Contrast  Result Date: 12/25/2016 CLINICAL DATA:  83 y/o  F; left-sided neglect. EXAM: MRI HEAD WITHOUT CONTRAST TECHNIQUE: Multiplanar, multiecho pulse sequences of the brain and surrounding structures were obtained without intravenous contrast. COMPARISON:  12/25/2016 CT of the head.  06/15/2016 MRI of the head. FINDINGS: Brain: Right posteromedial temporal, occipital, and hippocampus diffusion restriction compatible with acute right PCA territory infarction. There additional punctate foci of diffusion restriction within the right caudate head, right thalamus, and right globus pallidus also likely representing acute ischemia. The region of infarction demonstrates mild T2 FLAIR hyperintense signal abnormality without significant mass effect. No hemorrhage identified. Background of advanced chronic microvascular ischemic changes and parenchymal volume loss of the brain. Chronic left occipital, and bilateral frontal small cortical infarctions. Vascular: Poorly visualized due to motion artifact. Skull and upper cervical spine: Normal marrow signal. Sinuses/Orbits: Negative. Other: None. IMPRESSION: 1. Extensive motion artifact. 2. Right PCA distribution acute infarction. Additional punctate acute infarcts within the right basal ganglia. No definite associated hemorrhage identified. 3. Background of advanced chronic microvascular ischemic changes, parenchymal volume loss, and small chronic cortical infarcts. These results will be called to the ordering clinician or representative by the Radiologist Assistant, and communication  documented in the PACS or zVision Dashboard. Electronically Signed   By: Kristine Garbe M.D.   On: 12/25/2016 22:28    Assessment: 83 y.o. female with acute onset of left sided weakness, left hemineglect and worsened confusion 1. MRI reveals a medium to large-sized right occipito-temporal ischemic infarction. 2. Intracranial atherosclerosis. MRA head reveals an abrupt proximal right P2 occlusion, compatible with acute right PCA territory infarct. Right PCA is of fetal type. On the left there is severe distal P2 stenosis, with probable distal left PCA occlusion, stable. There is severe stenosis/occlusion of the lateral branch of the right MCA, stable. Mild atheromatous irregularity within the proximal basilar artery without flow-limiting stenosis is also stable. 3. Stroke Risk Factors - Prior stroke, CAD, HTN, CHF and age 2. Elevated ammonia of 42. May be contributing to her AMS.   Plan: 1. Continue ASA, Plavix and atorvastatin.  2. Frequent neuro checks 3. PT consult, OT consult, Speech consult 4. Echocardiogram 5. Carotid dopplers 6. HgbA1c, fasting lipid panel  7. Risk factor modification 8. Telemetry monitoring 9. BP management. Out of permissive HTN time window.    '@Electronically'$  signed: Dr. Kerney Elbe  12/26/2016, 12:15 AM

## 2016-12-26 NOTE — Progress Notes (Signed)
*  PRELIMINARY RESULTS* Vascular Ultrasound Carotid Duplex (Doppler) has been completed.  Preliminary findings: Bilateral 1-39% ICA stenosis, antegrade vertebral flow.  Difficult exam due to tortuous and pulsatile vessels.   Kristine FischerCharlotte C Jearlene Mcclain 12/26/2016, 2:18 PM

## 2016-12-26 NOTE — Progress Notes (Signed)
  Echocardiogram 2D Echocardiogram has been performed.  [name] 12/26/2016, 1:24 PM

## 2016-12-26 NOTE — Evaluation (Signed)
SLP Cancellation Note  Patient Details Name: [name] MRN: [id number] DOB: 07-31-1933   Cancelled treatment:        Pt receiving bath by NT; will re-attempt eval at another time.    Donavan Burnetamara Adamarie Izzo, MS [facility] SLP [phone]

## 2016-12-26 NOTE — Evaluation (Signed)
Occupational Therapy Evaluation Patient Details Name: [name] MRN: [id number] DOB: 1932-11-08 Today's Date: 12/26/2016    History of Present Illness 83 y.o. female admitted with L sided neglect, fall. Imaging showed acute R occipital infarct. PMH of dementia, CHF, CAD, CVA.    Clinical Impression   PTA Pt min A for ADL and mod I for mobility with RW. Pt currently max A for ADL and Max A for stand pivot. Pt demonstrating right gaze preference, difficulty following commands, and left neglect severely impacting independence. Pt will benefit from skilled OT in the acute setting to maximize safety and independence in ADL. Pt will require SNF level therapy upon dc to continue to address new deficits and also provide caregiver education. Next session to focus on further visual assessment, and continue to address left neglect.     Follow Up Recommendations  SNF;Supervision/Assistance - 24 hour    Equipment Recommendations  Other (comment) (defer to next venue)    Recommendations for Other Services       Precautions / Restrictions Precautions Precautions: Fall Precaution Comments: pt reports several falls in the past 1 year, per chart she fell just prior to admission Restrictions Weight Bearing Restrictions: No      Mobility Bed Mobility Overal bed mobility: Needs Assistance Bed Mobility: Supine to Sit     Supine to sit: Max assist;HOB elevated     General bed mobility comments: mod A to advance BLEs to EOB, max A to raise trunk  Transfers Overall transfer level: Needs assistance Equipment used: Rolling walker (2 wheeled) Transfers: Sit to/from Stand Sit to Stand: Mod assist;From elevated surface         General transfer comment: mod A to rise    Balance Overall balance assessment: Needs assistance Sitting-balance support: Bilateral upper extremity supported;Feet supported Sitting balance-Leahy Scale: Fair Sitting balance - Comments: EOB with no back  support Postural control: Right lateral lean Standing balance support: Bilateral upper extremity supported Standing balance-Leahy Scale: Poor Standing balance comment: relies on BUE support                            ADL Overall ADL's : Needs assistance/impaired Eating/Feeding: Moderate assistance;Sitting Eating/Feeding Details (indicate cue type and reason): verbal cues for locating items on tray, trouble getting food on utensils/problem solving, assist needed to cut up food, did not engage Left hand in process at all Grooming: Minimal assistance;Sitting   Upper Body Bathing: Moderate assistance   Lower Body Bathing: Maximal assistance   Upper Body Dressing : Moderate assistance   Lower Body Dressing: Maximal assistance   Toilet Transfer: Maximal assistance;Stand-pivot;BSC Toilet Transfer Details (indicate cue type and reason): no RW used for transfer     Tub/ Shower Transfer: Maximal assistance   Functional mobility during ADLs:  (not attempted this session, stand pivot only) General ADL Comments: Pt very limited by left neglect, trouble following commands,      Vision Patient Visual Report: Other (comment) (difficult to assess due to neglect) Vision Assessment?: Yes Eye Alignment: Within Functional Limits Alignment/Gaze Preference: Gaze right;Head tilt Tracking/Visual Pursuits:  (Pt unable to follow commands for tracking) Visual Fields: Impaired-to be further tested in functional context Diplopia Assessment: Other (comment) (denies double vision) Depth Perception: Undershoots Additional Comments: Pt will respond by looking to right with auditory cues     Perception     Praxis      Pertinent Vitals/Pain Pain Assessment: No/denies pain  Hand Dominance Right   Extremity/Trunk Assessment Upper Extremity Assessment Upper Extremity Assessment: Generalized weakness;LUE deficits/detail LUE Deficits / Details: Pt reports decreased sensation LUE  Sensation: decreased light touch LUE Coordination: decreased gross motor   Lower Extremity Assessment Lower Extremity Assessment: Defer to PT evaluation   Cervical / Trunk Assessment Cervical / Trunk Assessment: Normal   Communication Communication Communication: No difficulties   Cognition Arousal/Alertness: Awake/alert Behavior During Therapy: WFL for tasks assessed/performed Overall Cognitive Status: No family/caregiver present to determine baseline cognitive functioning                 General Comments: noted h/o dementia, pt oriented to self, corrected stated month of birth, some difficulty following commands   General Comments       Exercises       Shoulder Instructions      Home Living Family/patient expects to be discharged to:: Skilled nursing facility                                        Prior Functioning/Environment Level of Independence: Needs assistance  Gait / Transfers Assistance Needed: rollator for mobility ADL's / Homemaking Assistance Needed: husband helps minimally with ADL            OT Problem List: Decreased strength;Decreased range of motion;Decreased activity tolerance;Impaired balance (sitting and/or standing);Impaired vision/perception;Decreased safety awareness;Impaired UE functional use      OT Treatment/Interventions: Self-care/ADL training;Neuromuscular education;Energy conservation;Therapeutic activities;Visual/perceptual remediation/compensation;Patient/family education;Balance training    OT Goals(Current goals can be found in the care plan section) Acute Rehab OT Goals Patient Stated Goal: to get home OT Goal Formulation: With patient Time For Goal Achievement: 01/09/17 Potential to Achieve Goals: Fair ADL Goals Pt Will Perform Upper Body Bathing: with supervision;sitting Pt Will Perform Lower Body Bathing: with min assist;with caregiver independent in assisting;sitting/lateral leans Pt Will Transfer to  Toilet: with min assist;bedside commode;stand pivot transfer Pt Will Perform Toileting - Clothing Manipulation and hygiene: with min guard assist;sit to/from stand Additional ADL Goal #1: In a field of 3-5 items, Pt will scan and find objects left of midline with less than 2 cues.  OT Frequency: Min 2X/week   Barriers to D/C:    no family present during session       Co-evaluation              End of Session Equipment Utilized During Treatment: Gait belt;Rolling walker Nurse Communication: Mobility status;Other (comment) (eating in chair with chair alarm)  Activity Tolerance: Patient tolerated treatment well Patient left: in chair;with chair alarm set;with call bell/phone within reach  OT Visit Diagnosis: Unsteadiness on feet (R26.81);Repeated falls (R29.6);Other symptoms and signs involving the nervous system (R29.898)                ADL either performed or assessed with clinical judgement  Time: [id number] OT Time Calculation (min): 53 min Charges:  OT General Charges $OT Visit: 1 Procedure OT Evaluation $OT Eval Moderate Complexity: 1 Procedure OT Treatments $Self Care/Home Management : 23-37 mins $Therapeutic Activity: 8-22 mins G-Codes:     Sherryl MangesLaura Hareem Surowiec OTR/L [phone] Evern BioLaura J Junella Domke 12/26/2016, 6:18 PM

## 2016-12-26 NOTE — Evaluation (Signed)
Physical Therapy Evaluation Patient Details Name: [name] MRN: [id number] DOB: 10/25/1932 Today's Date: 12/26/2016   History of Present Illness  83 y.o. female admitted with L sided neglect, fall. Imaging showed acute R occipital infarct. PMH of dementia, CHF, CAD, CVA.   Clinical Impression  Pt admitted with above diagnosis. Pt currently with functional limitations due to the deficits listed below (see PT Problem List).  Max assist supine to sit, mod A to stand and to take several pivotal steps with RW to recliner. Noted L sided neglect which limits pt's safety awareness.  Pt will benefit from skilled PT to increase their independence and safety with mobility to allow discharge to the venue listed below.       Follow Up Recommendations SNF    Equipment Recommendations  Wheelchair (measurements PT)    Recommendations for Other Services OT consult     Precautions / Restrictions Precautions Precautions: Fall Precaution Comments: pt reports several falls in the past 1 year, per chart she fell just prior to admission Restrictions Weight Bearing Restrictions: No      Mobility  Bed Mobility Overal bed mobility: Needs Assistance Bed Mobility: Supine to Sit     Supine to sit: Max assist;HOB elevated     General bed mobility comments: mod A to advance BLEs to EOB, max A to raise trunk  Transfers Overall transfer level: Needs assistance Equipment used: Rolling walker (2 wheeled) Transfers: Sit to/from Stand Sit to Stand: Mod assist;From elevated surface         General transfer comment: mod A to rise  Ambulation/Gait Ambulation/Gait assistance: Mod assist Ambulation Distance (Feet): 3 Feet Assistive device: Rolling walker (2 wheeled) Gait Pattern/deviations: Step-to pattern;Decreased weight shift to left   Gait velocity interpretation: Below normal speed for age/gender General Gait Details: Mod A to manage RW and facilitate weight shift to LLE, frequent  verbal/manual cues for technique, pt neglects L side   Stairs            Wheelchair Mobility    Modified Rankin (Stroke Patients Only)       Balance Overall balance assessment: Needs assistance   Sitting balance-Leahy Scale: Fair     Standing balance support: Bilateral upper extremity supported Standing balance-Leahy Scale: Poor Standing balance comment: relies on BUE support                             Pertinent Vitals/Pain Pain Assessment: No/denies pain    Home Living Family/patient expects to be discharged to:: Private residence Living Arrangements: Spouse/significant other Available Help at Discharge: Family;Available 24 hours/day Type of Home: House Home Access: Stairs to enter   Entergy CorporationEntrance Stairs-Number of Steps: 1 Home Layout: One level Home Equipment: Walker - 4 wheels;Bedside commode;Cane - single point Additional Comments: walks with cane or rollator in home    Prior Function Level of Independence: Needs assistance   Gait / Transfers Assistance Needed: rollator for mobility  ADL's / Homemaking Assistance Needed: husband helps minimally with ADL        Hand Dominance   Dominant Hand: Right    Extremity/Trunk Assessment   Upper Extremity Assessment Upper Extremity Assessment: Defer to OT evaluation    Lower Extremity Assessment Lower Extremity Assessment: LLE deficits/detail LLE Deficits / Details: L knee ext 3/5, difficulty following commands to perform manual muscle test, ankle DF -10* AROM, pt reports decreased sensation to light touch LLE LLE Sensation: decreased light touch  Cervical / Trunk Assessment Cervical / Trunk Assessment: Normal  Communication   Communication: No difficulties  Cognition Arousal/Alertness: Awake/alert Behavior During Therapy: WFL for tasks assessed/performed Overall Cognitive Status: No family/caregiver present to determine baseline cognitive functioning                 General  Comments: noted h/o dementia, pt oriented to self, corrected stated month and date of birthdate but stated she was born in 2011, some difficulty following commands    General Comments      Exercises     Assessment/Plan    PT Assessment Patient needs continued PT services  PT Problem List Decreased strength;Decreased activity tolerance;Decreased balance;Impaired sensation;Decreased knowledge of use of DME;Decreased mobility       PT Treatment Interventions DME instruction;Gait training;Functional mobility training;Balance training;Therapeutic exercise;Therapeutic activities;Patient/family education    PT Goals (Current goals can be found in the Care Plan section)  Acute Rehab PT Goals PT Goal Formulation: Patient unable to participate in goal setting Time For Goal Achievement: 01/09/17 Potential to Achieve Goals: Good    Frequency Min 3X/week   Barriers to discharge        Co-evaluation               End of Session Equipment Utilized During Treatment: Gait belt Activity Tolerance: Patient tolerated treatment well;Patient limited by fatigue Patient left: in chair;with call bell/phone within reach;with chair alarm set Nurse Communication: Mobility status PT Visit Diagnosis: Hemiplegia and hemiparesis;History of falling (Z91.81);Difficulty in walking, not elsewhere classified (R26.2) Hemiplegia - dominant/non-dominant: Non-dominant Hemiplegia - caused by: Cerebral infarction         Time: 1610-9604 PT Time Calculation (min) (ACUTE ONLY): 41 min   Charges:   PT Evaluation $PT Eval High Complexity: 1 Procedure PT Treatments $Therapeutic Activity: 23-37 mins   PT G Codes:         Tamala Ser 12/26/2016, 12:02 PM [phone]

## 2016-12-26 NOTE — H&P (Signed)
History and Physical    Kristine HymenJuanita B Sandberg ZOX:[id number] DOB: [birth year] DOA: 12/25/2016   PCP: Lolita PatellaEADE,ROBERT ALEXANDER, MD Chief Complaint:  Chief Complaint  Patient presents with  . Fall    HPI: [name] is a 83 y.o. female with medical history significant of dementia, prior stroke with no residual symptoms on aspirin and Plavix, hypertension, CAD, CHF on Lasix, and recent pneumonia who presents with a fall, confusion, left sided neglect, cough, and hypoxia. History was provided by both patient and her husband who was called. According to husband (who is legally blind), patient was last normal last night. He reports that this morning, patient spent several hours in the bathroom sitting. He says that when he checked on her, she was leaning to the right and turning towards the right constantly. He reports that this is new and different. He says that she had a unwitnessed fall but denied any head injury or headache. He reports that she has had a cough with some mucus production.  He reports that she does not use oxygen at baseline. She does report some generalized leg edema.  Patient denies any symptoms. She denies any headache, neck pain, chest pain, or abdominal pain. She denies any pain in her extremities. Denies any residual symptoms from her prior stroke.  ED Course: Patient with obvious L hemineglect, hypoxia improved by O2 via .  Stroke on MRI and bronchitis on CTA.  Review of Systems: As per HPI otherwise 10 point review of systems negative.    Past Medical History:  Diagnosis Date  . CHF (congestive heart failure) (HCC)   . Coronary artery disease   . Hypertension   . 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 No pertinent surgical history.   reports that she has quit smoking. She has never used smokeless tobacco. She reports that she does not drink alcohol or use drugs.  Allergies  Allergen Reactions  . Tetracyclines & Related Rash    Family History    Problem Relation Age of Onset  . Heart disease Mother       Prior to Admission medications   Medication Sig Start Date End Date Taking? Authorizing Provider  ALPRAZolam Prudy Feeler(XANAX) 0.25 MG tablet Take 1 tablet (0.25 mg total) by mouth 2 (two) times daily as needed for anxiety or sleep (May be used on call to MRI suite.). Patient not taking: Reported on 11/14/2016 06/17/16   Rodolph Bonganiel Thompson V, MD  aspirin 81 MG chewable tablet Chew 81 mg by mouth daily.      Historical Provider, MD  atorvastatin (LIPITOR) 20 MG tablet Take 20 mg by mouth every evening.    Historical Provider, MD  clopidogrel (PLAVIX) 75 MG tablet Take 75 mg by mouth daily.      Historical Provider, MD  cyanocobalamin (,VITAMIN B-12,) 1000 MCG/ML injection Inject 1 mL (1,000 mcg total) into the skin daily. Take 1000mcg SQ daily x 1 week, then 1000mcg sq weekly x 1 month, then 1000mcg  SQ monthly. Patient not taking: Reported on 11/14/2016 06/17/16   Rodolph Bonganiel Thompson V, MD  furosemide (LASIX) 40 MG tablet Take 1 tablet (40 mg total) by mouth daily. Patient not taking: Reported on 11/14/2016 06/17/16   Rodolph Bonganiel Thompson V, MD  losartan (COZAAR) 50 MG tablet Take 50 mg by mouth daily.      Historical Provider, MD  metoprolol tartrate (LOPRESSOR) 25 MG tablet Take 25 mg by mouth 2 (two) times daily.      Historical Provider, MD  Nutritional Supplements (NUTRITIONAL SUPPLEMENT PO) Take 120 mLs by mouth 2 (two) times daily.    Historical Provider, MD  QUEtiapine (SEROQUEL) 25 MG tablet Take 1 tablet (25 mg total) by mouth at bedtime. Patient not taking: Reported on 11/14/2016 06/17/16   Rodolph Bong, MD    Physical Exam: Vitals:   12/25/16 2030 12/25/16 2300 12/25/16 2315 12/25/16 2330  BP: 154/92 (!) 129/54 135/62 124/57  Pulse: 107 83 63   Resp: 16     Temp:      TempSrc:      SpO2: 100% 91% 100%       Constitutional: NAD, calm, comfortable Eyes: PERRL, lids and conjunctivae normal ENMT: Mucous membranes are moist. Posterior  pharynx clear of any exudate or lesions.Normal dentition.  Neck: normal, supple, no masses, no thyromegaly Respiratory: clear to auscultation bilaterally, no wheezing, no crackles. Normal respiratory effort. No accessory muscle use.  Cardiovascular: Regular rate and rhythm, no murmurs / rubs / gallops. No extremity edema. 2+ pedal pulses. No carotid bruits.  Abdomen: no tenderness, no masses palpated. No hepatosplenomegaly. Bowel sounds positive.  Musculoskeletal: no clubbing / cyanosis. No joint deformity upper and lower extremities. Good ROM, no contractures. Normal muscle tone.  Skin: no rashes, lesions, ulcers. No induration Neurologic: dense left hemineglect, likely left homonymous hemianopsia as well. Psychiatric: Normal judgment and insight. Alert and oriented x 3. Normal mood.    Labs on Admission: I have personally reviewed following labs and imaging studies  CBC:  Recent Labs Lab 12/25/16 1655  WBC 4.7  NEUTROABS 3.7  HGB 10.7*  HCT 34.2*  MCV 89.3  PLT 268   Basic Metabolic Panel:  Recent Labs Lab 12/25/16 1655  NA 141  K 3.8  CL 106  CO2 26  GLUCOSE 139*  BUN 17  CREATININE 0.90  CALCIUM 8.9   GFR: CrCl cannot be calculated (Unknown ideal weight.). Liver Function Tests:  Recent Labs Lab 12/25/16 1655  AST 17  ALT 20  ALKPHOS 86  BILITOT 0.6  PROT 6.8  ALBUMIN 3.5   No results for input(s): LIPASE, AMYLASE in the last 168 hours.  Recent Labs Lab 12/25/16 1655  AMMONIA 42*   Coagulation Profile:  Recent Labs Lab 12/25/16 1655  INR 1.00   Cardiac Enzymes: No results for input(s): CKTOTAL, CKMB, CKMBINDEX, TROPONINI in the last 168 hours. BNP (last 3 results) No results for input(s): PROBNP in the last 8760 hours. HbA1C: No results for input(s): HGBA1C in the last 72 hours. CBG: No results for input(s): GLUCAP in the last 168 hours. Lipid Profile: No results for input(s): CHOL, HDL, LDLCALC, TRIG, CHOLHDL, LDLDIRECT in the last 72  hours. Thyroid Function Tests: No results for input(s): TSH, T4TOTAL, FREET4, T3FREE, THYROIDAB in the last 72 hours. Anemia Panel: No results for input(s): VITAMINB12, FOLATE, FERRITIN, TIBC, IRON, RETICCTPCT in the last 72 hours. Urine analysis:    Component Value Date/Time   COLORURINE YELLOW 12/25/2016 1714   APPEARANCEUR HAZY (A) 12/25/2016 1714   LABSPEC 1.018 12/25/2016 1714   PHURINE 6.0 12/25/2016 1714   GLUCOSEU NEGATIVE 12/25/2016 1714   HGBUR NEGATIVE 12/25/2016 1714   BILIRUBINUR NEGATIVE 12/25/2016 1714   KETONESUR NEGATIVE 12/25/2016 1714   PROTEINUR NEGATIVE 12/25/2016 1714   UROBILINOGEN 1.0 09/26/2011 1954   NITRITE NEGATIVE 12/25/2016 1714   LEUKOCYTESUR NEGATIVE 12/25/2016 1714   Sepsis Labs: @LABRCNTIP (procalcitonin:4,lacticidven:4) )No results found for this or any previous visit (from the past 240 hour(s)).   Radiological Exams on Admission: Dg Chest  2 View  Result Date: 12/25/2016 CLINICAL DATA:  Left-sided neglect recent pneumonia with persistent cough EXAM: CHEST  2 VIEW COMPARISON:  11/14/2016, 06/15/2016 FINDINGS: Cardiomegaly with central vascular congestion. There are tiny bilateral effusions. There is a mild opacity at the lingula. No pneumothorax. Degenerative changes of the spine. Atherosclerotic calcifications of the aorta. IMPRESSION: 1. Cardiomegaly with central vascular congestion 2. Tiny bilateral effusions. Mild lingular opacity may reflect atelectasis or a small infiltrate. Electronically Signed   By: Jasmine Pang M.D.   On: 12/25/2016 17:39   Ct Head Wo Contrast  Result Date: 12/25/2016 CLINICAL DATA:  Left-sided neglect, extraocular muscles not going to the left EXAM: CT HEAD WITHOUT CONTRAST CT CERVICAL SPINE WITHOUT CONTRAST TECHNIQUE: Multidetector CT imaging of the head and cervical spine was performed following the standard protocol without intravenous contrast. Multiplanar CT image reconstructions of the cervical spine were also  generated. COMPARISON:  MRI 06/15/2016, CT brain 06/14/2016 FINDINGS: CT HEAD FINDINGS Brain: Old appearing infarcts/encephalomalacia within the bilateral frontal lobes. Old left occipital lobe infarct. Old right posterior parietal infarct. Low-attenuation with suspected loss of gray-white matter differentiation in the right occipital lobe, series 6, image number 33. No hemorrhage. No focal mass. No midline shift. Moderate atrophy. Moderate-to-marked periventricular, subcortical and deep white matter small vessel ischemic changes. Tiny old appearing lacunar infarct left thalamus. Vascular: No hyperdense vessels.  Carotid artery calcifications. Skull: No fracture.  No suspicious bone lesion. Sinuses/Orbits: Mucosal thickening in the sphenoid and ethmoid sinuses. No acute orbital abnormality. Bilateral lens extraction. Other: None CT CERVICAL SPINE FINDINGS Alignment: Straightening of the cervical spine. No subluxation. Facet alignment within normal limits. Skull base and vertebrae: Craniovertebral junction appears intact. The vertebral body heights are normal. There is no fracture. Soft tissues and spinal canal: No prevertebral fluid or swelling. No visible canal hematoma. Disc levels: Multilevel degenerate disc changes. This is moderate and most notable at C5-C6, C6-C7 and C7-T1. Multilevel bilateral facet arthropathy. Upper chest: Biapical lung scarring. Tiny hypodense thyroid nodules. Carotid artery calcifications. Other: None IMPRESSION: 1. Suspected area of decreased attenuation/possible infarct in the right occipital lobe. No hemorrhage. Further evaluation with MRI is recommended. 2. Old appearing multifocal infarcts as described above. 3. Moderate-to-marked periventricular, subcortical and deep white matter small vessel ischemic changes 4. Straightening of the cervical spine. No acute fracture or malalignment. Electronically Signed   By: Jasmine Pang M.D.   On: 12/25/2016 19:56   Ct Angio Chest Pe W And/or  Wo Contrast  Result Date: 12/25/2016 CLINICAL DATA:  83 year old presenting with acute onset of cough, showed above evaluation to have hypoxia, tachycardia head elevated D-dimer. Former smoker. Current history of hypertension and coronary artery disease. EXAM: CT ANGIOGRAPHY CHEST WITH CONTRAST TECHNIQUE: Multidetector CT imaging of the chest was performed using the standard protocol during bolus administration of intravenous contrast. Multiplanar CT image reconstructions and MIPs were obtained to evaluate the vascular anatomy. CONTRAST:  150 mL Isovue 370 IV. COMPARISON:  None. FINDINGS: Cardiovascular: Contrast opacification of the pulmonary arteries is excellent. Respiratory motion blurs images throughout the examination. The study is of good diagnostic quality. No filling defects within either main pulmonary artery or their branches in either lung to suggest pulmonary embolism. Dilated right main pulmonary artery measuring up to 3.5 cm diameter. Mildly dilated left main pulmonary artery measuring up to 3.1 cm diameter. Heart markedly enlarged with left ventricular predominance and mild left ventricular hypertrophy. No pericardial effusion. Extensive LAD and mild right coronary artery atherosclerosis. The thoracic and upper abdominal  aorta is not opacified. Moderate atherosclerosis without evidence of aneurysm. Mediastinum/Nodes: Moderate to large hiatal hernia. No pathologic lymphadenopathy. Visualized thyroid gland unremarkable. Lungs/Pleura: Scattered areas of hyperlucency throughout both lungs with other areas of relative ground-glass airspace opacity. Low lung volumes with atelectasis in the lower lobes. No confluent airspace consolidation. No pulmonary parenchymal nodules or masses. Central airways patent with marked central peribronchial thickening. Bronchiectasis in the left upper lobe. No pleural effusions. Upper Abdomen: Diverticulosis involving the visualized splenic flexure the colon. Hiatal hernia  as mentioned above. Otherwise unremarkable. Musculoskeletal: DISH involving the lower thoracic spine. Mild osseous demineralization. Review of the MIP images confirms the above findings. IMPRESSION: 1. No evidence of pulmonary embolism. 2. Enlarged main pulmonary arteries bilaterally indicating longstanding pulmonary arterial hypertension. 3. Marked cardiomegaly. LAD and right coronary artery atherosclerosis. 4. Findings consistent with severe asthma and/or bronchitis. No confluent airspace pneumonia. 5. Moderate to large hiatal hernia. Electronically Signed   By: Hulan Saas M.D.   On: 12/25/2016 21:50   Ct Cervical Spine Wo Contrast  Result Date: 12/25/2016 CLINICAL DATA:  Left-sided neglect, extraocular muscles not going to the left EXAM: CT HEAD WITHOUT CONTRAST CT CERVICAL SPINE WITHOUT CONTRAST TECHNIQUE: Multidetector CT imaging of the head and cervical spine was performed following the standard protocol without intravenous contrast. Multiplanar CT image reconstructions of the cervical spine were also generated. COMPARISON:  MRI 06/15/2016, CT brain 06/14/2016 FINDINGS: CT HEAD FINDINGS Brain: Old appearing infarcts/encephalomalacia within the bilateral frontal lobes. Old left occipital lobe infarct. Old right posterior parietal infarct. Low-attenuation with suspected loss of gray-white matter differentiation in the right occipital lobe, series 6, image number 33. No hemorrhage. No focal mass. No midline shift. Moderate atrophy. Moderate-to-marked periventricular, subcortical and deep white matter small vessel ischemic changes. Tiny old appearing lacunar infarct left thalamus. Vascular: No hyperdense vessels.  Carotid artery calcifications. Skull: No fracture.  No suspicious bone lesion. Sinuses/Orbits: Mucosal thickening in the sphenoid and ethmoid sinuses. No acute orbital abnormality. Bilateral lens extraction. Other: None CT CERVICAL SPINE FINDINGS Alignment: Straightening of the cervical spine.  No subluxation. Facet alignment within normal limits. Skull base and vertebrae: Craniovertebral junction appears intact. The vertebral body heights are normal. There is no fracture. Soft tissues and spinal canal: No prevertebral fluid or swelling. No visible canal hematoma. Disc levels: Multilevel degenerate disc changes. This is moderate and most notable at C5-C6, C6-C7 and C7-T1. Multilevel bilateral facet arthropathy. Upper chest: Biapical lung scarring. Tiny hypodense thyroid nodules. Carotid artery calcifications. Other: None IMPRESSION: 1. Suspected area of decreased attenuation/possible infarct in the right occipital lobe. No hemorrhage. Further evaluation with MRI is recommended. 2. Old appearing multifocal infarcts as described above. 3. Moderate-to-marked periventricular, subcortical and deep white matter small vessel ischemic changes 4. Straightening of the cervical spine. No acute fracture or malalignment. Electronically Signed   By: Jasmine Pang M.D.   On: 12/25/2016 19:56   Mr Brain Wo Contrast  Result Date: 12/25/2016 CLINICAL DATA:  83 y/o  F; left-sided neglect. EXAM: MRI HEAD WITHOUT CONTRAST TECHNIQUE: Multiplanar, multiecho pulse sequences of the brain and surrounding structures were obtained without intravenous contrast. COMPARISON:  12/25/2016 CT of the head.  06/15/2016 MRI of the head. FINDINGS: Brain: Right posteromedial temporal, occipital, and hippocampus diffusion restriction compatible with acute right PCA territory infarction. There additional punctate foci of diffusion restriction within the right caudate head, right thalamus, and right globus pallidus also likely representing acute ischemia. The region of infarction demonstrates mild T2 FLAIR hyperintense signal abnormality without  significant mass effect. No hemorrhage identified. Background of advanced chronic microvascular ischemic changes and parenchymal volume loss of the brain. Chronic left occipital, and bilateral frontal  small cortical infarctions. Vascular: Poorly visualized due to motion artifact. Skull and upper cervical spine: Normal marrow signal. Sinuses/Orbits: Negative. Other: None. IMPRESSION: 1. Extensive motion artifact. 2. Right PCA distribution acute infarction. Additional punctate acute infarcts within the right basal ganglia. No definite associated hemorrhage identified. 3. Background of advanced chronic microvascular ischemic changes, parenchymal volume loss, and small chronic cortical infarcts. These results will be called to the ordering clinician or representative by the Radiologist Assistant, and communication documented in the PACS or zVision Dashboard. Electronically Signed   By: Mitzi Hansen M.D.   On: 12/25/2016 22:28    EKG: Independently reviewed.  Assessment/Plan Principal Problem:   Acute ischemic stroke [employer]) Active Problems:   Acute bronchitis    1. Acute ischemic stroke - 1. Stroke pathway 2. Neuro consult 3. PT/OT/SLP 4. Resume ASA and plavix 5. Permissive HTN 2. Acute bronchitis - 1. Azithromycin 2. Adult wheeze protocol 3. O2 as needed 4. Will hold off on steroids for the moment.   DVT prophylaxis: Lovenox Code Status: Full Family Communication: No family in room Consults called: Neuro Admission status: Admit to inpatient   Hillary Bow DO [facility] Pager [phone] from 7PM-7AM  If 7AM-7PM, please contact the day physician for the patient [email or web address] Password [email or web address]  12/26/2016, 12:04 AM

## 2016-12-26 NOTE — Progress Notes (Signed)
Patient seen and examined at bedside, patient admitted after midnight, please see earlier detailed admission note by Dr. Julian ReilGardner. Briefly, patient presented with stroke. Patient is steadily declining in function with a huge decline with this present stroke. Stroke team on board. Palliative medicine consulted.   Kristine Hawkingalph Jaydon Soroka, MD [facility] 12/26/2016, 6:45 PM Pager: [phone]

## 2016-12-26 NOTE — Progress Notes (Signed)
STROKE TEAM PROGRESS NOTE   SUBJECTIVE (INTERVAL HISTORY) Her RN is at the bedside.  She is lying in bed   with head and neck deviated to right OBJECTIVE Temp:  [97.9 F (36.6 C)-98.8 F (37.1 C)] 98.8 F (37.1 C) (03/09 0530) Pulse Rate:  [63-107] 73 (03/09 0530) Cardiac Rhythm: Normal sinus rhythm (03/09 0123) Resp:  [13-18] 16 (03/09 0530) BP: (124-154)/(53-92) 140/66 (03/09 0530) SpO2:  [84 %-100 %] 100 % (03/09 0530)  CBC:  Recent Labs Lab 12/25/16 1655  WBC 4.7  NEUTROABS 3.7  HGB 10.7*  HCT 34.2*  MCV 89.3  PLT 268    Basic Metabolic Panel:  Recent Labs Lab 12/25/16 1655  NA 141  K 3.8  CL 106  CO2 26  GLUCOSE 139*  BUN 17  CREATININE 0.90  CALCIUM 8.9      PHYSICAL EXAM Pleasant elderly Caucasian lady currently not in distress. . Afebrile. Head is nontraumatic. Neck is supple without bruit.    Cardiac exam no murmur or gallop. Lungs are clear to auscultation. Distal pulses are well felt.  Neurological Exam ;   Awake alert disoriented. No aphasia or dysarthria. Right gaze preference. Unable to look to the left past midline. Dense left, numerous hemianopsia. Follows simple midline and one-step commands. Fundi were not visualized. Mild left lower facial weakness. Moves right side purposefully against gravity without weakness. Left hemi-neglect. Moves left side against gravity but not cooperative for detailed exam. Tone is diminished on the left compared to the right. Deep tendon reflexes are symmetric. Plantars are downgoing. Gait was not tested.    ASSESSMENT/PLAN [name] is a 83 y.o. female with history of previous stroke, hypertension, coronary artery disease, and congestive heart failure presenting after a fall, associated with confusion, and left hemi-neglect. She did not receive IV t-PA due to late presentation.  Stroke:  Right PCA distribution acute infarction. Additional punctate acute infarcts within the right basal ganglia.  Embolic.  Resultant  confusion, left hemianopsia and left hemi-neglect and mild hemiparesis  MRI - Right PCA distribution acute infarction. Additional punctate acute infarcts within the right basal ganglia. No definite associated hemorrhage identified. Background of advanced chronic microvascular ischemic changes, parenchymal volume loss, and small chronic cortical infarcts.   MRA - Abrupt proximal right P2 occlusion, compatible with acute right PCA territory infarct. Right PCA is of fetal type. Severe distal left P2 stenosis, with probable distal left PCA occlusion, stable. Severe stenosis/occlusion of the lateral branch of the right MCA, stable. Mild atheromatous irregularity within the proximal basilar artery without flow-limiting stenosis, also stable.   CT C spine -  Straightening of the cervical spine. No acute fracture or malalignment.   Portable CXR - Cardiomegaly with central vascular congestion. Tiny bilateral effusions. Mild lingular opacity may reflect atelectasis or a small infiltrate.   CTA chest -  No evidence of pulmonary embolism. Enlarged main pulmonary arteries bilaterally indicating longstanding pulmonary arterial hypertension. Marked cardiomegaly. LAD and right coronary artery atherosclerosis. Findings consistent with severe asthma and/or bronchitis. No confluent airspace pneumonia. Moderate to large hiatal hernia.   Carotid Doppler - no significant stenosis  2D Echo - EF 55-60%. No cardiac source of emboli identified.  LDL - 101  HgbA1c pending  VTE prophylaxis - Lovenox  DIET SOFT Room service appropriate? Yes; Fluid consistency: Thin  clopidogrel 75 mg daily prior to admission, now on clopidogrel 75 mg daily  Therapy recommendations:  SNF recommended  Disposition: Pending  Hypertension  Stable  Permissive  hypertension (OK if < 220/120) but gradually normalize in 5-7 days  Long-term BP goal normotensive  Hyperlipidemia  Home meds:  Lipitor 20 mg daily  resumed in hospital  LDL 101 goal < 70  Increase Lipitor to 40 mg daily  Continue statin at discharge  Diabetes  HgbA1c pending goal < 7.0  Unc / Controlled  Other Stroke Risk Factors  Advanced age  Former smoker - quit in the past  Mild Obesity, There is no height or weight on file to calculate BMI.  Hx stroke/TIA   Coronary artery disease    Other Active Problems  Anemia - 10.7 ; 34.2  Hospital day # 1  I have personally examined this patient, reviewed notes, independently viewed imaging studies, participated in medical decision making and plan of care.ROS completed by me personally and pertinent positives fully documented  I have made any additions or clarifications directly to the above note. Agree with note above. She has presented with subacute confusion and left hemi-neglect and visual field loss secondary to embolic right posterior cerebral artery and basal ganglia infarct. Continue ongoing stroke workup. Continue  Plavix for stroke prevention. No family available at bedside for discussion. Greater than 50% time during this 35 minute visit was spent on coordination of care about her stroke evaluation, treatment and discussion with Dr. Rueben Bash, MD Medical Director [facility] Pager: [phone] 12/26/2016 4:39 PM   To contact Stroke Continuity provider, please refer to [email or web address]. After hours, contact General Neurology

## 2016-12-26 NOTE — Clinical Social Work Note (Signed)
CSW informed by nurse case manager that PT recommended SNF. Assessment will be completed with family and facility search initiated.   [name], MSW, LCSW Licensed Clinical Social Worker Clinical Social Work Department [facility] [phone]

## 2016-12-27 ENCOUNTER — Inpatient Hospital Stay (HOSPITAL_COMMUNITY): Payer: Medicare Other

## 2016-12-27 DIAGNOSIS — I639 Cerebral infarction, unspecified: Secondary | ICD-10-CM

## 2016-12-27 DIAGNOSIS — J209 Acute bronchitis, unspecified: Secondary | ICD-10-CM

## 2016-12-27 DIAGNOSIS — I10 Essential (primary) hypertension: Secondary | ICD-10-CM

## 2016-12-27 DIAGNOSIS — I6340 Cerebral infarction due to embolism of unspecified cerebral artery: Secondary | ICD-10-CM

## 2016-12-27 LAB — HEMOGLOBIN A1C
Hgb A1c MFr Bld: 6.3 % — ABNORMAL HIGH (ref 4.8–5.6)
MEAN PLASMA GLUCOSE: 134 mg/dL

## 2016-12-27 MED ORDER — ATORVASTATIN CALCIUM 80 MG PO TABS
80.0000 mg | ORAL_TABLET | Freq: Every evening | ORAL | Status: DC
  Administered 2016-12-27 – 2016-12-30 (×4): 80 mg via ORAL
  Filled 2016-12-27 (×4): qty 1

## 2016-12-27 NOTE — Progress Notes (Signed)
STROKE TEAM PROGRESS NOTE   HISTORY OF PRESENT ILLNESS (per record) [name] is an 83 y.o. female who presented on Thursday in the late afternoon after a fall at home onto a carpet. She did not hit her head. Per her husband, she has been more confused recently and "not acting like herself". Per husband, LKN with regards to her acute neurological change was Wednesday night. On Thursday AM, she spent several hours sitting in the bathroom. When her husband checked on her, she was leaning to the right and persistently turning towards the right. On initial ED assessment she was confused with left sided neglect. Cough and hypoxia were also noted. Her hypoxia improved with O2 via nasal cannula.   CT head revealed an area of decreased attenuation/possible infarct in the right occipital lobe with no hemorrhage. Also noted were old appearing multifocal infarcts, moderate-to-marked periventricular, subcortical and deep white matter small vessel ischemic changes and atrophy.   MRI was then obtained, revealing a right PCA distribution subacute infarction. Additional punctate acute infarcts were also seen within the right basal ganglia. No definite associated hemorrhage identified. These findings were seen on a background of advanced chronic microvascular ischemic changes, parenchymal volume loss, and small chronic cortical infarcts.   Her PMHx includes dementia, CAD, HTN, CHF, recent pneumonia and stroke without residual deficit.   LSN: Tuesday night tPA Given: No: Out of time window.    SUBJECTIVE (INTERVAL HISTORY) No family is at the bedside. She is lying in bed with left hemianopia. She stated that she lives with her husband at home, taking care of herself without assistance. She heard about the "dementia" but not sure if she has dementia.    OBJECTIVE Temp:  [97.7 F (36.5 C)-98.3 F (36.8 C)] 98.2 F (36.8 C) (03/10 1401) Pulse Rate:  [49-85] 77 (03/10 1401) Cardiac Rhythm: Sinus  tachycardia (03/10 1509) Resp:  [18] 18 (03/10 1401) BP: (146-167)/(51-74) 164/67 (03/10 1401) SpO2:  [96 %-100 %] 100 % (03/10 1401)  CBC:   Recent Labs Lab 12/25/16 1655  WBC 4.7  NEUTROABS 3.7  HGB 10.7*  HCT 34.2*  MCV 89.3  PLT 268    Basic Metabolic Panel:   Recent Labs Lab 12/25/16 1655  NA 141  K 3.8  CL 106  CO2 26  GLUCOSE 139*  BUN 17  CREATININE 0.90  CALCIUM 8.9    Lipid Panel:     Component Value Date/Time   CHOL 161 12/26/2016 0638   TRIG 77 12/26/2016 0638   HDL 45 12/26/2016 0638   CHOLHDL 3.6 12/26/2016 0638   VLDL 15 12/26/2016 0638   LDLCALC 101 (H) 12/26/2016 0638   HgbA1c:  Lab Results  Component Value Date   HGBA1C 6.3 (H) 12/26/2016   Urine Drug Screen:     Component Value Date/Time   LABOPIA NONE DETECTED 11/14/2016 1914   COCAINSCRNUR NONE DETECTED 11/14/2016 1914   LABBENZ NONE DETECTED 11/14/2016 1914   AMPHETMU NONE DETECTED 11/14/2016 1914   THCU NONE DETECTED 11/14/2016 1914   LABBARB NONE DETECTED 11/14/2016 1914      IMAGING I have personally reviewed the radiological images below and agree with the radiology interpretations.  Dg Chest 2 View 12/25/2016 1. Cardiomegaly with central vascular congestion  2. Tiny bilateral effusions. Mild lingular opacity may reflect atelectasis or a small infiltrate.   Ct Head Wo Contrast 12/25/2016 1. Suspected area of decreased attenuation/possible infarct in the right occipital lobe. No hemorrhage. Further evaluation with MRI is recommended.  2. Old appearing multifocal infarcts as described above.  3. Moderate-to-marked periventricular, subcortical and deep white matter small vessel ischemic changes   Ct Angio Chest Pe W And/or Wo Contrast 12/25/2016 1. No evidence of pulmonary embolism.  2. Enlarged main pulmonary arteries bilaterally indicating longstanding pulmonary arterial hypertension.  3. Marked cardiomegaly. LAD and right coronary artery atherosclerosis.  4. Findings  consistent with severe asthma and/or bronchitis. No confluent airspace pneumonia.  5. Moderate to large hiatal hernia.   Ct Cervical Spine Wo Contrast 12/25/2016 Straightening of the cervical spine. No acute fracture or malalignment.   Mr Brain Wo Contrast 12/25/2016 1. Extensive motion artifact.  2. Right PCA distribution acute infarction. Additional punctate acute infarcts within the right basal ganglia. No definite associated hemorrhage identified.  3. Background of advanced chronic microvascular ischemic changes, parenchymal volume loss, and small chronic cortical infarcts.   Mr Maxine Glenn Head/brain Wo Cm 12/26/2016 1. Abrupt proximal right P2 occlusion, compatible with acute right PCA territory infarct. Right PCA is of fetal type.  2. Severe distal left P2 stenosis, with probable distal left PCA occlusion, stable.  3. Severe stenosis/occlusion of the lateral branch of the right MCA, stable.  4. Mild atheromatous irregularity within the proximal basilar artery without flow-limiting stenosis, also stable.   LE venous doppler - There is no obvious evidence of DVT or SVT noted in the visualized veins of the bilateral lower extremities  CUS - Bilateral 1-39% ICA stenosis, antegrade vertebral flow.  Difficult exam due to tortuous and pulsatile vessels.  TTE - Left ventricle: The cavity size was normal. There was mild   concentric hypertrophy. Systolic function was normal. The   estimated ejection fraction was in the range of 55% to 60%. Wall   motion was normal; there were no regional wall motion   abnormalities. Doppler parameters are consistent with abnormal   left ventricular relaxation (grade 1 diastolic dysfunction).   Doppler parameters are consistent with high ventricular filling   pressure. - Aortic valve: Transvalvular velocity was within the normal range.   There was no stenosis. There was no regurgitation. - Mitral valve: Transvalvular velocity was within the normal range.   There  was no evidence for stenosis. There was mild regurgitation. - Left atrium: The atrium was severely dilated. - Right ventricle: The cavity size was normal. Wall thickness was   normal. Systolic function was normal. - Tricuspid valve: There was mild regurgitation. - Pulmonary arteries: Systolic pressure was moderately increased.   PA peak pressure: 49 mm Hg (S).   PHYSICAL EXAM Physical exam  Temp:  [97.7 F (36.5 C)-98.3 F (36.8 C)] 98.2 F (36.8 C) (03/10 1401) Pulse Rate:  [49-85] 77 (03/10 1401) Resp:  [18] 18 (03/10 1401) BP: (146-167)/(51-74) 164/67 (03/10 1401) SpO2:  [96 %-100 %] 100 % (03/10 1401)  General - Well nourished, well developed, in no apparent distress.  Ophthalmologic - Fundi not visualized due to eye movement.  Cardiovascular - Regular rate and rhythm.  Mental Status -  Level of arousal and orientation to month, place, and person were intact, but not to year. Language including expression, naming, repetition, comprehension was assessed and found intact.  Cranial Nerves II - XII - II - left homonymous hemianopia. III, IV, VI - Extraocular movements intact. V - Facial sensation intact bilaterally. VII - Facial movement intact bilaterally. VIII - Hearing & vestibular intact bilaterally. X - Palate elevates symmetrically. XI - Chin turning & shoulder shrug intact bilaterally. XII - Tongue protrusion intact.  Motor  Strength - The patient's strength was normal in all extremities and pronator drift was absent.  Bulk was normal and fasciculations were absent.   Motor Tone - Muscle tone was assessed at the neck and appendages and was normal.  Reflexes - The patient's reflexes were 1+ in all extremities and she had no pathological reflexes.  Sensory - Light touch, temperature/pinprick, vibration were assessed and were symmetrical.    Coordination - The patient had normal movements in the hands and feet with no ataxia or dysmetria.  Tremor was absent.  Gait  and Station - deferred.   ASSESSMENT/PLAN [name] is a 83 y.o. female with history of a previous stroke, recent pneumonia, hypertension, coronary artery disease, and congestive heart failure presenting with confusion, left-sided neglect, cough, and hypoxia. She did not receive IV t-PA due to late presentation.  Strokes:  Right PCA distribution and right BG infarcts felt to be embolic from an unknown source.  Resultant  Left hemianopia  MRI - Right PCA distribution acute infarction. Additional punctate acute infarcts within the right basal ganglia.  MRA - right PCA occlusion, stable left PCA distal stenosis and b/l MCA branches asthero  Carotid Doppler - no significant stenosis  2D Echo - EF 55-60%. No cardiac source of emboli identified.  LE venous doppler no DVT  Recommend TEE and loop recorder to evaluate cardiac source of stroke  LDL - 101  HgbA1c - 6.3  VTE prophylaxis - Lovenox DIET SOFT Room service appropriate? Yes; Fluid consistency: Thin Diet NPO time specified Except for: Sips with Meds  clopidogrel 75 mg daily prior to admission, now on aspirin 81 mg daily and clopidogrel 75 mg daily  Patient counseled to be compliant with her antithrombotic medications  Ongoing aggressive stroke risk factor management  Therapy recommendations:  SNF recommended  Disposition: - Pending  Hx of stroke  10/2005 MRI - old left MCA/PCA infarct  12/2006 MRI - old b/l MCA/PCA infarct  05/2016 MRI new old left MCA / frontal infarct - MRA left PCA distal stenosis and b/l MCA branch asthero  12/2016 MRI - right PCA infarct, right BG punctate infarct  Hypertension  Stable  Permissive hypertension (OK if < 220/120) but gradually normalize in 5-7 days  Long-term BP goal normotensive  Hyperlipidemia  Home meds:  Lipitor 40 mg daily resumed in hospital  LDL 101, goal < 70  Increase Lipitor to 80 mg daily  Continue statin at discharge  Pre-Diabetes  HgbA1c 6.3,  goal < 7.0  Controlled  Other Stroke Risk Factors  Advanced age  Former smoker  Coronary artery disease  Other Active Problems  Anemia - 10.7 / 34.2  Hospital day # 2  Marvel Plan, MD PhD Stroke Neurology 12/27/2016 3:33 PM   To contact Stroke Continuity provider, please refer to [email or web address]. After hours, contact General Neurology

## 2016-12-27 NOTE — Progress Notes (Signed)
CCMD called RN to notify her of pt having 7beats run of PVC's. Dr. Caleb PoppNettey on the unit notified. No new orders received. Will continue to monitor pt. Kristine BucyP. Amo Jaima Janney RN

## 2016-12-27 NOTE — Progress Notes (Signed)
VASCULAR LAB PRELIMINARY  PRELIMINARY  PRELIMINARY  PRELIMINARY  Bilateral lower extremity venous duplex completed.    Preliminary report:  There is no obvious evidence of DVT or SVT noted in the visualized veins of the bilateral lower extremities.   Sabre Leonetti, RVT 12/27/2016, 12:05 PM

## 2016-12-27 NOTE — Progress Notes (Signed)
PROGRESS NOTE    [name]  WUJ:[id number] DOB: September 13, 1933 DOA: 12/25/2016 PCP: Lolita PatellaEADE,ROBERT ALEXANDER, MD   Brief Narrative: [name] is a 83 y.o. female with medical history significant of dementia, prior stroke with no residual symptoms on aspirin and Plavix, hypertension, CAD, CHF on Lasix. She presented with a new stroke.   Assessment & Plan:   Principal Problem:   Acute ischemic stroke [facility](HCC) Active Problems:   Acute bronchitis   Acute ischemic stroke MRI significant for right PCA distribution infarct with additional punctate infarcts within right basal ganglia. Echo significant for EF of 55-60% with grade 1 diastolic dysfunction.  -Neuro recommendations: loop recorder -aspirin 81mg  -Plavix 75mg  -atorvastatin 80mg   Acute bronchitis Appears asymptomatic -continue azithromycin   DVT prophylaxis: Lovenox Code Status: Full code Family Communication: None at bedside Disposition Plan: Discharge pending stroke workup   Consultants:   Neurology  Procedures:   Echocardiogram (12/26/2016)  Antimicrobials:   Ceftriaxone  Azithromycin    Subjective: Patient reports being tired. Otherwise, no chest pain, dyspnea or abdominal pain.  Objective: Vitals:   12/27/16 0029 12/27/16 0451 12/27/16 1038 12/27/16 1401  BP: (!) 149/74 (!) 167/71 (!) 150/60 (!) 164/67  Pulse: 79 (!) 51 (!) 49 77  Resp: 18 18 18 18   Temp: 97.7 F (36.5 C) 98.3 F (36.8 C) 97.8 F (36.6 C) 98.2 F (36.8 C)  TempSrc: Oral Oral Axillary Oral  SpO2: 97% 98% 100% 100%    Intake/Output Summary (Last 24 hours) at 12/27/16 1422 Last data filed at 12/27/16 0616  Gross per 24 hour  Intake              250 ml  Output                0 ml  Net              250 ml   There were no vitals filed for this visit.  Examination:  General exam: Appears calm and comfortable  Respiratory system: Clear to auscultation. Respiratory effort normal. Cardiovascular system: S1 & S2 heard,  RRR. No murmurs Gastrointestinal system: Abdomen is nondistended, soft and nontender. Normal bowel sounds heard. Central nervous system: Alert and oriented to person place and year. Neglects left visual field Extremities: No edema. No calf tenderness Skin: No cyanosis. No rashes Psychiatry: Judgement and insight appear normal. Mood & affect appropriate.     Data Reviewed: I have personally reviewed following labs and imaging studies  CBC:  Recent Labs Lab 12/25/16 1655  WBC 4.7  NEUTROABS 3.7  HGB 10.7*  HCT 34.2*  MCV 89.3  PLT 268   Basic Metabolic Panel:  Recent Labs Lab 12/25/16 1655  NA 141  K 3.8  CL 106  CO2 26  GLUCOSE 139*  BUN 17  CREATININE 0.90  CALCIUM 8.9   GFR: CrCl cannot be calculated (Unknown ideal weight.). Liver Function Tests:  Recent Labs Lab 12/25/16 1655  AST 17  ALT 20  ALKPHOS 86  BILITOT 0.6  PROT 6.8  ALBUMIN 3.5   No results for input(s): LIPASE, AMYLASE in the last 168 hours.  Recent Labs Lab 12/25/16 1655  AMMONIA 42*   Coagulation Profile:  Recent Labs Lab 12/25/16 1655  INR 1.00   Cardiac Enzymes: No results for input(s): CKTOTAL, CKMB, CKMBINDEX, TROPONINI in the last 168 hours. BNP (last 3 results) No results for input(s): PROBNP in the last 8760 hours. HbA1C:  Recent Labs  12/26/16 0638  HGBA1C 6.3*  CBG: No results for input(s): GLUCAP in the last 168 hours. Lipid Profile:  Recent Labs  12/26/16 0638  CHOL 161  HDL 45  LDLCALC 101*  TRIG 77  CHOLHDL 3.6   Thyroid Function Tests: No results for input(s): TSH, T4TOTAL, FREET4, T3FREE, THYROIDAB in the last 72 hours. Anemia Panel: No results for input(s): VITAMINB12, FOLATE, FERRITIN, TIBC, IRON, RETICCTPCT in the last 72 hours. Sepsis Labs:  Recent Labs Lab 12/25/16 1707 12/25/16 2056 12/26/16 1610  LATICACIDVEN 0.89 1.2 1.6    No results found for this or any previous visit (from the past 240 hour(s)).       Radiology  Studies: Dg Chest 2 View  Result Date: 12/25/2016 CLINICAL DATA:  Left-sided neglect recent pneumonia with persistent cough EXAM: CHEST  2 VIEW COMPARISON:  11/14/2016, 06/15/2016 FINDINGS: Cardiomegaly with central vascular congestion. There are tiny bilateral effusions. There is a mild opacity at the lingula. No pneumothorax. Degenerative changes of the spine. Atherosclerotic calcifications of the aorta. IMPRESSION: 1. Cardiomegaly with central vascular congestion 2. Tiny bilateral effusions. Mild lingular opacity may reflect atelectasis or a small infiltrate. Electronically Signed   By: Jasmine Pang M.D.   On: 12/25/2016 17:39   Ct Head Wo Contrast  Result Date: 12/25/2016 CLINICAL DATA:  Left-sided neglect, extraocular muscles not going to the left EXAM: CT HEAD WITHOUT CONTRAST CT CERVICAL SPINE WITHOUT CONTRAST TECHNIQUE: Multidetector CT imaging of the head and cervical spine was performed following the standard protocol without intravenous contrast. Multiplanar CT image reconstructions of the cervical spine were also generated. COMPARISON:  MRI 06/15/2016, CT brain 06/14/2016 FINDINGS: CT HEAD FINDINGS Brain: Old appearing infarcts/encephalomalacia within the bilateral frontal lobes. Old left occipital lobe infarct. Old right posterior parietal infarct. Low-attenuation with suspected loss of gray-white matter differentiation in the right occipital lobe, series 6, image number 33. No hemorrhage. No focal mass. No midline shift. Moderate atrophy. Moderate-to-marked periventricular, subcortical and deep white matter small vessel ischemic changes. Tiny old appearing lacunar infarct left thalamus. Vascular: No hyperdense vessels.  Carotid artery calcifications. Skull: No fracture.  No suspicious bone lesion. Sinuses/Orbits: Mucosal thickening in the sphenoid and ethmoid sinuses. No acute orbital abnormality. Bilateral lens extraction. Other: None CT CERVICAL SPINE FINDINGS Alignment: Straightening of the  cervical spine. No subluxation. Facet alignment within normal limits. Skull base and vertebrae: Craniovertebral junction appears intact. The vertebral body heights are normal. There is no fracture. Soft tissues and spinal canal: No prevertebral fluid or swelling. No visible canal hematoma. Disc levels: Multilevel degenerate disc changes. This is moderate and most notable at C5-C6, C6-C7 and C7-T1. Multilevel bilateral facet arthropathy. Upper chest: Biapical lung scarring. Tiny hypodense thyroid nodules. Carotid artery calcifications. Other: None IMPRESSION: 1. Suspected area of decreased attenuation/possible infarct in the right occipital lobe. No hemorrhage. Further evaluation with MRI is recommended. 2. Old appearing multifocal infarcts as described above. 3. Moderate-to-marked periventricular, subcortical and deep white matter small vessel ischemic changes 4. Straightening of the cervical spine. No acute fracture or malalignment. Electronically Signed   By: Jasmine Pang M.D.   On: 12/25/2016 19:56   Ct Angio Chest Pe W And/or Wo Contrast  Result Date: 12/25/2016 CLINICAL DATA:  83 year old presenting with acute onset of cough, showed above evaluation to have hypoxia, tachycardia head elevated D-dimer. Former smoker. Current history of hypertension and coronary artery disease. EXAM: CT ANGIOGRAPHY CHEST WITH CONTRAST TECHNIQUE: Multidetector CT imaging of the chest was performed using the standard protocol during bolus administration of intravenous contrast. Multiplanar CT  image reconstructions and MIPs were obtained to evaluate the vascular anatomy. CONTRAST:  150 mL Isovue 370 IV. COMPARISON:  None. FINDINGS: Cardiovascular: Contrast opacification of the pulmonary arteries is excellent. Respiratory motion blurs images throughout the examination. The study is of good diagnostic quality. No filling defects within either main pulmonary artery or their branches in either lung to suggest pulmonary embolism.  Dilated right main pulmonary artery measuring up to 3.5 cm diameter. Mildly dilated left main pulmonary artery measuring up to 3.1 cm diameter. Heart markedly enlarged with left ventricular predominance and mild left ventricular hypertrophy. No pericardial effusion. Extensive LAD and mild right coronary artery atherosclerosis. The thoracic and upper abdominal aorta is not opacified. Moderate atherosclerosis without evidence of aneurysm. Mediastinum/Nodes: Moderate to large hiatal hernia. No pathologic lymphadenopathy. Visualized thyroid gland unremarkable. Lungs/Pleura: Scattered areas of hyperlucency throughout both lungs with other areas of relative ground-glass airspace opacity. Low lung volumes with atelectasis in the lower lobes. No confluent airspace consolidation. No pulmonary parenchymal nodules or masses. Central airways patent with marked central peribronchial thickening. Bronchiectasis in the left upper lobe. No pleural effusions. Upper Abdomen: Diverticulosis involving the visualized splenic flexure the colon. Hiatal hernia as mentioned above. Otherwise unremarkable. Musculoskeletal: DISH involving the lower thoracic spine. Mild osseous demineralization. Review of the MIP images confirms the above findings. IMPRESSION: 1. No evidence of pulmonary embolism. 2. Enlarged main pulmonary arteries bilaterally indicating longstanding pulmonary arterial hypertension. 3. Marked cardiomegaly. LAD and right coronary artery atherosclerosis. 4. Findings consistent with severe asthma and/or bronchitis. No confluent airspace pneumonia. 5. Moderate to large hiatal hernia. Electronically Signed   By: Hulan Saas M.D.   On: 12/25/2016 21:50   Ct Cervical Spine Wo Contrast  Result Date: 12/25/2016 CLINICAL DATA:  Left-sided neglect, extraocular muscles not going to the left EXAM: CT HEAD WITHOUT CONTRAST CT CERVICAL SPINE WITHOUT CONTRAST TECHNIQUE: Multidetector CT imaging of the head and cervical spine was  performed following the standard protocol without intravenous contrast. Multiplanar CT image reconstructions of the cervical spine were also generated. COMPARISON:  MRI 06/15/2016, CT brain 06/14/2016 FINDINGS: CT HEAD FINDINGS Brain: Old appearing infarcts/encephalomalacia within the bilateral frontal lobes. Old left occipital lobe infarct. Old right posterior parietal infarct. Low-attenuation with suspected loss of gray-white matter differentiation in the right occipital lobe, series 6, image number 33. No hemorrhage. No focal mass. No midline shift. Moderate atrophy. Moderate-to-marked periventricular, subcortical and deep white matter small vessel ischemic changes. Tiny old appearing lacunar infarct left thalamus. Vascular: No hyperdense vessels.  Carotid artery calcifications. Skull: No fracture.  No suspicious bone lesion. Sinuses/Orbits: Mucosal thickening in the sphenoid and ethmoid sinuses. No acute orbital abnormality. Bilateral lens extraction. Other: None CT CERVICAL SPINE FINDINGS Alignment: Straightening of the cervical spine. No subluxation. Facet alignment within normal limits. Skull base and vertebrae: Craniovertebral junction appears intact. The vertebral body heights are normal. There is no fracture. Soft tissues and spinal canal: No prevertebral fluid or swelling. No visible canal hematoma. Disc levels: Multilevel degenerate disc changes. This is moderate and most notable at C5-C6, C6-C7 and C7-T1. Multilevel bilateral facet arthropathy. Upper chest: Biapical lung scarring. Tiny hypodense thyroid nodules. Carotid artery calcifications. Other: None IMPRESSION: 1. Suspected area of decreased attenuation/possible infarct in the right occipital lobe. No hemorrhage. Further evaluation with MRI is recommended. 2. Old appearing multifocal infarcts as described above. 3. Moderate-to-marked periventricular, subcortical and deep white matter small vessel ischemic changes 4. Straightening of the cervical  spine. No acute fracture or malalignment. Electronically Signed  By: Jasmine Pang M.D.   On: 12/25/2016 19:56   Mr Brain Wo Contrast  Result Date: 12/25/2016 CLINICAL DATA:  83 y/o  F; left-sided neglect. EXAM: MRI HEAD WITHOUT CONTRAST TECHNIQUE: Multiplanar, multiecho pulse sequences of the brain and surrounding structures were obtained without intravenous contrast. COMPARISON:  12/25/2016 CT of the head.  06/15/2016 MRI of the head. FINDINGS: Brain: Right posteromedial temporal, occipital, and hippocampus diffusion restriction compatible with acute right PCA territory infarction. There additional punctate foci of diffusion restriction within the right caudate head, right thalamus, and right globus pallidus also likely representing acute ischemia. The region of infarction demonstrates mild T2 FLAIR hyperintense signal abnormality without significant mass effect. No hemorrhage identified. Background of advanced chronic microvascular ischemic changes and parenchymal volume loss of the brain. Chronic left occipital, and bilateral frontal small cortical infarctions. Vascular: Poorly visualized due to motion artifact. Skull and upper cervical spine: Normal marrow signal. Sinuses/Orbits: Negative. Other: None. IMPRESSION: 1. Extensive motion artifact. 2. Right PCA distribution acute infarction. Additional punctate acute infarcts within the right basal ganglia. No definite associated hemorrhage identified. 3. Background of advanced chronic microvascular ischemic changes, parenchymal volume loss, and small chronic cortical infarcts. These results will be called to the ordering clinician or representative by the Radiologist Assistant, and communication documented in the PACS or zVision Dashboard. Electronically Signed   By: Mitzi Hansen M.D.   On: 12/25/2016 22:28   Mr Maxine Glenn Head/brain RU Cm  Result Date: 12/26/2016 CLINICAL DATA:  Initial evaluation for acute stroke. EXAM: MRA HEAD WITHOUT CONTRAST  TECHNIQUE: Angiographic images of the Circle of Willis were obtained using MRA technique without intravenous contrast. COMPARISON:  Prior MRI from earlier the same day and MRA from 06/15/2016 FINDINGS: ANTERIOR CIRCULATION: Study is moderately degraded by motion artifact. Distal cervical segments of the internal carotid arteries are widely patent to the skullbase. Petrous, cavernous, supraclinoid segments patent bilaterally without flow-limiting stenosis. A1 segments patent bilaterally. Anterior communicating artery grossly normal. Anterior cerebral arteries patent to their distal aspects without obvious stenosis. M1 segments patent without stenosis or occlusion. Probable occlusion and/or severe stenosis of the lateral division of the right MCA again noted (series 350, image 16), similar to previous. Distal MCA branches otherwise fairly symmetric. POSTERIOR CIRCULATION: Left vertebral artery dominant and patent to the vertebrobasilar junction. Right vertebral artery diffusely hypoplastic, and appears to terminate in PICA. Basilar artery tortuous with mild irregularity and narrowing proximally. Basilar artery otherwise widely patent to its distal aspect. Superior cerebral artery is patent proximally, not well evaluated distally. Left PCA supplied via the basilar artery via a patent P1 segment. Small left posterior communicating artery noted as well. Superimposed severe distal left P2 stenosis (series 354, image 8), similar to prior. Left PCA attenuated distally, and may be occluded, similar. Fetal type right PCA supplied via the right posterior communicating artery. There is proximal right P2 occlusion (series 3, image 107), compatible with acute right PCA infarct. No aneurysm or vascular malformation. IMPRESSION: 1. Abrupt proximal right P2 occlusion, compatible with acute right PCA territory infarct. Right PCA is of fetal type. 2. Severe distal left P2 stenosis, with probable distal left PCA occlusion, stable. 3.  Severe stenosis/occlusion of the lateral branch of the right MCA, stable. 4. Mild atheromatous irregularity within the proximal basilar artery without flow-limiting stenosis, also stable. Electronically Signed   By: Rise Mu M.D.   On: 12/26/2016 00:37        Scheduled Meds: .  stroke: mapping our early stages of  recovery book   Does not apply Once  . aspirin  81 mg Oral Daily  . atorvastatin  80 mg Oral QPM  . azithromycin  500 mg Intravenous Q24H  . clopidogrel  75 mg Oral Daily  . enoxaparin (LOVENOX) injection  40 mg Subcutaneous Q24H   Continuous Infusions:   LOS: 2 days     Jacquelin Hawking [facility] 12/27/2016, 2:22 PM Pager: [phone]  If 7PM-7AM, please contact night-coverage [email or web address] Password [email or web address] 12/27/2016, 2:22 PM

## 2016-12-27 NOTE — Progress Notes (Addendum)
Patient up to chair with 2 assist for stand and pivot.  Patient difficult to arouse, dr. Caleb Poppnettey and dr. Shela Commonsj xu notified. Once aroused, patient is able to follow commands, stated to nurse "just let me sleep."

## 2016-12-28 LAB — VITAMIN B12: VITAMIN B 12: 329 pg/mL (ref 180–914)

## 2016-12-28 LAB — AMMONIA: Ammonia: <9 umol/L — ABNORMAL LOW (ref 9–35)

## 2016-12-28 LAB — TSH: TSH: 4.589 u[IU]/mL — AB (ref 0.350–4.500)

## 2016-12-28 MED ORDER — VITAMIN B-12 1000 MCG PO TABS
1000.0000 ug | ORAL_TABLET | Freq: Every day | ORAL | Status: DC
  Administered 2016-12-28 – 2016-12-29 (×2): 1000 ug via ORAL
  Filled 2016-12-28: qty 1

## 2016-12-28 NOTE — Progress Notes (Signed)
STROKE TEAM PROGRESS NOTE   SUBJECTIVE (INTERVAL HISTORY) Husband is at the bedside. She is sitting in chair, still has left hemianopia. And stated that patient sometimes does have memory issues, however, patient denied.     OBJECTIVE Temp:  [97.8 F (36.6 C)-99.1 F (37.3 C)] 99 F (37.2 C) (03/11 0500) Pulse Rate:  [49-86] 64 (03/11 0500) Cardiac Rhythm: Normal sinus rhythm (03/11 0100) Resp:  [18-20] 18 (03/11 0500) BP: (130-164)/(57-78) 138/57 (03/11 0500) SpO2:  [98 %-100 %] 98 % (03/11 0500)  CBC:   Recent Labs Lab 12/25/16 1655  WBC 4.7  NEUTROABS 3.7  HGB 10.7*  HCT 34.2*  MCV 89.3  PLT 268    Basic Metabolic Panel:   Recent Labs Lab 12/25/16 1655  NA 141  K 3.8  CL 106  CO2 26  GLUCOSE 139*  BUN 17  CREATININE 0.90  CALCIUM 8.9    Lipid Panel:     Component Value Date/Time   CHOL 161 12/26/2016 0638   TRIG 77 12/26/2016 0638   HDL 45 12/26/2016 0638   CHOLHDL 3.6 12/26/2016 0638   VLDL 15 12/26/2016 0638   LDLCALC 101 (H) 12/26/2016 0638   HgbA1c:  Lab Results  Component Value Date   HGBA1C 6.3 (H) 12/26/2016   Urine Drug Screen:     Component Value Date/Time   LABOPIA NONE DETECTED 11/14/2016 1914   COCAINSCRNUR NONE DETECTED 11/14/2016 1914   LABBENZ NONE DETECTED 11/14/2016 1914   AMPHETMU NONE DETECTED 11/14/2016 1914   THCU NONE DETECTED 11/14/2016 1914   LABBARB NONE DETECTED 11/14/2016 1914      IMAGING I have personally reviewed the radiological images below and agree with the radiology interpretations.  Dg Chest 2 View 12/25/2016 1. Cardiomegaly with central vascular congestion  2. Tiny bilateral effusions. Mild lingular opacity may reflect atelectasis or a small infiltrate.   Ct Head Wo Contrast 12/25/2016 1. Suspected area of decreased attenuation/possible infarct in the right occipital lobe. No hemorrhage. Further evaluation with MRI is recommended.  2. Old appearing multifocal infarcts as described above.  3.  Moderate-to-marked periventricular, subcortical and deep white matter small vessel ischemic changes   Ct Angio Chest Pe W And/or Wo Contrast 12/25/2016 1. No evidence of pulmonary embolism.  2. Enlarged main pulmonary arteries bilaterally indicating longstanding pulmonary arterial hypertension.  3. Marked cardiomegaly. LAD and right coronary artery atherosclerosis.  4. Findings consistent with severe asthma and/or bronchitis. No confluent airspace pneumonia.  5. Moderate to large hiatal hernia.   Ct Cervical Spine Wo Contrast 12/25/2016 Straightening of the cervical spine. No acute fracture or malalignment.   Mr Brain Wo Contrast 12/25/2016 1. Extensive motion artifact.  2. Right PCA distribution acute infarction. Additional punctate acute infarcts within the right basal ganglia. No definite associated hemorrhage identified.  3. Background of advanced chronic microvascular ischemic changes, parenchymal volume loss, and small chronic cortical infarcts.   Mr Maxine GlennMra Head/brain Wo Cm 12/26/2016 1. Abrupt proximal right P2 occlusion, compatible with acute right PCA territory infarct. Right PCA is of fetal type.  2. Severe distal left P2 stenosis, with probable distal left PCA occlusion, stable.  3. Severe stenosis/occlusion of the lateral branch of the right MCA, stable.  4. Mild atheromatous irregularity within the proximal basilar artery without flow-limiting stenosis, also stable.   LE venous doppler - There is no obvious evidence of DVT or SVT noted in the visualized veins of the bilateral lower extremities  CUS - Bilateral 1-39% ICA stenosis, antegrade vertebral flow.  Difficult exam due to tortuous and pulsatile vessels.  TTE - Left ventricle: The cavity size was normal. There was mild   concentric hypertrophy. Systolic function was normal. The   estimated ejection fraction was in the range of 55% to 60%. Wall   motion was normal; there were no regional wall motion   abnormalities. Doppler  parameters are consistent with abnormal   left ventricular relaxation (grade 1 diastolic dysfunction).   Doppler parameters are consistent with high ventricular filling   pressure. - Aortic valve: Transvalvular velocity was within the normal range.   There was no stenosis. There was no regurgitation. - Mitral valve: Transvalvular velocity was within the normal range.   There was no evidence for stenosis. There was mild regurgitation. - Left atrium: The atrium was severely dilated. - Right ventricle: The cavity size was normal. Wall thickness was   normal. Systolic function was normal. - Tricuspid valve: There was mild regurgitation. - Pulmonary arteries: Systolic pressure was moderately increased.   PA peak pressure: 49 mm Hg (S).  TEE pending   PHYSICAL EXAM  Temp:  [97.8 F (36.6 C)-99.1 F (37.3 C)] 99 F (37.2 C) (03/11 0500) Pulse Rate:  [49-86] 64 (03/11 0500) Resp:  [18-20] 18 (03/11 0500) BP: (130-164)/(57-78) 138/57 (03/11 0500) SpO2:  [98 %-100 %] 98 % (03/11 0500)  General - Well nourished, well developed, in no apparent distress.  Ophthalmologic - Fundi not visualized due to eye movement.  Cardiovascular - Regular rate and rhythm.  Mental Status -  Level of arousal and orientation to month, place, and person were intact, but not to year. Language including expression, naming, repetition, comprehension was assessed and found intact.  Cranial Nerves II - XII - II - left homonymous hemianopia. III, IV, VI - Extraocular movements intact. V - Facial sensation intact bilaterally. VII - Facial movement intact bilaterally. VIII - Hearing & vestibular intact bilaterally. X - Palate elevates symmetrically. XI - Chin turning & shoulder shrug intact bilaterally. XII - Tongue protrusion intact.  Motor Strength - The patient's strength was normal in all extremities and pronator drift was absent.  Bulk was normal and fasciculations were absent.   Motor Tone - Muscle  tone was assessed at the neck and appendages and was normal.  Reflexes - The patient's reflexes were 1+ in all extremities and she had no pathological reflexes.  Sensory - Light touch, temperature/pinprick, vibration were assessed and were symmetrical.    Coordination - The patient had normal movements in the hands and feet with no ataxia or dysmetria.  Tremor was absent.  Gait and Station - deferred.   ASSESSMENT/PLAN [name] is a 83 y.o. female with history of a previous stroke, recent pneumonia, hypertension, coronary artery disease, and congestive heart failure presenting with confusion, left-sided neglect, cough, and hypoxia. She did not receive IV t-PA due to late presentation.  Strokes:  Right PCA distribution and right BG infarcts felt to be embolic from an unknown source.  Resultant  Left hemianopia  MRI - Right PCA distribution acute infarction. Additional punctate acute infarcts within the right basal ganglia.  MRA - right PCA occlusion, stable left PCA distal stenosis and b/l MCA branches asthero  Carotid Doppler - no significant stenosis  2D Echo - EF 55-60%. No cardiac source of emboli identified.  LE venous doppler no DVT  TEE and loop recorder to evaluate cardiac source of stroke  LDL - 101  HgbA1c - 6.3  VTE prophylaxis - Lovenox DIET  SOFT Room service appropriate? Yes; Fluid consistency: Thin Diet NPO time specified Except for: Sips with Meds  clopidogrel 75 mg daily prior to admission, now on aspirin 81 mg daily and clopidogrel 75 mg daily  Patient counseled to be compliant with her antithrombotic medications  Ongoing aggressive stroke risk factor management  Therapy recommendations:  SNF recommended  Disposition: - Pending  Hx of stroke  10/2005 MRI - old left MCA/PCA infarct  12/2006 MRI - old b/l MCA/PCA infarct  05/2016 MRI new old left MCA / frontal infarct - MRA left PCA distal stenosis and b/l MCA branch asthero  12/2016 MRI -  right PCA infarct, right BG punctate infarct  Hypertension  Stable  Permissive hypertension (OK if < 220/120) but gradually normalize in 5-7 days  Long-term BP goal normotensive  Hyperlipidemia  Home meds:  Lipitor 40 mg daily resumed in hospital  LDL 101, goal < 70  Increase Lipitor to 80 mg daily  Continue statin at discharge  Pre-Diabetes  HgbA1c 6.3, goal < 7.0  Controlled  Other Stroke Risk Factors  Advanced age  Former smoker  Coronary artery disease  Other Active Problems  Anemia - 10.7 / 34.2  Low B12 - 05/2016 261 - repeat B12 and give supplement  High ammonia - 42 on admission - repeat Ammonia  TSH pending  Hospital day # 3  Marvel Plan, MD PhD Stroke Neurology 12/28/2016 5:42 PM   To contact Stroke Continuity provider, please refer to [email or web address]. After hours, contact General Neurology

## 2016-12-28 NOTE — Evaluation (Signed)
Speech Language Pathology Evaluation Patient Details Name: [name] MRN: [id number] DOB: 11/20/1932 Today's Date: 12/28/2016 Time: [id number] SLP Time Calculation (min) (ACUTE ONLY): 23 min  Problem List:  Patient Active Problem List   Diagnosis Date Noted  . Acute bronchitis 12/26/2016  . Acute ischemic stroke (HCC) 12/25/2016  . Benign hypertensive heart disease without heart failure 06/19/2016  . Chronic CHF (congestive heart failure) (HCC) 06/19/2016  . B12 deficiency 06/17/2016  . Metabolic encephalopathy 06/15/2016  . 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 Hyperlipidemia 06/14/2016  . Hypokalemia 06/14/2016  . UTI (urinary tract infection) 06/14/2016  . Unspecified dementia with behavioral disturbance 06/14/2016   Past Medical History:  Past Medical History:  Diagnosis Date  . CHF (congestive heart failure) (HCC)   . Coronary artery disease   . Hypertension   . Stroke (cerebrum) (HCC) 06/14/2016   Past Surgical History: History reviewed. No pertinent surgical history. HPI:  Kristine BurlingtonJuanita B Loweryis a 83 y.o.femalewith medical history significant of dementia, prior stroke with no residual symptoms on aspirin and Plavix, hypertension, CAD, CHF on Lasix, and recent pneumonia who presents with a fall, confusion, left sided neglect, cough, and hypoxia. History was provided by both patient and her husband who was called. According to husband (who is legally blind), patient was last normal last night. He reports that this morning, patient spent several hours in the bathroom sitting. He says that when he checked on her, she was leaning to the right and turning towards the right constantly. He reports that this is new and different. He says that she had a unwitnessed fall but denied any head injury or headache. He reports that she has had a cough with some mucus production. He reports that she does not use oxygen at baseline. She does report some generalized leg edema.  Patient denies  any symptoms. She denies any headache, neck pain, chest pain, or abdominal pain. She denies any pain in her extremities. Denies any residual symptoms from her prior stroke.  MRI is showing right PCA infarct and punctate infarct in the right basal ganglia.   Assessment / Plan / Recommendation Clinical Impression  Cognitive/linguistic and motor speech evaluation was completed.  Cranial nerve exam was completed and unremarkable.  The patient's speech was clear and easy to understand.  The patient was oriented to person and situation only.  She was disoriented to place and time.  She was able to immediately recall 3 novel words but given delay semantic cues were required to facilitate recall.  The patient struggled to follow all parts of a 3 step direction.  She seemed to have difficulty attending to task and was perseverative in her behaviors.  She was also noted to have a left neglect which affected her ability to perform reading tasks even given max cues to call attention to the left side of the paper.  Also question other visual deficits.  Suspect given history of dementia that some deficits are baseline but the left neglect is new.  Recommend ST therapy at this and next level of care to address cognitive deficits specifically the left neglect to improve functional status.      SLP Assessment  SLP Recommendation/Assessment: Patient needs continued Speech Lanaguage Pathology Services SLP Visit Diagnosis: Attention and concentration deficit Attention and concentration deficit following: Cerebral infarction    Follow Up Recommendations  Skilled Nursing facility    Frequency and Duration min 2x/week  2 weeks      SLP Evaluation Cognition  Overall Cognitive Status:  Impaired/Different from baseline Arousal/Alertness: Awake/alert Orientation Level: Oriented to person;Oriented to situation;Disoriented to time;Disoriented to place Attention: Sustained Sustained Attention: Impaired Sustained Attention  Impairment: Verbal basic Memory: Impaired Memory Impairment: Decreased recall of new information (3/3 immediate recall. 0/3 delayed recall) Problem Solving: Impaired Problem Solving Impairment: Verbal basic Behaviors: Perseveration       Comprehension  Auditory Comprehension Overall Auditory Comprehension: Impaired Commands: Impaired Multistep Basic Commands: 50-74% accurate Conversation: Simple Interfering Components: Attention EffectiveTechniques: Repetition;Extra processing time Reading Comprehension Reading Status: Impaired Word level: Not tested Sentence Level: Impaired Paragraph Level: Not tested Functional Environmental (signs, name badge): Not tested Interfering Components: Left neglect/inattention    Expression Expression Primary Mode of Expression: Verbal Verbal Expression Overall Verbal Expression: Appears within functional limits for tasks assessed Initiation: No impairment Automatic Speech: Name;Social Response Level of Generative/Spontaneous Verbalization: Sentence Repetition: No impairment Naming: No impairment Pragmatics: No impairment Non-Verbal Means of Communication: Not applicable Written Expression Written Expression: Not tested   Oral / Motor  Oral Motor/Sensory Function Overall Oral Motor/Sensory Function: Within functional limits Motor Speech Overall Motor Speech: Appears within functional limits for tasks assessed Respiration: Within functional limits Phonation: Normal Resonance: Within functional limits Articulation: Within functional limitis Intelligibility: Intelligible Motor Planning: Witnin functional limits Motor Speech Errors: Not applicable   GO                   Dimas Aguas, MA, CCC-SLP Acute Rehab SLP [phone] [place] 12/28/2016, 2:33 PM

## 2016-12-28 NOTE — Progress Notes (Addendum)
PROGRESS NOTE    [name]  ZOX:[id number] DOB: 02-28-1933 DOA: 12/25/2016 PCP: Lolita PatellaEADE,ROBERT ALEXANDER, MD   Brief Narrative: Kristine HymenJuanita B Chovan is a 83 y.o. female with medical history significant of dementia, prior stroke with no residual symptoms on aspirin and Plavix, hypertension, CAD, CHF on Lasix. She presented with a new stroke.   Assessment & Plan:   Principal Problem:   Acute ischemic stroke [facility](HCC) Active Problems:   Acute bronchitis   Acute ischemic stroke MRI significant for right PCA distribution infarct with additional punctate infarcts within right basal ganglia. Echo significant for EF of 55-60% with grade 1 diastolic dysfunction.  -Neuro recommendations: loop recorder for 12/29/16 -aspirin 81mg  -Plavix 75mg  -atorvastatin 80mg   Acute bronchitis Appears asymptomatic. Resolved. -continue azithromycin  Chronic diastolic heart failure EF of 55-50% with grade 1 diastolic dysfunction. Euvolemic   DVT prophylaxis: Lovenox Code Status: Full code Family Communication: None at bedside Disposition Plan: Discharge pending stroke workup   Consultants:   Neurology  Procedures:   Echocardiogram (12/26/2016)  Antimicrobials:   Ceftriaxone  Azithromycin    Subjective: No chest pain or dyspnea. No abdominal pain. Last bowel movement yesterday.  Objective: Vitals:   12/27/16 2100 12/28/16 0100 12/28/16 0500 12/28/16 0920  BP: (!) 142/78 133/74 (!) 138/57 (!) 151/66  Pulse: 86 71 64 81  Resp: 18 18 18 18   Temp: 99.1 F (37.3 C) 98.9 F (37.2 C) 99 F (37.2 C) 98.8 F (37.1 C)  TempSrc: Oral Oral Oral Oral  SpO2: 100% 100% 98% 99%    Intake/Output Summary (Last 24 hours) at 12/28/16 1229 Last data filed at 12/28/16 0800  Gross per 24 hour  Intake              250 ml  Output              300 ml  Net              -50 ml   There were no vitals filed for this visit.  Examination:  General exam: Appears calm and comfortable  Respiratory  system: Clear to auscultation. Respiratory effort normal. Cardiovascular system: S1 & S2 heard, RRR. No murmurs Gastrointestinal system: Abdomen is nondistended, soft and nontender. Normal bowel sounds heard. Central nervous system: Alert and oriented to person place and year. Neglects left visual field Extremities: No edema. No calf tenderness Skin: No cyanosis. No rashes Psychiatry: Judgement and insight appear normal. Mood & affect appropriate.     Data Reviewed: I have personally reviewed following labs and imaging studies  CBC:  Recent Labs Lab 12/25/16 1655  WBC 4.7  NEUTROABS 3.7  HGB 10.7*  HCT 34.2*  MCV 89.3  PLT 268   Basic Metabolic Panel:  Recent Labs Lab 12/25/16 1655  NA 141  K 3.8  CL 106  CO2 26  GLUCOSE 139*  BUN 17  CREATININE 0.90  CALCIUM 8.9   GFR: CrCl cannot be calculated (Unknown ideal weight.). Liver Function Tests:  Recent Labs Lab 12/25/16 1655  AST 17  ALT 20  ALKPHOS 86  BILITOT 0.6  PROT 6.8  ALBUMIN 3.5   No results for input(s): LIPASE, AMYLASE in the last 168 hours.  Recent Labs Lab 12/25/16 1655  AMMONIA 42*   Coagulation Profile:  Recent Labs Lab 12/25/16 1655  INR 1.00   Cardiac Enzymes: No results for input(s): CKTOTAL, CKMB, CKMBINDEX, TROPONINI in the last 168 hours. BNP (last 3 results) No results for input(s): PROBNP in the last 8760  hours. HbA1C:  Recent Labs  12/26/16 0638  HGBA1C 6.3*   CBG: No results for input(s): GLUCAP in the last 168 hours. Lipid Profile:  Recent Labs  12/26/16 0638  CHOL 161  HDL 45  LDLCALC 101*  TRIG 77  CHOLHDL 3.6   Thyroid Function Tests: No results for input(s): TSH, T4TOTAL, FREET4, T3FREE, THYROIDAB in the last 72 hours. Anemia Panel: No results for input(s): VITAMINB12, FOLATE, FERRITIN, TIBC, IRON, RETICCTPCT in the last 72 hours. Sepsis Labs:  Recent Labs Lab 12/25/16 1707 12/25/16 2056 12/26/16 1610  LATICACIDVEN 0.89 1.2 1.6    No  results found for this or any previous visit (from the past 240 hour(s)).       Radiology Studies: No results found.      Scheduled Meds: .  stroke: mapping our early stages of recovery book   Does not apply Once  . aspirin  81 mg Oral Daily  . atorvastatin  80 mg Oral QPM  . azithromycin  500 mg Intravenous Q24H  . clopidogrel  75 mg Oral Daily  . enoxaparin (LOVENOX) injection  40 mg Subcutaneous Q24H   Continuous Infusions:   LOS: 3 days     Jacquelin Hawking [facility] 12/28/2016, 12:29 PM Pager: [phone]  If 7PM-7AM, please contact night-coverage [email or web address] Password [facility] 12/28/2016, 12:29 PM

## 2016-12-29 DIAGNOSIS — I5032 Chronic diastolic (congestive) heart failure: Secondary | ICD-10-CM

## 2016-12-29 NOTE — Progress Notes (Addendum)
    [facility] has been requested to perform a transesophageal echocardiogram on [name] for stroke.  After careful review of history and examination, the risks and benefits of transesophageal echocardiogram have been explained including risks of esophageal damage, perforation (1:10,000 risk), bleeding, pharyngeal hematoma as well as other potential complications associated with conscious sedation including aspiration, arrhythmia, respiratory failure and death. Alternatives to treatment were discussed, questions were answered. I have discussed the procedure with the patient, who has dimentia, and her husband, [name], via phone.  The patient and her husband are willing to proceed.    The patient has anemia. Current Hgb is 10.7. No active bleeding noted. Plt 268.   She is scheduled for TEE on 12/30/16 at 9am to be done by Dr. Anne FuSkains. She will be NPO after midnight.   Kristine BonJanine Simran Mannis, NP  12/29/2016 1:39 PM

## 2016-12-29 NOTE — Progress Notes (Signed)
Physical Therapy Treatment Patient Details Name: [name] MRN: [id number] DOB: April 27, 1933 Today's Date: 12/29/2016    History of Present Illness 83 y.o. female admitted with L sided neglect, fall. Imaging showed acute R occipital infarct. PMH of dementia, CHF, CAD, CVA.     PT Comments    Pt tolerated increased activity level today. She ambulated 10' with RW and min A and performed seated UE/LE exercises. She continues to neglect L visual field, orientation improved however stated year is 2020 (she correctly stated her birthdate and current location).    Follow Up Recommendations  SNF     Equipment Recommendations  Wheelchair (measurements PT)    Recommendations for Other Services OT consult     Precautions / Restrictions Precautions Precautions: Fall Precaution Comments: pt reports several falls in the past 1 year, per chart she fell just prior to admission Restrictions Weight Bearing Restrictions: No    Mobility  Bed Mobility Overal bed mobility: Needs Assistance Bed Mobility: Supine to Sit     Supine to sit: HOB elevated;Mod assist     General bed mobility comments: mod A to raise trunk and advance BLEs  Transfers Overall transfer level: Needs assistance Equipment used: Rolling walker (2 wheeled) Transfers: Sit to/from Stand Sit to Stand: Mod assist;From elevated surface         General transfer comment: mod A to rise  Ambulation/Gait Ambulation/Gait assistance: Min guard Ambulation Distance (Feet): 10 Feet Assistive device: Rolling walker (2 wheeled) Gait Pattern/deviations: Step-to pattern;Decreased step length - right;Decreased step length - left   Gait velocity interpretation: Below normal speed for age/gender General Gait Details: 5' forward, 5' backward with RW, distance limited by fatigue, neglects L visual field, min/guard for safety, no LOB   Stairs            Wheelchair Mobility    Modified Rankin (Stroke Patients Only)        Balance Overall balance assessment: Needs assistance Sitting-balance support: Bilateral upper extremity supported;Feet supported Sitting balance-Leahy Scale: Good Sitting balance - Comments: EOB with no back support   Standing balance support: Bilateral upper extremity supported Standing balance-Leahy Scale: Poor Standing balance comment: relies on BUE support                    Cognition Arousal/Alertness: Awake/alert Behavior During Therapy: WFL for tasks assessed/performed Overall Cognitive Status: No family/caregiver present to determine baseline cognitive functioning                 General Comments: noted h/o dementia, pt oriented to self, correctly stated full birthdate, stated current year is 2020, oriented to location    Exercises General Exercises - Upper Extremity Shoulder Flexion: AROM;Both;10 reps;Seated General Exercises - Lower Extremity Ankle Circles/Pumps: AROM;Both;10 reps;Seated Long Arc Quad: AROM;Both;10 reps;Seated Hip Flexion/Marching: AROM;Both;10 reps;Seated Shoulder Exercises Shoulder Flexion: AAROM;Left;10 reps;Seated    General Comments        Pertinent Vitals/Pain Pain Assessment: No/denies pain    Home Living                      Prior Function            PT Goals (current goals can now be found in the care plan section) Acute Rehab PT Goals Patient Stated Goal: to get home, would like to eat (RN notified) PT Goal Formulation: Patient unable to participate in goal setting Time For Goal Achievement: 01/09/17 Potential to Achieve Goals: Good Progress towards PT goals: Progressing  toward goals    Frequency    Min 3X/week      PT Plan Current plan remains appropriate    Co-evaluation             End of Session Equipment Utilized During Treatment: Gait belt Activity Tolerance: Patient tolerated treatment well;Patient limited by fatigue Patient left: in chair;with call bell/phone within  reach;with chair alarm set Nurse Communication: Mobility status (pt soiled, has no phone in room, and is requesting food) PT Visit Diagnosis: Hemiplegia and hemiparesis;History of falling (Z91.81);Difficulty in walking, not elsewhere classified (R26.2) Hemiplegia - dominant/non-dominant: Non-dominant Hemiplegia - caused by: Cerebral infarction     Time: [id number] PT Time Calculation (min) (ACUTE ONLY): 16 min  Charges:  $Gait Training: 8-22 mins                    G Codes:       Tamala SerUhlenberg, Nichola Warren Kistler 12/29/2016, 3:33 PM [phone]

## 2016-12-29 NOTE — Care Management Note (Signed)
Case Management Note  Patient Details  Name: [name] MRN: [id number] Date of Birth: 27-Jul-1933  Subjective/Objective:                    Action/Plan: Plan is for SNF when patient is medically ready. CM following for further d/c needs.   Expected Discharge Date:                  Expected Discharge Plan:     In-House Referral:     Discharge planning Services     Post Acute Care Choice:    Choice offered to:     DME Arranged:    DME Agency:     HH Arranged:    HH Agency:     Status of Service:     If discussed at [employer] of [employer] Meetings, dates discussed:    Additional Comments:  Kermit BaloKelli F Tanashia Ciesla, RN 12/29/2016, 12:48 PM

## 2016-12-29 NOTE — Clinical Social Work Placement (Addendum)
  12/29/16 - NO DISCHARGE TODAY - [facility] and husband contacted and updated.  CLINICAL SOCIAL WORK PLACEMENT  NOTE DISCHARGED TO [facility]IA [facility]BULANCE  Date:  12/29/2016  Patient Details  Name: [name] MRN: [id number] Date of Birth: [birth year]  Clinical Social Work is seeking post-discharge placement for this patient at the Skilled  Nursing Facility level of care (*CSW will initial, date and re-position this form in  chart as items are completed):  No   Patient/family provided with [facility] Clinical Social Work Department's list of facilities offering this level of care within the geographic area requested by the patient (or if unable, by the patient's family).  Yes   Patient/family informed of their freedom to choose among providers that offer the needed level of care, that participate in Medicare, Medicaid or managed care program needed by the patient, have an available bed and are willing to accept the patient.  No   Patient/family informed of [facility]'s ownership interest in [facility] and [facility], as well as of the fact that they are under no obligation to receive care at these facilities.  PASRR submitted to EDS on       PASRR number received on       Existing PASRR number confirmed on 12/29/16     FL2 transmitted to all facilities in geographic area requested by pt/family on 12/29/16     FL2 transmitted to all facilities within larger geographic area on       Patient informed that his/her managed care company has contracts with or will negotiate with certain facilities, including the following:        No (Mr. Janice NorrieLowery chose [name] )   Patient/family informed of bed offers received.  [name]     Physician recommends and patient chooses bed at      [name]  Patient to be transferred to facility by ambulance     Patient family notified on  12/29/16 of transfer.  Name of family member notified:  Abran Richardoger Angelica     PHYSICIAN       Additional Comment:    _______________________________________________ Cristobal Goldmannrawford, [place]field Dulay Bradley, LCSW 12/29/2016, 1:38 PM

## 2016-12-29 NOTE — Progress Notes (Signed)
PROGRESS NOTE    Kristine HymenJuanita B Orwick  UJW:[id number] DOB: 1933-07-09 DOA: 12/25/2016 PCP: Lolita PatellaEADE,ROBERT ALEXANDER, MD   Brief Narrative: [name] is a 83 y.o. female with medical history significant of dementia, prior stroke with no residual symptoms on aspirin and Plavix, hypertension, CAD, CHF on Lasix. She presented with a new stroke.   Assessment & Plan:   Principal Problem:   Acute ischemic stroke [facility](HCC) Active Problems:   Acute bronchitis   Chronic diastolic heart failure (HCC)   Acute ischemic stroke MRI significant for right PCA distribution infarct with additional punctate infarcts within right basal ganglia. Echo significant for EF of 55-60% with grade 1 diastolic dysfunction.  -Neuro recommendations: loop recorder today -aspirin 81mg  -Plavix 75mg  -atorvastatin 80mg   Acute bronchitis Appears asymptomatic. Resolved. -continue azithromycin  Chronic diastolic heart failure EF of 55-50% with grade 1 diastolic dysfunction. Euvolemic   DVT prophylaxis: Lovenox Code Status: Full code Family Communication: None at bedside Disposition Plan: Discharge pending stroke workup   Consultants:   Neurology  Procedures:   Echocardiogram (12/26/2016)  Antimicrobials:   Ceftriaxone  Azithromycin    Subjective: No chest pain or dyspnea. No abdominal pain.  Objective: Vitals:   12/28/16 2057 12/29/16 0109 12/29/16 0509 12/29/16 0931  BP: (!) 148/62 (!) 145/78 132/80 140/62  Pulse: 72 71 67 68  Resp: 20 18 16 18   Temp: 98.6 F (37 C) 97.9 F (36.6 C) 98 F (36.7 C) 98.4 F (36.9 C)  TempSrc: Oral Oral Oral Oral  SpO2: 100% 100% 98% 99%   No intake or output data in the 24 hours ending 12/29/16 1250 There were no vitals filed for this visit.  Examination:  General exam: Appears calm and comfortable  Respiratory system: Clear to auscultation. Respiratory effort normal. Cardiovascular system: S1 & S2 heard, RRR. No murmurs Gastrointestinal system:  Abdomen is nondistended, soft and nontender. Normal bowel sounds heard. Central nervous system: Alert and oriented to person place and year. Neglects left visual field Extremities: No edema. No calf tenderness Skin: No cyanosis. No rashes Psychiatry: Judgement and insight appear normal. Mood & affect appropriate.     Data Reviewed: I have personally reviewed following labs and imaging studies  CBC:  Recent Labs Lab 12/25/16 1655  WBC 4.7  NEUTROABS 3.7  HGB 10.7*  HCT 34.2*  MCV 89.3  PLT 268   Basic Metabolic Panel:  Recent Labs Lab 12/25/16 1655  NA 141  K 3.8  CL 106  CO2 26  GLUCOSE 139*  BUN 17  CREATININE 0.90  CALCIUM 8.9   GFR: CrCl cannot be calculated (Unknown ideal weight.). Liver Function Tests:  Recent Labs Lab 12/25/16 1655  AST 17  ALT 20  ALKPHOS 86  BILITOT 0.6  PROT 6.8  ALBUMIN 3.5   No results for input(s): LIPASE, AMYLASE in the last 168 hours.  Recent Labs Lab 12/25/16 1655 12/28/16 1739  AMMONIA 42* <9*   Coagulation Profile:  Recent Labs Lab 12/25/16 1655  INR 1.00   Cardiac Enzymes: No results for input(s): CKTOTAL, CKMB, CKMBINDEX, TROPONINI in the last 168 hours. BNP (last 3 results) No results for input(s): PROBNP in the last 8760 hours. HbA1C: No results for input(s): HGBA1C in the last 72 hours. CBG: No results for input(s): GLUCAP in the last 168 hours. Lipid Profile: No results for input(s): CHOL, HDL, LDLCALC, TRIG, CHOLHDL, LDLDIRECT in the last 72 hours. Thyroid Function Tests:  Recent Labs  12/28/16 1739  TSH 4.589*   Anemia Panel:  Recent Labs  12/28/16 1739  VITAMINB12 329   Sepsis Labs:  Recent Labs Lab 12/25/16 1707 12/25/16 2056 12/26/16 1610  LATICACIDVEN 0.89 1.2 1.6    No results found for this or any previous visit (from the past 240 hour(s)).       Radiology Studies: No results found.      Scheduled Meds: .  stroke: mapping our early stages of recovery book    Does not apply Once  . aspirin  81 mg Oral Daily  . atorvastatin  80 mg Oral QPM  . azithromycin  500 mg Intravenous Q24H  . clopidogrel  75 mg Oral Daily  . enoxaparin (LOVENOX) injection  40 mg Subcutaneous Q24H  . vitamin B-12  1,000 mcg Oral Daily   Continuous Infusions:   LOS: 4 days     Jacquelin Hawking [facility] 12/29/2016, 12:50 PM Pager: [phone]  If 7PM-7AM, please contact night-coverage [email or web address] Password [facility] 12/29/2016, 12:50 PM

## 2016-12-29 NOTE — NC FL2 (Signed)
[place] MEDICAID FL2 LEVEL OF CARE SCREENING TOOL     IDENTIFICATION  Patient Name: [name] Birthdate: 03/22/1933 Sex: female Admission Date (Current Location): 12/25/2016  [facility] and [place] Number:  Producer, television/film/video and Address:  [facility] [place], [address], [place], [place] [address]      Provider Number: [id number]  Attending Physician Name and Address:  Narda Bonds, MD  Relative Name and Phone Number:  Kiersten, Coss; [phone]     Current Level of Care: Hospital Recommended Level of Care: Skilled Nursing Facility Prior Approval Number:    Date Approved/Denied:   PASRR Number: [id number]   (Eff. 07/18/08)  Discharge Plan: SNF    Current Diagnoses: Patient Active Problem List   Diagnosis Date Noted  . Chronic diastolic heart failure (HCC) 12/29/2016  . Acute bronchitis 12/26/2016  . Acute ischemic stroke (HCC) 12/25/2016  . Benign hypertensive heart disease without heart failure 06/19/2016  . Chronic CHF (congestive heart failure) (HCC) 06/19/2016  . B12 deficiency 06/17/2016  . Metabolic encephalopathy 06/15/2016  . 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 Hyperlipidemia 06/14/2016  . Hypokalemia 06/14/2016  . UTI (urinary tract infection) 06/14/2016  . Unspecified dementia with behavioral disturbance 06/14/2016    Orientation RESPIRATION BLADDER Height & Weight     Self, Time  Normal Incontinent Weight:   Height:     BEHAVIORAL SYMPTOMS/MOOD NEUROLOGICAL BOWEL NUTRITION STATUS      Incontinent Diet (Regular)  AMBULATORY STATUS COMMUNICATION OF NEEDS Skin   Extensive Assist Verbally Other (Comment) (Cellulitis right/left leg)                       Personal Care Assistance Level of Assistance  Bathing, Feeding, Dressing Bathing Assistance: Maximum assistance Feeding assistance: Maximum assistance Dressing Assistance: Maximum assistance     Functional Limitations Info  Sight, Hearing, Speech  Sight Info: Adequate Hearing Info: Adequate Speech Info: Adequate    SPECIAL CARE FACTORS FREQUENCY  PT (By licensed PT), OT (By licensed OT)     PT Frequency: Evaluated 3/9 and a minimum of 3X per week therapy recommended OT Frequency: Evaluated 3/9 and a minimum of 2X per week therapy recommended            Contractures Contractures Info: Not present    Additional Factors Info  Code Status, Allergies Code Status Info: Full Allergies Info: Tetracyclines           Current Medications (12/29/2016):  This is the current hospital active medication list Current Facility-Administered Medications  Medication Dose Route Frequency Provider Last Rate Last Dose  .  stroke: mapping our early stages of recovery book   Does not apply Once Hillary Bow, DO      . acetaminophen (TYLENOL) tablet 650 mg  650 mg Oral Q4H PRN Hillary Bow, DO       Or  . acetaminophen (TYLENOL) solution 650 mg  650 mg Per Tube Q4H PRN Hillary Bow, DO       Or  . acetaminophen (TYLENOL) suppository 650 mg  650 mg Rectal Q4H PRN Hillary Bow, DO      . aspirin chewable tablet 81 mg  81 mg Oral Daily Hillary Bow, DO   81 mg at 12/29/16 1130  . atorvastatin (LIPITOR) tablet 80 mg  80 mg Oral QPM David L Rinehuls, PA-C   80 mg at 12/28/16 1654  . azithromycin (ZITHROMAX) 500 mg in dextrose 5 %  250 mL IVPB  500 mg Intravenous Q24H Hillary BowJared M Gardner, DO   500 mg at 12/28/16 2340  . clopidogrel (PLAVIX) tablet 75 mg  75 mg Oral Daily Hillary BowJared M Gardner, DO   75 mg at 12/29/16 1130  . enoxaparin (LOVENOX) injection 40 mg  40 mg Subcutaneous Q24H Hillary BowJared M Gardner, DO   40 mg at 12/29/16 1129  . vitamin B-12 (CYANOCOBALAMIN) tablet 1,000 mcg  1,000 mcg Oral Daily Marvel PlanJindong Xu, MD   1,000 mcg at 12/29/16 1129     Discharge Medications: Please see discharge summary for a list of discharge medications.  Relevant Imaging Results:  Relevant Lab Results:   Additional Information ss#[id number]  Cristobal GoldmannCrawford,  Olevia Westervelt Bradley, LCSW

## 2016-12-29 NOTE — Progress Notes (Signed)
STROKE TEAM PROGRESS NOTE   SUBJECTIVE (INTERVAL HISTORY) No family is at the bedside. She is lying in bed, no acute issue overnight. Pending TEE and loop.      OBJECTIVE Temp:  [97.9 F (36.6 C)-98.8 F (37.1 C)] 98.4 F (36.9 C) (03/12 0931) Pulse Rate:  [67-76] 68 (03/12 0931) Cardiac Rhythm: Normal sinus rhythm (03/12 0700) Resp:  [16-20] 18 (03/12 0931) BP: (132-163)/(62-100) 140/62 (03/12 0931) SpO2:  [98 %-100 %] 99 % (03/12 0931)  CBC:   Recent Labs Lab 12/25/16 1655  WBC 4.7  NEUTROABS 3.7  HGB 10.7*  HCT 34.2*  MCV 89.3  PLT 268    Basic Metabolic Panel:   Recent Labs Lab 12/25/16 1655  NA 141  K 3.8  CL 106  CO2 26  GLUCOSE 139*  BUN 17  CREATININE 0.90  CALCIUM 8.9    Lipid Panel:     Component Value Date/Time   CHOL 161 12/26/2016 0638   TRIG 77 12/26/2016 0638   HDL 45 12/26/2016 0638   CHOLHDL 3.6 12/26/2016 0638   VLDL 15 12/26/2016 0638   LDLCALC 101 (H) 12/26/2016 0638   HgbA1c:  Lab Results  Component Value Date   HGBA1C 6.3 (H) 12/26/2016   Urine Drug Screen:     Component Value Date/Time   LABOPIA NONE DETECTED 11/14/2016 1914   COCAINSCRNUR NONE DETECTED 11/14/2016 1914   LABBENZ NONE DETECTED 11/14/2016 1914   AMPHETMU NONE DETECTED 11/14/2016 1914   THCU NONE DETECTED 11/14/2016 1914   LABBARB NONE DETECTED 11/14/2016 1914      IMAGING I have personally reviewed the radiological images below and agree with the radiology interpretations.  Dg Chest 2 View 12/25/2016 1. Cardiomegaly with central vascular congestion  2. Tiny bilateral effusions. Mild lingular opacity may reflect atelectasis or a small infiltrate.   Ct Head Wo Contrast 12/25/2016 1. Suspected area of decreased attenuation/possible infarct in the right occipital lobe. No hemorrhage. Further evaluation with MRI is recommended.  2. Old appearing multifocal infarcts as described above.  3. Moderate-to-marked periventricular, subcortical and deep white  matter small vessel ischemic changes   Ct Angio Chest Pe W And/or Wo Contrast 12/25/2016 1. No evidence of pulmonary embolism.  2. Enlarged main pulmonary arteries bilaterally indicating longstanding pulmonary arterial hypertension.  3. Marked cardiomegaly. LAD and right coronary artery atherosclerosis.  4. Findings consistent with severe asthma and/or bronchitis. No confluent airspace pneumonia.  5. Moderate to large hiatal hernia.   Ct Cervical Spine Wo Contrast 12/25/2016 Straightening of the cervical spine. No acute fracture or malalignment.   Mr Brain Wo Contrast 12/25/2016 1. Extensive motion artifact.  2. Right PCA distribution acute infarction. Additional punctate acute infarcts within the right basal ganglia. No definite associated hemorrhage identified.  3. Background of advanced chronic microvascular ischemic changes, parenchymal volume loss, and small chronic cortical infarcts.   Mr Maxine Glenn Head/brain Wo Cm 12/26/2016 1. Abrupt proximal right P2 occlusion, compatible with acute right PCA territory infarct. Right PCA is of fetal type.  2. Severe distal left P2 stenosis, with probable distal left PCA occlusion, stable.  3. Severe stenosis/occlusion of the lateral branch of the right MCA, stable.  4. Mild atheromatous irregularity within the proximal basilar artery without flow-limiting stenosis, also stable.   LE venous doppler - There is no obvious evidence of DVT or SVT noted in the visualized veins of the bilateral lower extremities  CUS - Bilateral 1-39% ICA stenosis, antegrade vertebral flow.  Difficult exam due to tortuous and  pulsatile vessels.  TTE - Left ventricle: The cavity size was normal. There was mild   concentric hypertrophy. Systolic function was normal. The   estimated ejection fraction was in the range of 55% to 60%. Wall   motion was normal; there were no regional wall motion   abnormalities. Doppler parameters are consistent with abnormal   left ventricular  relaxation (grade 1 diastolic dysfunction).   Doppler parameters are consistent with high ventricular filling   pressure. - Aortic valve: Transvalvular velocity was within the normal range.   There was no stenosis. There was no regurgitation. - Mitral valve: Transvalvular velocity was within the normal range.   There was no evidence for stenosis. There was mild regurgitation. - Left atrium: The atrium was severely dilated. - Right ventricle: The cavity size was normal. Wall thickness was   normal. Systolic function was normal. - Tricuspid valve: There was mild regurgitation. - Pulmonary arteries: Systolic pressure was moderately increased.   PA peak pressure: 49 mm Hg (S).  TEE pending   PHYSICAL EXAM  Temp:  [97.9 F (36.6 C)-98.8 F (37.1 C)] 98.4 F (36.9 C) (03/12 0931) Pulse Rate:  [67-76] 68 (03/12 0931) Resp:  [16-20] 18 (03/12 0931) BP: (132-163)/(62-100) 140/62 (03/12 0931) SpO2:  [98 %-100 %] 99 % (03/12 0931)  General - Well nourished, well developed, in no apparent distress.  Ophthalmologic - Fundi not visualized due to eye movement.  Cardiovascular - Regular rate and rhythm.  Mental Status -  Level of arousal and orientation to month, place, and person were intact, but not to year. Language including expression, naming, repetition, comprehension was assessed and found intact.  Cranial Nerves II - XII - II - left homonymous hemianopia. III, IV, VI - Extraocular movements intact. V - Facial sensation intact bilaterally. VII - Facial movement intact bilaterally. VIII - Hearing & vestibular intact bilaterally. X - Palate elevates symmetrically. XI - Chin turning & shoulder shrug intact bilaterally. XII - Tongue protrusion intact.  Motor Strength - The patient's strength was normal in all extremities and pronator drift was absent.  Bulk was normal and fasciculations were absent.   Motor Tone - Muscle tone was assessed at the neck and appendages and was  normal.  Reflexes - The patient's reflexes were 1+ in all extremities and she had no pathological reflexes.  Sensory - Light touch, temperature/pinprick, vibration were assessed and were symmetrical.    Coordination - The patient had normal movements in the hands and feet with no ataxia or dysmetria.  Tremor was absent.  Gait and Station - deferred.   ASSESSMENT/PLAN [name] is a 83 y.o. female with history of a previous stroke, recent pneumonia, hypertension, coronary artery disease, and congestive heart failure presenting with confusion, left-sided neglect, cough, and hypoxia. She did not receive IV t-PA due to late presentation.  Strokes:  Right PCA distribution and right BG infarcts felt to be embolic from an unknown source.  Resultant  Left hemianopia  MRI - Right PCA distribution acute infarction. Additional punctate acute infarcts within the right basal ganglia.  MRA - right PCA occlusion, stable left PCA distal stenosis and b/l MCA branches asthero  Carotid Doppler - no significant stenosis  2D Echo - EF 55-60%. No cardiac source of emboli identified.  LE venous doppler no DVT  TEE and loop recorder to evaluate cardiac source of stroke  LDL - 101  HgbA1c - 6.3  VTE prophylaxis - Lovenox Diet NPO time specified Except for: [employer]  with Meds  clopidogrel 75 mg daily prior to admission, now on aspirin 81 mg daily and clopidogrel 75 mg daily. Continue DAPT on discharge.  Patient counseled to be compliant with her antithrombotic medications  Ongoing aggressive stroke risk factor management  Therapy recommendations:  SNF recommended  Disposition: - Pending  Hx of stroke  10/2005 MRI - old left MCA/PCA infarct  12/2006 MRI - old b/l MCA/PCA infarct  05/2016 MRI new old left MCA / frontal infarct - MRA left PCA distal stenosis and b/l MCA branch asthero  12/2016 MRI - right PCA infarct, right BG punctate infarct  Hypertension  Stable  Permissive  hypertension (OK if < 220/120) but gradually normalize in 5-7 days  Long-term BP goal normotensive  Hyperlipidemia  Home meds:  Lipitor 40 mg daily resumed in hospital  LDL 101, goal < 70  Increase Lipitor to 80 mg daily  Continue statin at discharge  Pre-Diabetes  HgbA1c 6.3, goal < 7.0  Controlled  Other Stroke Risk Factors  Advanced age  Former smoker  Coronary artery disease  Other Active Problems  Anemia - 10.7 / 34.2  Low B12 - 05/2016 261-> 329 and give supplement  High ammonia - 42 -> <9  TSH 4.589, slightly high  Hospital day # 4  Marvel Plan, MD PhD Stroke Neurology 12/29/2016 1:06 PM   To contact Stroke Continuity provider, please refer to [email or web address]. After hours, contact General Neurology

## 2016-12-29 NOTE — Clinical Social Work Note (Signed)
Clinical Social Work Assessment  Patient Details  Name: [name] MRN: [id number] Date of Birth: Feb 14, 1933  Date of referral:  12/29/16               Reason for consult:  Facility Placement                Permission sought to share information with:  Family Supports Permission granted to share information::  No (Patient was asleep)  Name::     Abran Richardoger Biegel  Agency::     Relationship::  Husband  Contact Information:  [phone]  Housing/Transportation Living arrangements for the past 2 months:  Skilled Nursing Facility, Single Family Home ([facility]) Source of Information:  Spouse Patient Interpreter Needed:  None Criminal Activity/Legal Involvement Pertinent to Current Situation/Hospitalization:  No - Comment as needed Significant Relationships:  None Lives with:  Spouse Do you feel safe going back to the place where you live?  No (Husband in agreement with ST rehab) Need for family participation in patient care:  Yes (Comment)  Care giving concerns:  Patient and husband live together. Mr. Janice NorrieLowery in agreement with ST rehab prior to patient returning home.  Social Worker assessment / plan:  CSW talked with spouse, Abran RichardRoger Kilner by phone regarding discharge disposition. Mr. Janice NorrieLowery expressed agreement with recommendation and informed CSW that [facility] and was admitted to hospital on 3/8. He [facility].  Employment status:  Retired Health and safety inspectornsurance information:  Teacher, early years/preManaged Medicare (UHC) PT Recommendations:  Skilled Nursing Facility Information / Referral to community resources:  Skilled Nursing Facility (Husband expressed facility preference)  Patient/Family's Response to care:  No concerns expressed by husband regarding patient's care during hospitalization.  Patient/Family's Understanding of and Emotional Response to Diagnosis, Current Treatment, and Prognosis:  Not  discussed.  Emotional Assessment Appearance:  Appears stated age (CSW looked in on patient, who was asleep) Attitude/Demeanor/Rapport:  Unable to Assess Affect (typically observed):  Unable to Assess Orientation:  Oriented to Self, Oriented to Place, Oriented to  Time, Oriented to Situation (Orientation to situation appears to fluctuate) Alcohol / Substance use:  Tobacco Use, Alcohol Use, Illicit Drugs (Patient quit smoking and does not drink or use drugs) Psych involvement (Current and /or in the community):  No (Comment)  Discharge Needs  Concerns to be addressed:  Discharge Planning Concerns Readmission within the last 30 days:  Yes Current discharge risk:  None Barriers to Discharge:  No Barriers Identified   Cristobal GoldmannCrawford, Jozeph Persing Bradley, LCSW 12/29/2016, 1:34 PM

## 2016-12-29 NOTE — Care Management Important Message (Signed)
Important Message  Patient Details  Name: Kendal HymenJuanita B Simoni MRN: [id number] Date of Birth: 1932/12/26   Medicare Important Message Given:  Yes    Kenetra Hildenbrand Stefan ChurchBratton 12/29/2016, 12:18 PM

## 2016-12-30 ENCOUNTER — Other Ambulatory Visit: Payer: Self-pay | Admitting: Nurse Practitioner

## 2016-12-30 ENCOUNTER — Encounter (HOSPITAL_COMMUNITY)
Admission: EM | Disposition: A | Discharge status: Skilled Nursing Facility | Payer: Self-pay | Source: Home / Self Care | Attending: Family Medicine

## 2016-12-30 ENCOUNTER — Other Ambulatory Visit: Payer: Self-pay | Admitting: Student

## 2016-12-30 ENCOUNTER — Inpatient Hospital Stay (HOSPITAL_COMMUNITY): Payer: Medicare Other

## 2016-12-30 ENCOUNTER — Encounter (HOSPITAL_COMMUNITY): Payer: Self-pay

## 2016-12-30 DIAGNOSIS — I639 Cerebral infarction, unspecified: Secondary | ICD-10-CM

## 2016-12-30 DIAGNOSIS — I340 Nonrheumatic mitral (valve) insufficiency: Secondary | ICD-10-CM

## 2016-12-30 DIAGNOSIS — R414 Neurologic neglect syndrome: Secondary | ICD-10-CM

## 2016-12-30 HISTORY — PX: TEE WITHOUT CARDIOVERSION: SHX5443

## 2016-12-30 SURGERY — ECHOCARDIOGRAM, TRANSESOPHAGEAL
Anesthesia: Moderate Sedation

## 2016-12-30 MED ORDER — MIDAZOLAM HCL 10 MG/2ML IJ SOLN
INTRAMUSCULAR | Status: DC | PRN
  Administered 2016-12-30: 2 mg via INTRAVENOUS

## 2016-12-30 MED ORDER — FENTANYL CITRATE (PF) 100 MCG/2ML IJ SOLN
INTRAMUSCULAR | Status: DC | PRN
  Administered 2016-12-30: 25 ug via INTRAVENOUS

## 2016-12-30 MED ORDER — FENTANYL CITRATE (PF) 100 MCG/2ML IJ SOLN
INTRAMUSCULAR | Status: AC
  Filled 2016-12-30: qty 2

## 2016-12-30 MED ORDER — ATORVASTATIN CALCIUM 80 MG PO TABS: 80.0000 mg | ORAL_TABLET | Freq: Every evening | ORAL | Status: AC

## 2016-12-30 MED ORDER — BUTAMBEN-TETRACAINE-BENZOCAINE 2-2-14 % EX AERO
INHALATION_SPRAY | CUTANEOUS | Status: DC | PRN
  Administered 2016-12-30: 2 via TOPICAL

## 2016-12-30 MED ORDER — ASPIRIN 81 MG PO CHEW: 81.0000 mg | CHEWABLE_TABLET | Freq: Every day | ORAL | Status: DC

## 2016-12-30 MED ORDER — SODIUM CHLORIDE 0.9 % IV SOLN
INTRAVENOUS | Status: DC
  Administered 2016-12-30: 500 mL via INTRAVENOUS

## 2016-12-30 MED ORDER — MIDAZOLAM HCL 5 MG/ML IJ SOLN
INTRAMUSCULAR | Status: AC
  Filled 2016-12-30: qty 2

## 2016-12-30 NOTE — Interval H&P Note (Signed)
History and Physical Interval Note:  12/30/2016 10:11 AM  [name]  has presented today for surgery, with the diagnosis of stroke  The various methods of treatment have been discussed with the patient and family. After consideration of risks, benefits and other options for treatment, the patient has consented to  Procedure(s): TRANSESOPHAGEAL ECHOCARDIOGRAM (TEE) (N/A) as a surgical intervention .  The patient's history has been reviewed, patient examined, no change in status, stable for surgery.  I have reviewed the patient's chart and labs.  Questions were answered to the patient's satisfaction.     [employer]oca ColaMark Weber Monnier

## 2016-12-30 NOTE — Progress Notes (Signed)
  Speech Language Pathology Treatment: Cognitive-Linquistic  Patient Details Name: [name] MRN: [id number] DOB: Mar 18, 1933 Today's Date: 12/30/2016 Time: [id number] SLP Time Calculation (min) (ACUTE ONLY): 16 min  Assessment / Plan / Recommendation Clinical Impression  Pt lying flat in bed, fully alert and pleasant.  Session focused on cognitive linguistic goals including awareness/attention.  Pt oriented to person, place, situation but not date.  She initially denied attention deficits - but following demonstration - pt admitted left attention changes after planned failure - expressing gratitude.    Pt reports premorbid love of reading magazines, etc.  Providing hand over hand assistance for reading large print, pt able to read a sentence with total cues during first attempt.  (Pt reports having reading glasses at home.) Further attempts completed with moderate verbal cues - anticipate carryover will require significant assistance secondary to pt's premorbid cognitive deficit.   Pt benefit from follow up SLP at next venue of care to maximize cognitive linguistic skills and decrease caregiver burden.     HPI HPI: Kristine LodgeJuanita B Loweryis a 83 y.o.femalewith medical history significant of dementia, prior stroke with no residual symptoms on aspirin and Plavix, hypertension, CAD, CHF on Lasix, and recent pneumonia who presents with a fall, confusion, left sided neglect, cough, and hypoxia. History was provided by both patient and her husband who was called. According to husband (who is legally blind), patient was last normal last night. He reports that this morning, patient spent several hours in the bathroom sitting. He says that when he checked on her, she was leaning to the right and turning towards the right constantly. He reports that this is new and different. He says that she had a unwitnessed fall but denied any head injury or headache. He reports that she has had a cough with some mucus  production. He reports that she does not use oxygen at baseline. She does report some generalized leg edema.  Patient denies any symptoms. She denies any headache, neck pain, chest pain, or abdominal pain. She denies any pain in her extremities. Denies any residual symptoms from her prior stroke.  MRI is showing right PCA infarct and punctate infarct in the right basal ganglia.  Pt has been seen for speech language evaluation.        SLP Plan  Continue with current plan of care       Recommendations   Progressing toward goals.                Follow up Recommendations: Home health SLP SLP Visit Diagnosis: Attention and concentration deficit Attention and concentration deficit following: Cerebral infarction Plan: Continue with current plan of care       GO                Donavan Burnetamara Helmuth Recupero, MS [facility] SLP [phone]

## 2016-12-30 NOTE — Discharge Summary (Signed)
Physician Discharge Summary  [name]:[id number] DOB: [birth year] DOA: 12/25/2016  PCP: Lolita Patella, MD  Admit date: 12/25/2016 Discharge date: 12/30/2016  Admitted From: Home Disposition: SNF  Recommendations for Outpatient Follow-up:  1. Follow up with PCP in 1 week 2. Follow up with neurology in 6 weeks 3. Follow up with cardiology for an event monitor 4. Aspirin and Plavix on discharge 5. Increased to Lipitor 80mg  daily 6. Recheck TSH as an outpatient in 4-6 weeks   Discharge Condition: Stable CODE STATUS: Full code Diet recommendation: Soft diet, thin liquid   Brief/Interim Summary:  Admission HPI written by Lyda Perone, MD   HPI: [name] is a 83 y.o. female with medical history significant of dementia, prior stroke with no residual symptoms on aspirin and Plavix, hypertension, CAD, CHF on Lasix, and recent pneumonia who presents with a fall, confusion, left sided neglect, cough, and hypoxia. History was provided by both patient and her husband who was called. According to husband (who is legally blind), patient was last normal last night. He reports that this morning, patient spent several hours in the bathroom sitting. He says that when he checked on her, she was leaning to the right and turning towards the right constantly. He reports that this is new and different. He says that she had a unwitnessed fall but denied any head injury or headache. He reports that she has had a cough with some mucus production. He reports that she does not use oxygen at baseline. She does report some generalized leg edema.  Patient denies any symptoms. She denies any headache, neck pain, chest pain, or abdominal pain. She denies any pain in her extremities. Denies any residual symptoms from her prior stroke.  ED Course: Patient with obvious L hemineglect, hypoxia improved by O2 via [place].  Stroke on MRI and bronchitis on CTA.    Hospital course:  Acute ischemic  stroke MRI significant for right PCA distribution infarct with additional punctate infarcts within right basal ganglia. Echo significant for EF of 55-60% with grade 1 diastolic dysfunction. TEE significant for no thrombus. Patient continued on aspirin and started on Plavix. Atorvastatin increased to 80 mg daily. Patient will need a 30-day event monitor at discharge. Communications were made with cardiology to set this up as an outpatient.  Acute bronchitis Resolved rather quickly. Received 5 days of azithromycin. Asymptomatic at discharge.  Chronic diastolic heart failure EF of 55-50% with grade 1 diastolic dysfunction. Euvolemic  Essential hypertension Held antihypertensives on admission to allow for permissive hypertension. Can restart antihypertensives to achieve goal blood pressures upon discharge.   Discharge Diagnoses:  Principal Problem:   Acute ischemic stroke [facility]) Active Problems:   Essential hypertension   Acute bronchitis   Chronic diastolic heart failure (HCC)   Left-sided visual neglect    Discharge Instructions  Discharge Instructions    Ambulatory referral to Neurology    Complete by:  As directed    Pt will follow up with Dr. Roda Shutters at [facility] in about 2-3 months. Thanks.   Call MD for:  difficulty breathing, headache or visual disturbances    Complete by:  As directed    Call MD for:  persistant dizziness or light-headedness    Complete by:  As directed    Call MD for:  temperature >100.4    Complete by:  As directed    Diet - low sodium heart healthy    Complete by:  As directed      Allergies as  of 12/30/2016      Reactions   Tetracyclines & Related Rash      Medication List    STOP taking these medications   ALPRAZolam 0.25 MG tablet Commonly known as:  XANAX   furosemide 40 MG tablet Commonly known as:  LASIX   losartan 50 MG tablet Commonly known as:  COZAAR   metoprolol tartrate 25 MG tablet Commonly known as:  LOPRESSOR   pantoprazole 40  MG tablet Commonly known as:  PROTONIX     TAKE these medications   aspirin 81 MG chewable tablet Chew 1 tablet (81 mg total) by mouth daily.   atorvastatin 80 MG tablet Commonly known as:  LIPITOR Take 1 tablet (80 mg total) by mouth every evening. What changed:  medication strength  how much to take   clopidogrel 75 MG tablet Commonly known as:  PLAVIX Take 75 mg by mouth daily.   cyanocobalamin 1000 MCG/ML injection Commonly known as:  (VITAMIN B-12) Inject 1 mL (1,000 mcg total) into the skin daily. Take 1000mcg SQ daily x 1 week, then 1000mcg sq weekly x 1 month, then 1000mcg  SQ monthly. What changed:  when to take this  additional instructions   donepezil 5 MG tablet Commonly known as:  ARICEPT Take 5 mg by mouth at bedtime.   QUEtiapine 25 MG tablet Commonly known as:  SEROQUEL Take 1 tablet (25 mg total) by mouth at bedtime.      Follow-up Information    Xu,Jindong, MD Follow up.   Specialty:  Neurology Contact information: [address] [place] [id number] [phone]          Allergies  Allergen Reactions  . Tetracyclines & Related Rash    Consultations:  Neurology  Cardiology   Procedures/Studies: Dg Chest 2 View  Result Date: 12/25/2016 CLINICAL DATA:  Left-sided neglect recent pneumonia with persistent cough EXAM: CHEST  2 VIEW COMPARISON:  11/14/2016, 06/15/2016 FINDINGS: Cardiomegaly with central vascular congestion. There are tiny bilateral effusions. There is a mild opacity at the lingula. No pneumothorax. Degenerative changes of the spine. Atherosclerotic calcifications of the aorta. IMPRESSION: 1. Cardiomegaly with central vascular congestion 2. Tiny bilateral effusions. Mild lingular opacity may reflect atelectasis or a small infiltrate. Electronically Signed   By: Jasmine PangKim  Fujinaga M.D.   On: 12/25/2016 17:39   Ct Head Wo Contrast  Result Date: 12/25/2016 CLINICAL DATA:  Left-sided neglect, extraocular muscles  not going to the left EXAM: CT HEAD WITHOUT CONTRAST CT CERVICAL SPINE WITHOUT CONTRAST TECHNIQUE: Multidetector CT imaging of the head and cervical spine was performed following the standard protocol without intravenous contrast. Multiplanar CT image reconstructions of the cervical spine were also generated. COMPARISON:  MRI 06/15/2016, CT brain 06/14/2016 FINDINGS: CT HEAD FINDINGS Brain: Old appearing infarcts/encephalomalacia within the bilateral frontal lobes. Old left occipital lobe infarct. Old right posterior parietal infarct. Low-attenuation with suspected loss of gray-white matter differentiation in the right occipital lobe, series 6, image number 33. No hemorrhage. No focal mass. No midline shift. Moderate atrophy. Moderate-to-marked periventricular, subcortical and deep white matter small vessel ischemic changes. Tiny old appearing lacunar infarct left thalamus. Vascular: No hyperdense vessels.  Carotid artery calcifications. Skull: No fracture.  No suspicious bone lesion. Sinuses/Orbits: Mucosal thickening in the sphenoid and ethmoid sinuses. No acute orbital abnormality. Bilateral lens extraction. Other: None CT CERVICAL SPINE FINDINGS Alignment: Straightening of the cervical spine. No subluxation. Facet alignment within normal limits. Skull base and vertebrae: Craniovertebral junction appears intact. The vertebral body  heights are normal. There is no fracture. Soft tissues and spinal canal: No prevertebral fluid or swelling. No visible canal hematoma. Disc levels: Multilevel degenerate disc changes. This is moderate and most notable at C5-C6, C6-C7 and C7-T1. Multilevel bilateral facet arthropathy. Upper chest: Biapical lung scarring. Tiny hypodense thyroid nodules. Carotid artery calcifications. Other: None IMPRESSION: 1. Suspected area of decreased attenuation/possible infarct in the right occipital lobe. No hemorrhage. Further evaluation with MRI is recommended. 2. Old appearing multifocal infarcts  as described above. 3. Moderate-to-marked periventricular, subcortical and deep white matter small vessel ischemic changes 4. Straightening of the cervical spine. No acute fracture or malalignment. Electronically Signed   By: Jasmine Pang M.D.   On: 12/25/2016 19:56   Ct Angio Chest Pe W And/or Wo Contrast  Result Date: 12/25/2016 CLINICAL DATA:  83 year old presenting with acute onset of cough, showed above evaluation to have hypoxia, tachycardia head elevated D-dimer. Former smoker. Current history of hypertension and coronary artery disease. EXAM: CT ANGIOGRAPHY CHEST WITH CONTRAST TECHNIQUE: Multidetector CT imaging of the chest was performed using the standard protocol during bolus administration of intravenous contrast. Multiplanar CT image reconstructions and MIPs were obtained to evaluate the vascular anatomy. CONTRAST:  150 mL Isovue 370 IV. COMPARISON:  None. FINDINGS: Cardiovascular: Contrast opacification of the pulmonary arteries is excellent. Respiratory motion blurs images throughout the examination. The study is of good diagnostic quality. No filling defects within either main pulmonary artery or their branches in either lung to suggest pulmonary embolism. Dilated right main pulmonary artery measuring up to 3.5 cm diameter. Mildly dilated left main pulmonary artery measuring up to 3.1 cm diameter. Heart markedly enlarged with left ventricular predominance and mild left ventricular hypertrophy. No pericardial effusion. Extensive LAD and mild right coronary artery atherosclerosis. The thoracic and upper abdominal aorta is not opacified. Moderate atherosclerosis without evidence of aneurysm. Mediastinum/Nodes: Moderate to large hiatal hernia. No pathologic lymphadenopathy. Visualized thyroid gland unremarkable. Lungs/Pleura: Scattered areas of hyperlucency throughout both lungs with other areas of relative ground-glass airspace opacity. Low lung volumes with atelectasis in the lower lobes. No  confluent airspace consolidation. No pulmonary parenchymal nodules or masses. Central airways patent with marked central peribronchial thickening. Bronchiectasis in the left upper lobe. No pleural effusions. Upper Abdomen: Diverticulosis involving the visualized splenic flexure the colon. Hiatal hernia as mentioned above. Otherwise unremarkable. Musculoskeletal: DISH involving the lower thoracic spine. Mild osseous demineralization. Review of the MIP images confirms the above findings. IMPRESSION: 1. No evidence of pulmonary embolism. 2. Enlarged main pulmonary arteries bilaterally indicating longstanding pulmonary arterial hypertension. 3. Marked cardiomegaly. LAD and right coronary artery atherosclerosis. 4. Findings consistent with severe asthma and/or bronchitis. No confluent airspace pneumonia. 5. Moderate to large hiatal hernia. Electronically Signed   By: Hulan Saas M.D.   On: 12/25/2016 21:50   Ct Cervical Spine Wo Contrast  Result Date: 12/25/2016 CLINICAL DATA:  Left-sided neglect, extraocular muscles not going to the left EXAM: CT HEAD WITHOUT CONTRAST CT CERVICAL SPINE WITHOUT CONTRAST TECHNIQUE: Multidetector CT imaging of the head and cervical spine was performed following the standard protocol without intravenous contrast. Multiplanar CT image reconstructions of the cervical spine were also generated. COMPARISON:  MRI 06/15/2016, CT brain 06/14/2016 FINDINGS: CT HEAD FINDINGS Brain: Old appearing infarcts/encephalomalacia within the bilateral frontal lobes. Old left occipital lobe infarct. Old right posterior parietal infarct. Low-attenuation with suspected loss of gray-white matter differentiation in the right occipital lobe, series 6, image number 33. No hemorrhage. No focal mass. No midline shift. Moderate atrophy.  Moderate-to-marked periventricular, subcortical and deep white matter small vessel ischemic changes. Tiny old appearing lacunar infarct left thalamus. Vascular: No hyperdense  vessels.  Carotid artery calcifications. Skull: No fracture.  No suspicious bone lesion. Sinuses/Orbits: Mucosal thickening in the sphenoid and ethmoid sinuses. No acute orbital abnormality. Bilateral lens extraction. Other: None CT CERVICAL SPINE FINDINGS Alignment: Straightening of the cervical spine. No subluxation. Facet alignment within normal limits. Skull base and vertebrae: Craniovertebral junction appears intact. The vertebral body heights are normal. There is no fracture. Soft tissues and spinal canal: No prevertebral fluid or swelling. No visible canal hematoma. Disc levels: Multilevel degenerate disc changes. This is moderate and most notable at C5-C6, C6-C7 and C7-T1. Multilevel bilateral facet arthropathy. Upper chest: Biapical lung scarring. Tiny hypodense thyroid nodules. Carotid artery calcifications. Other: None IMPRESSION: 1. Suspected area of decreased attenuation/possible infarct in the right occipital lobe. No hemorrhage. Further evaluation with MRI is recommended. 2. Old appearing multifocal infarcts as described above. 3. Moderate-to-marked periventricular, subcortical and deep white matter small vessel ischemic changes 4. Straightening of the cervical spine. No acute fracture or malalignment. Electronically Signed   By: Jasmine Pang M.D.   On: 12/25/2016 19:56   Mr Brain Wo Contrast  Result Date: 12/25/2016 CLINICAL DATA:  83 y/o  F; left-sided neglect. EXAM: MRI HEAD WITHOUT CONTRAST TECHNIQUE: Multiplanar, multiecho pulse sequences of the brain and surrounding structures were obtained without intravenous contrast. COMPARISON:  12/25/2016 CT of the head.  06/15/2016 MRI of the head. FINDINGS: Brain: Right posteromedial temporal, occipital, and hippocampus diffusion restriction compatible with acute right PCA territory infarction. There additional punctate foci of diffusion restriction within the right caudate head, right thalamus, and right globus pallidus also likely representing acute  ischemia. The region of infarction demonstrates mild T2 FLAIR hyperintense signal abnormality without significant mass effect. No hemorrhage identified. Background of advanced chronic microvascular ischemic changes and parenchymal volume loss of the brain. Chronic left occipital, and bilateral frontal small cortical infarctions. Vascular: Poorly visualized due to motion artifact. Skull and upper cervical spine: Normal marrow signal. Sinuses/Orbits: Negative. Other: None. IMPRESSION: 1. Extensive motion artifact. 2. Right PCA distribution acute infarction. Additional punctate acute infarcts within the right basal ganglia. No definite associated hemorrhage identified. 3. Background of advanced chronic microvascular ischemic changes, parenchymal volume loss, and small chronic cortical infarcts. These results will be called to the ordering clinician or representative by the Radiologist Assistant, and communication documented in the PACS or zVision Dashboard. Electronically Signed   By: Mitzi Hansen M.D.   On: 12/25/2016 22:28   Mr Maxine Glenn Head/brain ZO Cm  Result Date: 12/26/2016 CLINICAL DATA:  Initial evaluation for acute stroke. EXAM: MRA HEAD WITHOUT CONTRAST TECHNIQUE: Angiographic images of the Circle of Willis were obtained using MRA technique without intravenous contrast. COMPARISON:  Prior MRI from earlier the same day and MRA from 06/15/2016 FINDINGS: ANTERIOR CIRCULATION: Study is moderately degraded by motion artifact. Distal cervical segments of the internal carotid arteries are widely patent to the skullbase. Petrous, cavernous, supraclinoid segments patent bilaterally without flow-limiting stenosis. A1 segments patent bilaterally. Anterior communicating artery grossly normal. Anterior cerebral arteries patent to their distal aspects without obvious stenosis. M1 segments patent without stenosis or occlusion. Probable occlusion and/or severe stenosis of the lateral division of the right MCA again  noted (series 350, image 16), similar to previous. Distal MCA branches otherwise fairly symmetric. POSTERIOR CIRCULATION: Left vertebral artery dominant and patent to the vertebrobasilar junction. Right vertebral artery diffusely hypoplastic, and appears to terminate in  PICA. Basilar artery tortuous with mild irregularity and narrowing proximally. Basilar artery otherwise widely patent to its distal aspect. Superior cerebral artery is patent proximally, not well evaluated distally. Left PCA supplied via the basilar artery via a patent P1 segment. Small left posterior communicating artery noted as well. Superimposed severe distal left P2 stenosis (series 354, image 8), similar to prior. Left PCA attenuated distally, and may be occluded, similar. Fetal type right PCA supplied via the right posterior communicating artery. There is proximal right P2 occlusion (series 3, image 107), compatible with acute right PCA infarct. No aneurysm or vascular malformation. IMPRESSION: 1. Abrupt proximal right P2 occlusion, compatible with acute right PCA territory infarct. Right PCA is of fetal type. 2. Severe distal left P2 stenosis, with probable distal left PCA occlusion, stable. 3. Severe stenosis/occlusion of the lateral branch of the right MCA, stable. 4. Mild atheromatous irregularity within the proximal basilar artery without flow-limiting stenosis, also stable. Electronically Signed   By: Rise Mu M.D.   On: 12/26/2016 00:37     Transthoracic echocardiogram (12/26/2016)  Study Conclusions  - Left ventricle: The cavity size was normal. There was mild   concentric hypertrophy. Systolic function was normal. The   estimated ejection fraction was in the range of 55% to 60%. Wall   motion was normal; there were no regional wall motion   abnormalities. Doppler parameters are consistent with abnormal   left ventricular relaxation (grade 1 diastolic dysfunction).   Doppler parameters are consistent with high  ventricular filling   pressure. - Aortic valve: Transvalvular velocity was within the normal range.   There was no stenosis. There was no regurgitation. - Mitral valve: Transvalvular velocity was within the normal range.   There was no evidence for stenosis. There was mild regurgitation. - Left atrium: The atrium was severely dilated. - Right ventricle: The cavity size was normal. Wall thickness was   normal. Systolic function was normal. - Tricuspid valve: There was mild regurgitation. - Pulmonary arteries: Systolic pressure was moderately increased.   PA peak pressure: 49 mm Hg (S).  Transesophageal echocardiogram (12/30/2016)  Study Conclusions  - Left ventricle: Systolic function was normal. The estimated   ejection fraction was in the range of 55% to 60%. Wall motion was   normal; there were no regional wall motion abnormalities. - Aortic valve: Trileaflet; mildly thickened, mildly calcified   leaflets. - Mitral valve: There was mild to moderate regurgitation. - Left atrium: No evidence of thrombus in the atrial cavity or   appendage. - Right atrium: No evidence of thrombus in the atrial cavity or   appendage. - Atrial septum: No defect or patent foramen ovale was identified.  Impressions:  - No cardiac source of emboli was indentified.    Subjective: Patient reports no pain or dyspnea. No coughing or sneezing.  Discharge Exam: Vitals:   12/30/16 1132 12/30/16 1349  BP: (!) 138/56 (!) 154/57  Pulse: 68 60  Resp: (!) 22 20  Temp: 98 F (36.7 C) 98.6 F (37 C)   Vitals:   12/30/16 1040 12/30/16 1050 12/30/16 1132 12/30/16 1349  BP: (!) 129/43 132/70 (!) 138/56 (!) 154/57  Pulse: 74 60 68 60  Resp: 15 (!) 25 (!) 22 20  Temp: 98.6 F (37 C)  98 F (36.7 C) 98.6 F (37 C)  TempSrc: Oral  Oral Oral  SpO2: 96% 98% 98% (!) 59%    General exam: Appears calm and comfortable  Respiratory system: Clear to auscultation. Respiratory effort  normal. Cardiovascular system: S1 & S2 heard, RRR. No murmurs Gastrointestinal system: Abdomen is nondistended, soft and nontender. Normal bowel sounds heard. Central nervous system: Alert and oriented to person place and year. Neglects left visual field Extremities: No edema. No calf tenderness Skin: No cyanosis. No rashes Psychiatry: Judgement and insight appear normal. Mood & affect appropriate.   The results of significant diagnostics from this hospitalization (including imaging, microbiology, ancillary and laboratory) are listed below for reference.     Microbiology: No results found for this or any previous visit (from the past 240 hour(s)).   Labs: BNP (last 3 results)  Recent Labs  12/25/16 1655  BNP 198.4*   Basic Metabolic Panel:  Recent Labs Lab 12/25/16 1655  NA 141  K 3.8  CL 106  CO2 26  GLUCOSE 139*  BUN 17  CREATININE 0.90  CALCIUM 8.9   Liver Function Tests:  Recent Labs Lab 12/25/16 1655  AST 17  ALT 20  ALKPHOS 86  BILITOT 0.6  PROT 6.8  ALBUMIN 3.5   No results for input(s): LIPASE, AMYLASE in the last 168 hours.  Recent Labs Lab 12/25/16 1655 12/28/16 1739  AMMONIA 42* <9*   CBC:  Recent Labs Lab 12/25/16 1655  WBC 4.7  NEUTROABS 3.7  HGB 10.7*  HCT 34.2*  MCV 89.3  PLT 268   Cardiac Enzymes: No results for input(s): CKTOTAL, CKMB, CKMBINDEX, TROPONINI in the last 168 hours. BNP: Invalid input(s): POCBNP CBG: No results for input(s): GLUCAP in the last 168 hours. D-Dimer No results for input(s): DDIMER in the last 72 hours. Hgb A1c No results for input(s): HGBA1C in the last 72 hours. Lipid Profile No results for input(s): CHOL, HDL, LDLCALC, TRIG, CHOLHDL, LDLDIRECT in the last 72 hours. Thyroid function studies  Recent Labs  12/28/16 1739  TSH 4.589*   Anemia work up  Recent Labs  12/28/16 1739  VITAMINB12 329   Urinalysis    Component Value Date/Time   COLORURINE YELLOW 12/25/2016 1714    APPEARANCEUR HAZY (A) 12/25/2016 1714   LABSPEC 1.018 12/25/2016 1714   PHURINE 6.0 12/25/2016 1714   GLUCOSEU NEGATIVE 12/25/2016 1714   HGBUR NEGATIVE 12/25/2016 1714   BILIRUBINUR NEGATIVE 12/25/2016 1714   KETONESUR NEGATIVE 12/25/2016 1714   PROTEINUR NEGATIVE 12/25/2016 1714   UROBILINOGEN 1.0 09/26/2011 1954   NITRITE NEGATIVE 12/25/2016 1714   LEUKOCYTESUR NEGATIVE 12/25/2016 1714   Sepsis Labs Invalid input(s): PROCALCITONIN,  WBC,  LACTICIDVEN Microbiology No results found for this or any previous visit (from the past 240 hour(s)).   Time coordinating discharge: Over 30 minutes  SIGNED:   Jacquelin Hawking, MD [facility] 12/30/2016, 3:03 PM Pager [phone]  If 7PM-7AM, please contact night-coverage [email or web address] Password [email or web address]

## 2016-12-30 NOTE — Progress Notes (Signed)
OT Cancellation Note  Patient Details Name: [name] MRN: [id number] DOB: 1933/08/05   Cancelled Treatment:    Reason Eval/Treat Not Completed: Patient at procedure or test/ unavailable. Pt off unit for procedure at this time. Will check back as able.  Doristine Sectionharity A Marijah Larranaga, MS OTR/L  Pager: [phone]   [name] 12/30/2016, 9:15 AM

## 2016-12-30 NOTE — Discharge Instructions (Signed)
Aspiration Precautions, Adult Aspiration is the breathing in (inhalation) of a liquid or object into the lungs. Things that can be inhaled into the lungs include:  Food.  Any type of liquid, such as drinks or saliva.  Stomach contents, such as vomit or stomach acid. What are the signs of aspiration? Signs of aspiration include:  Coughing after swallowing food or liquids.  Clearing the throat often while eating.  Trouble breathing. This may include:  Breathing quickly.  Breathing very slowly.  Loud breathing.  Rumbling sounds from the lungs while breathing.  Coughing up phlegm (sputum) that:  Is yellow, tan, or green.  Has pieces of food in it.  Is bad-smelling.  Having a hoarse, barky cough.  Not being able to speak.  A hoarse voice.  Drooling while eating.  A feeling of fullness in the throat or a feeling that something is stuck in the throat.  Choking often.  Having a runny noise while eating.  Coughing when lying down or having to sit up quickly after lying down.  A change in skin color. The skin may look red or blue.  Fever.  Watery eyes.  Pain in the chest or back.  A pained look on the face. What are the complications of aspiration? Complications of aspiration include:  Losing weight because the person is not absorbing needed nutrients.  Loss of enjoyment and the social benefits of eating.  Choking.  Lung irritation, if someone aspirates acidic food or drinks.  Lung infection (pneumonia).  Collection of infected liquid (pus) in the lungs (lung abscess). In serious cases, death can occur. What can I do to prevent aspiration? Caring for someone who has a feeding tube  If you are caring for someone who has a feeding tube who cannot eat or drink safely through his or her mouth:  Keep the person in an upright position as much as possible.  Do not lay the person flat if he or she is getting continuous feedings. If you need to lay the  person flat for any reason, turn the feeding pump off.  Check feeding tube residuals as told by your health care provider. Ask your health care provider what residual amount is too high. Caring for someone who can eat and drink safely by mouth  If you are caring for someone who can eat and drink safely through his or her mouth:  Have the person sit in an upright position when eating food or drinking fluids. This can be done in two ways:  Have the person sit up in a chair.  If sitting in a chair is not possible, position the person in bed so he or she is upright.  Remind the person to eat slowly and chew well. Make sure the person is awake and alert while eating.  Do not distract the person. This is especially important for people with thinking or memory (cognitive) problems.  Allow foods to cool. Hot foods may be more difficult to swallow.  Provide small meals more frequently, instead of 3 large meals. This may reduce fatigue during eating.  Check the person's mouth thoroughly for leftover food after eating.  Keep the person sitting upright for 30-45 minutes after eating.  Do not serve food or drink during 2 hours or more before bedtime. General instructions  Follow these general guidelines to prevent aspiration in someone who can eat and drink safely by mouth:  Never put food or liquids in the mouth of a person who is not fully  alert.  Feed small amounts of food. Do not force feed.  For a person who is on a diet for swallowing difficulty (dysphagia diet), follow the recommended food and drink consistency. For example, in dysphagia diet level 1, thicken liquids to pudding-like consistency.  Use as little water as possible when brushing the person's teeth or cleaning his or her mouth.  Provide oral care before and after meals.  Use adaptive devices such as cut-out cups, straws, or utensils as told by the health care provider.  Crush pills and put them in soft food such as  pudding or ice cream. Some pills should not be crushed. Check with the health care provider before crushing any medicine. Contact a health care provider if:  The person has a feeding tube, and the feeding tube residual amount is too high.  The person has a fever.  The person tries to avoid food or water, such as refusing to eat, drink, or be fed, or is eating less than normal.  The person may have aspirated food or liquid.  You notice warning signs, such as choking or coughing, when the person eats or drinks. Get help right away if:  The person has trouble breathing or starts to breathe quickly.  The person is breathing very slowly or stops breathing.  The person coughs a lot after eating or drinking.  The person has a long-lasting (chronic) cough.  The person coughs up thick, yellow, or tan sputum.  If someone is choking on food or an object, perform the Heimlich maneuver (abdominal thrusts).  The person has symptoms of pneumonia, such as:  Coughing a lot.  Coughing up mucus with a bad smell or blood in it.  Feeling short of breath.  Complaining of chest pain.  Sweating, fever, and chills.  Feeling tired.  Complaining of trouble breathing.  Wheezing.  The person cannot stop choking.  The person is unable to breathe, turns blue, faints, or seems confused. These symptoms may represent a serious problem that is an emergency. Do not wait to see if the symptoms will go away. Get medical help right away. Call your local emergency services (911 in the [place]  Summary  Aspiration is the breathing in (inhalation) of a liquid or object into the lungs. Things that can be inhaled into the lungs include food, liquids, saliva, or stomach contents.  Aspiration can cause pneumonia or choking.  One sign of aspiration is coughing after swallowing food or liquids.  Contact a health care provider if you notice signs of aspiration. This information is not intended to replace  advice given to you by your health care provider. Make sure you discuss any questions you have with your health care provider. Document Released: 11/08/2010 Document Revised: 07/03/2016 Document Reviewed: 07/03/2016 Elsevier Interactive Patient Education  2017 [employer]  Cerebral Arteriosclerosis Cerebral arteriosclerosis is a condition that affects the arteries in your brain. When you have cerebral arteriosclerosis, these arteries become thicker, harder, and narrower than normal. Once this damage starts, a sticky substance (plaque) can build up inside your arteries and block blood flow. This reduces the normal blood flow to your brain. Blood carries oxygen to your brain. Since blood flow to your brain is reduced, your brain may not get the oxygen it needs. This process may start early in life and build up over time. Blood clots or plaques that rupture and break off may completely block blood flow and cause sudden and serious damage. Cerebral arteriosclerosis can cause serious problems such  as stroke or dementia. What increases the risk? Risk factors for developing cerebral arteriosclerosis include:  Being female.  Older age. The likelihood of this disorder increases with age.  Being Asian, African American, or Hispanic.  Having a family history of certain conditions, such as heart disease or stroke.  Having heart disease or a history of stroke. Lifestyle factors that can increase your risk of developing cerebral arteriosclerosis include:  Smoking or being exposed to secondhand smoke.  Having diabetes.  Being overweight.  Not getting enough exercise.  Having high blood pressure (hypertension).  Having high cholesterol.  Having a diet high in fat. What are the signs or symptoms? The most common symptoms of cerebral arteriosclerosis include:  Numbness or weakness in your arms or legs.  Speech problems.  Problems swallowing.  Vision  problems.  Confusion.  Irritability.  Personality changes, such as loss of interest, irritability, or confusion (vascular dementia).  Headache or facial pain. How is this diagnosed? Your health care provider may diagnose cerebral arteriosclerosis based on your symptoms and a physical exam. You may also have imaging studiesof your brain to confirm the diagnosis. These may include:  An imaging test using sound waves (ultrasound).  An imaging test after getting an injection of dye (angiogram). How is this treated? Treatment may include:  Medicine to control conditions that put you at risk of developing cerebral arteriosclerosis. These conditions may include:  High blood pressure.  High cholesterol.  Diabetes.  Taking medicine to thin your blood and prevent stroke.  Surgery. This may be:  Intracranial stent placement. This is a procedure to locate a blocked artery in the brain, widen the artery, and keep the artery open.  Surgery to bypass a blocked artery with a healthy artery taken from another part of the body. Follow these instructions at home:  Take medicine only as directed by your health care provider.  Do not use any tobacco products, including cigarettes, chewing tobacco, or electronic cigarettes. If you need help quitting, ask your health care provider.  Eat a low-fat, low-cholesterol, and low-sodium diet as directed by your health care provider.  Follow an exercise program approved by your health care provider.  Maintain a healthy weight. Lose weight if necessary.  Work with your health care provider to manage other health conditions, such as hypertension or diabetes.  Keep all follow-up visits as directed by your health care provider. This is important. Contact a health care provider if:  You have frequent headaches.  You have dizziness.  You or someone else notices changes in your personality.  You have facial pain.  You have periods of  confusion. Get help right away if:  You have a very bad headache.  You have weakness or numbness in your arms or legs.  You have droopiness of your face.  You have difficulty speaking.  You have difficulty swallowing.  You have trouble understanding what other people are saying.  You have sudden confusion.  You have sudden vision problems.  You have a seizure or loss of consciousness. Any of these symptoms may represent a serious problem that is an emergency. Do not wait to see if the symptoms will go away. Get medical help right away. Call your local emergency services (911 in [place] Do not drive yourself to the hospital. This information is not intended to replace advice given to you by your health care provider. Make sure you discuss any questions you have with your health care provider. Document Released: 09/26/2002 Document Revised: 03/13/2016  Document Reviewed: 11/24/2013 Elsevier Interactive Patient Education  2017 [employer].

## 2016-12-30 NOTE — Progress Notes (Signed)
Patient back to unit.  Alert, oriented to person, place and time.  No noted signs of discomfort or distress.

## 2016-12-30 NOTE — H&P (View-Only) (Signed)
ELECTROPHYSIOLOGY CONSULT NOTE  Patient ID: [name] MRN: [id number], DOB/AGE: 1932/10/31   Admit date: 12/25/2016 Date of Consult: 12/30/2016  Primary Physician: Lolita PatellaEADE,ROBERT ALEXANDER, MD Primary Cardiologist: Hochrein (remotely) Reason for Consultation: Cryptogenic stroke; recommendations regarding Implantable Loop Recorder  History of Present Illness [name] was admitted on 12/25/2016 with confusion, left sided neglect, and cough. Imaging demonstrated right PCA distribution felt to be embolic 2/2 unknown source.  She has undergone workup for stroke including echocardiogram and carotid dopplers.  The patient has been monitored on telemetry which has demonstrated sinus rhythm with no arrhythmias.  Inpatient stroke work-up is to be completed with a TEE.   Echocardiogram this admission demonstrated EF 55-60%, no RWMA, LA 46.  Lab work is reviewed.  Prior to admission, the patient has had palpitations but denies chest pain, shortness of breath, dizziness, or syncope.  They are recovering from their stroke with plans to go to SNF at discharge.  EP has been asked to evaluate for placement of an implantable loop recorder to monitor for atrial fibrillation.  Past Medical History:  Diagnosis Date  . CHF (congestive heart failure) (HCC)   . Coronary artery disease   . Hypertension   . Stroke (cerebrum) (HCC) 06/14/2016     Surgical History: History reviewed. No pertinent surgical history.   Prescriptions Prior to Admission  Medication Sig Dispense Refill Last Dose  . atorvastatin (LIPITOR) 40 MG tablet Take 50 mg by mouth every evening.    Past Week at Unknown time  . clopidogrel (PLAVIX) 75 MG tablet Take 75 mg by mouth daily.     12/25/2016 at AM  . donepezil (ARICEPT) 5 MG tablet Take 5 mg by mouth at bedtime.   Past Week at Unknown time  . furosemide (LASIX) 40 MG tablet Take 1 tablet (40 mg total) by mouth daily. 30 tablet  12/25/2016 at Unknown time  . losartan (COZAAR)  50 MG tablet Take 50 mg by mouth daily.     12/25/2016 at Unknown time  . metoprolol tartrate (LOPRESSOR) 25 MG tablet Take 25 mg by mouth 2 (two) times daily.     12/25/2016 at AM  . pantoprazole (PROTONIX) 40 MG tablet Take 40 mg by mouth daily.   12/25/2016 at Unknown time  . QUEtiapine (SEROQUEL) 25 MG tablet Take 1 tablet (25 mg total) by mouth at bedtime. 30 tablet 0 Past Week at Unknown time  . ALPRAZolam (XANAX) 0.25 MG tablet Take 1 tablet (0.25 mg total) by mouth 2 (two) times daily as needed for anxiety or sleep (May be used on call to MRI suite.). (Patient not taking: Reported on 11/14/2016) 15 tablet 0 Not Taking at Unknown time  . cyanocobalamin (,VITAMIN B-12,) 1000 MCG/ML injection Inject 1 mL (1,000 mcg total) into the skin daily. Take 1000mcg SQ daily x 1 week, then 1000mcg sq weekly x 1 month, then 1000mcg  SQ monthly. (Patient taking differently: Inject 1,000 mcg into the skin every 30 (thirty) days. ) 25 mL 0 Not started yet at Unknown    Inpatient Medications:  . [MAR Hold]  stroke: mapping our early stages of recovery book   Does not apply Once  . [MAR Hold] aspirin  81 mg Oral Daily  . [MAR Hold] atorvastatin  80 mg Oral QPM  . [MAR Hold] azithromycin  500 mg Intravenous Q24H  . [MAR Hold] clopidogrel  75 mg Oral Daily  . [MAR Hold] enoxaparin (LOVENOX) injection  40 mg Subcutaneous Q24H  . [  MAR Hold] vitamin B-12  1,000 mcg Oral Daily    Allergies:  Allergies  Allergen Reactions  . Tetracyclines & Related Rash    Social History   Social History  . Marital status: Married    Spouse name: N/A  . Number of children: N/A  . Years of education: N/A   Occupational History  . Not on file.   Social History Main Topics  . Smoking status: Former Games developer  . Smokeless tobacco: Never Used  . Alcohol use No  . Drug use: No  . Sexual activity: Yes    Birth control/ protection: Post-menopausal   Other Topics Concern  . Not on file   Social History Narrative  . No  narrative on file     Family History  Problem Relation Age of Onset  . Heart disease Mother       Review of Systems: All other systems reviewed and are otherwise negative except as noted above.  Physical Exam: Vitals:   12/30/16 0100 12/30/16 0514 12/30/16 0910  BP: 122/85 (!) 146/69 (!) 170/52  Pulse: 77 69 69  Resp: 18 18 14   Temp: 98.7 F (37.1 C) 98.2 F (36.8 C) 98.5 F (36.9 C)  TempSrc: Oral Oral Oral  SpO2: 100% 98% 98%    GEN- The patient is elderly appearing, alert and oriented x2 today  Head- normocephalic, atraumatic Eyes-  Sclera clear, conjunctiva pink Ears- hearing intact Oropharynx- clear Neck- supple Lungs- Clear to ausculation bilaterally, normal work of breathing Heart- Regular rate and rhythm  GI- soft, NT, ND, + BS Extremities- no clubbing, cyanosis, or edema MS- no significant deformity or atrophy Skin- no rash or lesion Psych- euthymic mood, full affect   Labs:   Lab Results  Component Value Date   WBC 4.7 12/25/2016   HGB 10.7 (L) 12/25/2016   HCT 34.2 (L) 12/25/2016   MCV 89.3 12/25/2016   PLT 268 12/25/2016    Recent Labs Lab 12/25/16 1655  NA 141  K 3.8  CL 106  CO2 26  BUN 17  CREATININE 0.90  CALCIUM 8.9  PROT 6.8  BILITOT 0.6  ALKPHOS 86  ALT 20  AST 17  GLUCOSE 139*     Radiology/Studies: Dg Chest 2 View Result Date: 12/25/2016 CLINICAL DATA:  Left-sided neglect recent pneumonia with persistent cough EXAM: CHEST  2 VIEW COMPARISON:  11/14/2016, 06/15/2016 FINDINGS: Cardiomegaly with central vascular congestion. There are tiny bilateral effusions. There is a mild opacity at the lingula. No pneumothorax. Degenerative changes of the spine. Atherosclerotic calcifications of the aorta. IMPRESSION: 1. Cardiomegaly with central vascular congestion 2. Tiny bilateral effusions. Mild lingular opacity may reflect atelectasis or a small infiltrate. Electronically Signed   By: Jasmine Pang M.D.   On: 12/25/2016 17:39   Ct Head  Wo Contrast Result Date: 12/25/2016 CLINICAL DATA:  Left-sided neglect, extraocular muscles not going to the left EXAM: CT HEAD WITHOUT CONTRAST CT CERVICAL SPINE WITHOUT CONTRAST TECHNIQUE: Multidetector CT imaging of the head and cervical spine was performed following the standard protocol without intravenous contrast. Multiplanar CT image reconstructions of the cervical spine were also generated. COMPARISON:  MRI 06/15/2016, CT brain 06/14/2016 FINDINGS: CT HEAD FINDINGS Brain: Old appearing infarcts/encephalomalacia within the bilateral frontal lobes. Old left occipital lobe infarct. Old right posterior parietal infarct. Low-attenuation with suspected loss of gray-white matter differentiation in the right occipital lobe, series 6, image number 33. No hemorrhage. No focal mass. No midline shift. Moderate atrophy. Moderate-to-marked periventricular, subcortical and deep white  matter small vessel ischemic changes. Tiny old appearing lacunar infarct left thalamus. Vascular: No hyperdense vessels.  Carotid artery calcifications. Skull: No fracture.  No suspicious bone lesion. Sinuses/Orbits: Mucosal thickening in the sphenoid and ethmoid sinuses. No acute orbital abnormality. Bilateral lens extraction. Other: None CT CERVICAL SPINE FINDINGS Alignment: Straightening of the cervical spine. No subluxation. Facet alignment within normal limits. Skull base and vertebrae: Craniovertebral junction appears intact. The vertebral body heights are normal. There is no fracture. Soft tissues and spinal canal: No prevertebral fluid or swelling. No visible canal hematoma. Disc levels: Multilevel degenerate disc changes. This is moderate and most notable at C5-C6, C6-C7 and C7-T1. Multilevel bilateral facet arthropathy. Upper chest: Biapical lung scarring. Tiny hypodense thyroid nodules. Carotid artery calcifications. Other: None IMPRESSION: 1. Suspected area of decreased attenuation/possible infarct in the right occipital lobe. No  hemorrhage. Further evaluation with MRI is recommended. 2. Old appearing multifocal infarcts as described above. 3. Moderate-to-marked periventricular, subcortical and deep white matter small vessel ischemic changes 4. Straightening of the cervical spine. No acute fracture or malalignment. Electronically Signed   By: Jasmine Pang M.D.   On: 12/25/2016 19:56   12-lead ECG sinus rhythm All prior EKG's in EPIC reviewed with no documented atrial fibrillation  Telemetry sinus rhythm with PVC's - personally reviewed   Assessment and Plan:  1. Cryptogenic stroke The patient presents with cryptogenic stroke.  The patient has a TEE planned for this AM. With ongoing slight confusion and palpitations at home, I think that a 30 day monitor is a reasonable first step. I worry about her ability to participate in our remote monitoring program at home.  We will arrange 30 day monitor  Please call with questions.   Kristine Balsam, NP 12/30/2016 9:28 AM  I have seen, examined the patient, and reviewed the above assessment and plan.  On exam, elderly female, NAD.  Confused.  Changes to above are made where necessary.  She is a poor candidate for our ILR protocol.   I would advise 30 day event monitor at discharge rather than ILR.  Follow-up with neurology as an outpatient.  Electrophysiology team to see as needed while here. Please call with questions.  Co Sign: Hillis Range, MD 12/30/2016 10:00 AM

## 2016-12-30 NOTE — Progress Notes (Signed)
Patient able to state name, date of birth, place and time.  Patient able to verbalize name of exam, reason why procedure needs to be done and name of MD doing procedure.  Consent form obtained and signed.

## 2016-12-30 NOTE — Progress Notes (Signed)
  Echocardiogram Echocardiogram Transesophageal has been performed.  Tye SavoyCasey N Allizon Woznick 12/30/2016, 10:57 AM

## 2016-12-30 NOTE — Consult Note (Signed)
ELECTROPHYSIOLOGY CONSULT NOTE  Patient ID: [name] MRN: [id number], DOB/AGE: 1932/10/31   Admit date: 12/25/2016 Date of Consult: 12/30/2016  Primary Physician: Lolita PatellaEADE,ROBERT ALEXANDER, MD Primary Cardiologist: Hochrein (remotely) Reason for Consultation: Cryptogenic stroke; recommendations regarding Implantable Loop Recorder  History of Present Illness [name] was admitted on 12/25/2016 with confusion, left sided neglect, and cough. Imaging demonstrated right PCA distribution felt to be embolic 2/2 unknown source.  She has undergone workup for stroke including echocardiogram and carotid dopplers.  The patient has been monitored on telemetry which has demonstrated sinus rhythm with no arrhythmias.  Inpatient stroke work-up is to be completed with a TEE.   Echocardiogram this admission demonstrated EF 55-60%, no RWMA, LA 46.  Lab work is reviewed.  Prior to admission, the patient has had palpitations but denies chest pain, shortness of breath, dizziness, or syncope.  They are recovering from their stroke with plans to go to SNF at discharge.  EP has been asked to evaluate for placement of an implantable loop recorder to monitor for atrial fibrillation.  Past Medical History:  Diagnosis Date  . CHF (congestive heart failure) (HCC)   . Coronary artery disease   . Hypertension   . Stroke (cerebrum) (HCC) 06/14/2016     Surgical History: History reviewed. No pertinent surgical history.   Prescriptions Prior to Admission  Medication Sig Dispense Refill Last Dose  . atorvastatin (LIPITOR) 40 MG tablet Take 50 mg by mouth every evening.    Past Week at Unknown time  . clopidogrel (PLAVIX) 75 MG tablet Take 75 mg by mouth daily.     12/25/2016 at AM  . donepezil (ARICEPT) 5 MG tablet Take 5 mg by mouth at bedtime.   Past Week at Unknown time  . furosemide (LASIX) 40 MG tablet Take 1 tablet (40 mg total) by mouth daily. 30 tablet  12/25/2016 at Unknown time  . losartan (COZAAR)  50 MG tablet Take 50 mg by mouth daily.     12/25/2016 at Unknown time  . metoprolol tartrate (LOPRESSOR) 25 MG tablet Take 25 mg by mouth 2 (two) times daily.     12/25/2016 at AM  . pantoprazole (PROTONIX) 40 MG tablet Take 40 mg by mouth daily.   12/25/2016 at Unknown time  . QUEtiapine (SEROQUEL) 25 MG tablet Take 1 tablet (25 mg total) by mouth at bedtime. 30 tablet 0 Past Week at Unknown time  . ALPRAZolam (XANAX) 0.25 MG tablet Take 1 tablet (0.25 mg total) by mouth 2 (two) times daily as needed for anxiety or sleep (May be used on call to MRI suite.). (Patient not taking: Reported on 11/14/2016) 15 tablet 0 Not Taking at Unknown time  . cyanocobalamin (,VITAMIN B-12,) 1000 MCG/ML injection Inject 1 mL (1,000 mcg total) into the skin daily. Take 1000mcg SQ daily x 1 week, then 1000mcg sq weekly x 1 month, then 1000mcg  SQ monthly. (Patient taking differently: Inject 1,000 mcg into the skin every 30 (thirty) days. ) 25 mL 0 Not started yet at Unknown    Inpatient Medications:  . [MAR Hold]  stroke: mapping our early stages of recovery book   Does not apply Once  . [MAR Hold] aspirin  81 mg Oral Daily  . [MAR Hold] atorvastatin  80 mg Oral QPM  . [MAR Hold] azithromycin  500 mg Intravenous Q24H  . [MAR Hold] clopidogrel  75 mg Oral Daily  . [MAR Hold] enoxaparin (LOVENOX) injection  40 mg Subcutaneous Q24H  . [  MAR Hold] vitamin B-12  1,000 mcg Oral Daily    Allergies:  Allergies  Allergen Reactions  . Tetracyclines & Related Rash    Social History   Social History  . Marital status: Married    Spouse name: N/A  . Number of children: N/A  . Years of education: N/A   Occupational History  . Not on file.   Social History Main Topics  . Smoking status: Former Games developer  . Smokeless tobacco: Never Used  . Alcohol use No  . Drug use: No  . Sexual activity: Yes    Birth control/ protection: Post-menopausal   Other Topics Concern  . Not on file   Social History Narrative  . No  narrative on file     Family History  Problem Relation Age of Onset  . Heart disease Mother       Review of Systems: All other systems reviewed and are otherwise negative except as noted above.  Physical Exam: Vitals:   12/30/16 0100 12/30/16 0514 12/30/16 0910  BP: 122/85 (!) 146/69 (!) 170/52  Pulse: 77 69 69  Resp: 18 18 14   Temp: 98.7 F (37.1 C) 98.2 F (36.8 C) 98.5 F (36.9 C)  TempSrc: Oral Oral Oral  SpO2: 100% 98% 98%    GEN- The patient is elderly appearing, alert and oriented x2 today  Head- normocephalic, atraumatic Eyes-  Sclera clear, conjunctiva pink Ears- hearing intact Oropharynx- clear Neck- supple Lungs- Clear to ausculation bilaterally, normal work of breathing Heart- Regular rate and rhythm  GI- soft, NT, ND, + BS Extremities- no clubbing, cyanosis, or edema MS- no significant deformity or atrophy Skin- no rash or lesion Psych- euthymic mood, full affect   Labs:   Lab Results  Component Value Date   WBC 4.7 12/25/2016   HGB 10.7 (L) 12/25/2016   HCT 34.2 (L) 12/25/2016   MCV 89.3 12/25/2016   PLT 268 12/25/2016    Recent Labs Lab 12/25/16 1655  NA 141  K 3.8  CL 106  CO2 26  BUN 17  CREATININE 0.90  CALCIUM 8.9  PROT 6.8  BILITOT 0.6  ALKPHOS 86  ALT 20  AST 17  GLUCOSE 139*     Radiology/Studies: Dg Chest 2 View Result Date: 12/25/2016 CLINICAL DATA:  Left-sided neglect recent pneumonia with persistent cough EXAM: CHEST  2 VIEW COMPARISON:  11/14/2016, 06/15/2016 FINDINGS: Cardiomegaly with central vascular congestion. There are tiny bilateral effusions. There is a mild opacity at the lingula. No pneumothorax. Degenerative changes of the spine. Atherosclerotic calcifications of the aorta. IMPRESSION: 1. Cardiomegaly with central vascular congestion 2. Tiny bilateral effusions. Mild lingular opacity may reflect atelectasis or a small infiltrate. Electronically Signed   By: Jasmine Pang M.D.   On: 12/25/2016 17:39   Ct Head  Wo Contrast Result Date: 12/25/2016 CLINICAL DATA:  Left-sided neglect, extraocular muscles not going to the left EXAM: CT HEAD WITHOUT CONTRAST CT CERVICAL SPINE WITHOUT CONTRAST TECHNIQUE: Multidetector CT imaging of the head and cervical spine was performed following the standard protocol without intravenous contrast. Multiplanar CT image reconstructions of the cervical spine were also generated. COMPARISON:  MRI 06/15/2016, CT brain 06/14/2016 FINDINGS: CT HEAD FINDINGS Brain: Old appearing infarcts/encephalomalacia within the bilateral frontal lobes. Old left occipital lobe infarct. Old right posterior parietal infarct. Low-attenuation with suspected loss of gray-white matter differentiation in the right occipital lobe, series 6, image number 33. No hemorrhage. No focal mass. No midline shift. Moderate atrophy. Moderate-to-marked periventricular, subcortical and deep white  matter small vessel ischemic changes. Tiny old appearing lacunar infarct left thalamus. Vascular: No hyperdense vessels.  Carotid artery calcifications. Skull: No fracture.  No suspicious bone lesion. Sinuses/Orbits: Mucosal thickening in the sphenoid and ethmoid sinuses. No acute orbital abnormality. Bilateral lens extraction. Other: None CT CERVICAL SPINE FINDINGS Alignment: Straightening of the cervical spine. No subluxation. Facet alignment within normal limits. Skull base and vertebrae: Craniovertebral junction appears intact. The vertebral body heights are normal. There is no fracture. Soft tissues and spinal canal: No prevertebral fluid or swelling. No visible canal hematoma. Disc levels: Multilevel degenerate disc changes. This is moderate and most notable at C5-C6, C6-C7 and C7-T1. Multilevel bilateral facet arthropathy. Upper chest: Biapical lung scarring. Tiny hypodense thyroid nodules. Carotid artery calcifications. Other: None IMPRESSION: 1. Suspected area of decreased attenuation/possible infarct in the right occipital lobe. No  hemorrhage. Further evaluation with MRI is recommended. 2. Old appearing multifocal infarcts as described above. 3. Moderate-to-marked periventricular, subcortical and deep white matter small vessel ischemic changes 4. Straightening of the cervical spine. No acute fracture or malalignment. Electronically Signed   By: Jasmine Pang M.D.   On: 12/25/2016 19:56   12-lead ECG sinus rhythm All prior EKG's in EPIC reviewed with no documented atrial fibrillation  Telemetry sinus rhythm with PVC's - personally reviewed   Assessment and Plan:  1. Cryptogenic stroke The patient presents with cryptogenic stroke.  The patient has a TEE planned for this AM. With ongoing slight confusion and palpitations at home, I think that a 30 day monitor is a reasonable first step. I worry about her ability to participate in our remote monitoring program at home.  We will arrange 30 day monitor  Please call with questions.   Gypsy Balsam, NP 12/30/2016 9:28 AM  I have seen, examined the patient, and reviewed the above assessment and plan.  On exam, elderly female, NAD.  Confused.  Changes to above are made where necessary.  She is a poor candidate for our ILR protocol.   I would advise 30 day event monitor at discharge rather than ILR.  Follow-up with neurology as an outpatient.  Electrophysiology team to see as needed while here. Please call with questions.  Co Sign: Hillis Range, MD 12/30/2016 10:00 AM

## 2016-12-30 NOTE — Progress Notes (Signed)
STROKE TEAM PROGRESS NOTE   SUBJECTIVE (INTERVAL HISTORY) No family is at the bedside. Had TEE unremarkable. Cardiology recommend 30 day cardiac event monitoring over loop recorder.      OBJECTIVE Temp:  [98 F (36.7 C)-99.6 F (37.6 C)] 98 F (36.7 C) (03/13 1132) Pulse Rate:  [60-93] 68 (03/13 1132) Cardiac Rhythm: Other (Comment) (03/12 2000) Resp:  [10-25] 22 (03/13 1132) BP: (115-194)/(43-103) 138/56 (03/13 1132) SpO2:  [96 %-100 %] 98 % (03/13 1132)  CBC:   Recent Labs Lab 12/25/16 1655  WBC 4.7  NEUTROABS 3.7  HGB 10.7*  HCT 34.2*  MCV 89.3  PLT 268    Basic Metabolic Panel:   Recent Labs Lab 12/25/16 1655  NA 141  K 3.8  CL 106  CO2 26  GLUCOSE 139*  BUN 17  CREATININE 0.90  CALCIUM 8.9    Lipid Panel:     Component Value Date/Time   CHOL 161 12/26/2016 0638   TRIG 77 12/26/2016 0638   HDL 45 12/26/2016 0638   CHOLHDL 3.6 12/26/2016 0638   VLDL 15 12/26/2016 0638   LDLCALC 101 (H) 12/26/2016 0638   HgbA1c:  Lab Results  Component Value Date   HGBA1C 6.3 (H) 12/26/2016   Urine Drug Screen:     Component Value Date/Time   LABOPIA NONE DETECTED 11/14/2016 1914   COCAINSCRNUR NONE DETECTED 11/14/2016 1914   LABBENZ NONE DETECTED 11/14/2016 1914   AMPHETMU NONE DETECTED 11/14/2016 1914   THCU NONE DETECTED 11/14/2016 1914   LABBARB NONE DETECTED 11/14/2016 1914      IMAGING I have personally reviewed the radiological images below and agree with the radiology interpretations.  Dg Chest 2 View 12/25/2016 1. Cardiomegaly with central vascular congestion  2. Tiny bilateral effusions. Mild lingular opacity may reflect atelectasis or a small infiltrate.   Ct Head Wo Contrast 12/25/2016 1. Suspected area of decreased attenuation/possible infarct in the right occipital lobe. No hemorrhage. Further evaluation with MRI is recommended.  2. Old appearing multifocal infarcts as described above.  3. Moderate-to-marked periventricular, subcortical  and deep white matter small vessel ischemic changes   Ct Angio Chest Pe W And/or Wo Contrast 12/25/2016 1. No evidence of pulmonary embolism.  2. Enlarged main pulmonary arteries bilaterally indicating longstanding pulmonary arterial hypertension.  3. Marked cardiomegaly. LAD and right coronary artery atherosclerosis.  4. Findings consistent with severe asthma and/or bronchitis. No confluent airspace pneumonia.  5. Moderate to large hiatal hernia.   Ct Cervical Spine Wo Contrast 12/25/2016 Straightening of the cervical spine. No acute fracture or malalignment.   Mr Brain Wo Contrast 12/25/2016 1. Extensive motion artifact.  2. Right PCA distribution acute infarction. Additional punctate acute infarcts within the right basal ganglia. No definite associated hemorrhage identified.  3. Background of advanced chronic microvascular ischemic changes, parenchymal volume loss, and small chronic cortical infarcts.   Mr Maxine Glenn Head/brain Wo Cm 12/26/2016 1. Abrupt proximal right P2 occlusion, compatible with acute right PCA territory infarct. Right PCA is of fetal type.  2. Severe distal left P2 stenosis, with probable distal left PCA occlusion, stable.  3. Severe stenosis/occlusion of the lateral branch of the right MCA, stable.  4. Mild atheromatous irregularity within the proximal basilar artery without flow-limiting stenosis, also stable.   LE venous doppler - There is no obvious evidence of DVT or SVT noted in the visualized veins of the bilateral lower extremities  CUS - Bilateral 1-39% ICA stenosis, antegrade vertebral flow.  Difficult exam due to tortuous and pulsatile  vessels.  TTE - Left ventricle: The cavity size was normal. There was mild   concentric hypertrophy. Systolic function was normal. The   estimated ejection fraction was in the range of 55% to 60%. Wall   motion was normal; there were no regional wall motion   abnormalities. Doppler parameters are consistent with abnormal   left  ventricular relaxation (grade 1 diastolic dysfunction).   Doppler parameters are consistent with high ventricular filling   pressure. - Aortic valve: Transvalvular velocity was within the normal range.   There was no stenosis. There was no regurgitation. - Mitral valve: Transvalvular velocity was within the normal range.   There was no evidence for stenosis. There was mild regurgitation. - Left atrium: The atrium was severely dilated. - Right ventricle: The cavity size was normal. Wall thickness was   normal. Systolic function was normal. - Tricuspid valve: There was mild regurgitation. - Pulmonary arteries: Systolic pressure was moderately increased.   PA peak pressure: 49 mm Hg (S).  TEE - No embolic source identified. Normal ejection fraction. Mild to moderate mitral regurgitation. Negative bubble study.   PHYSICAL EXAM  Temp:  [98 F (36.7 C)-99.6 F (37.6 C)] 98 F (36.7 C) (03/13 1132) Pulse Rate:  [60-93] 68 (03/13 1132) Resp:  [10-25] 22 (03/13 1132) BP: (115-194)/(43-103) 138/56 (03/13 1132) SpO2:  [96 %-100 %] 98 % (03/13 1132)  General - Well nourished, well developed, in no apparent distress.  Ophthalmologic - Fundi not visualized due to eye movement.  Cardiovascular - Regular rate and rhythm.  Mental Status -  Level of arousal and orientation to month, place, and person were intact, but not to year. Language including expression, naming, repetition, comprehension was assessed and found intact.  Cranial Nerves II - XII - II - left homonymous hemianopia. III, IV, VI - Extraocular movements intact. V - Facial sensation intact bilaterally. VII - Facial movement intact bilaterally. VIII - Hearing & vestibular intact bilaterally. X - Palate elevates symmetrically. XI - Chin turning & shoulder shrug intact bilaterally. XII - Tongue protrusion intact.  Motor Strength - The patient's strength was normal in all extremities and pronator drift was absent.  Bulk was  normal and fasciculations were absent.   Motor Tone - Muscle tone was assessed at the neck and appendages and was normal.  Reflexes - The patient's reflexes were 1+ in all extremities and she had no pathological reflexes.  Sensory - Light touch, temperature/pinprick, vibration were assessed and were symmetrical.    Coordination - The patient had normal movements in the hands and feet with no ataxia or dysmetria.  Tremor was absent.  Gait and Station - deferred.   ASSESSMENT/PLAN Ms. GLENDORA CLOUATRE is a 83 y.o. female with history of a previous stroke, recent pneumonia, hypertension, coronary artery disease, and congestive heart failure presenting with confusion, left-sided neglect, cough, and hypoxia. She did not receive IV t-PA due to late presentation.  Strokes:  Right PCA distribution and right BG infarcts felt to be embolic from an unknown source.  Resultant  Left hemianopia  MRI - Right PCA distribution acute infarction. Additional punctate acute infarcts within the right basal ganglia.  MRA - right PCA occlusion, stable left PCA distal stenosis and b/l MCA branches asthero  Carotid Doppler - no significant stenosis  2D Echo - EF 55-60%. No cardiac source of emboli identified.  LE venous doppler no DVT  TEE unremarkable  Cardiology EP recommend 30 day cardiac event monitoring over loop recorder. Agree with  30 day event monitoring as first step  LDL - 101  HgbA1c - 6.3  VTE prophylaxis - Lovenox DIET SOFT Room service appropriate? Yes; Fluid consistency: Thin  clopidogrel 75 mg daily prior to admission, now on aspirin 81 mg daily and clopidogrel 75 mg daily. Continue DAPT on discharge.  Patient counseled to be compliant with her antithrombotic medications  Ongoing aggressive stroke risk factor management  Therapy recommendations:  SNF recommended  Disposition: - Pending  Hx of strokes  10/2005 MRI - old left MCA/PCA infarct  12/2006 MRI - old b/l MCA/PCA  infarct  05/2016 MRI - new old left MCA / frontal infarct - MRA left PCA distal stenosis and b/l MCA branch asthero  12/2016 MRI - right PCA infarct, right BG punctate infarct  Hypertension  Stable  Permissive hypertension (OK if < 220/120) but gradually normalize in 5-7 days  Long-term BP goal normotensive  Hyperlipidemia  Home meds:  Lipitor 40 mg daily resumed in hospital  LDL 101, goal < 70  Increase Lipitor to 80 mg daily  Continue statin at discharge  Pre-Diabetes  HgbA1c 6.3, goal < 7.0  Controlled  Other Stroke Risk Factors  Advanced age  Former smoker  Coronary artery disease  Other Active Problems  Anemia - 10.7 / 34.2  Low B12 - 05/2016 261-> 329 and on B12 supplement  High ammonia - 42 -> <9  TSH 4.589, slightly high  Hospital day # 5  Neurology will sign off. Please call with questions. [facility]. Thanks for the consult.   Marvel PlanJindong Maxwell Lemen, MD PhD Stroke Neurology 12/30/2016 12:38 PM   To contact Stroke Continuity provider, please refer to [email or web address]. After hours, contact General Neurology

## 2016-12-30 NOTE — Progress Notes (Signed)
[facility] arrived to transport patient. 

## 2016-12-30 NOTE — Progress Notes (Signed)
[facility] [place] [place]  Put on hold, then disconnected.

## 2016-12-30 NOTE — CV Procedure (Signed)
Transesophageal echocardiogram  Indications: Stroke  Technique: Timeout performed. Lidocaine used for topical anesthesia of oropharynx. Versed and fentanyl, 2 mg and 25mg  (see nursing chart for full details). Sedation time was 20 minutes. I was present for full sedation time. Moderate sedation. She tolerated the procedure well without any difficulties.  Findings: No embolic source identified. Normal ejection fraction. Mild to moderate mitral regurgitation. Negative bubble study.  Donato SchultzMark Myron Stankovich, MD

## 2016-12-31 ENCOUNTER — Encounter (HOSPITAL_COMMUNITY): Payer: Self-pay | Admitting: Cardiology

## 2017-02-06 ENCOUNTER — Telehealth: Payer: Self-pay | Admitting: Nurse Practitioner

## 2017-02-06 NOTE — Telephone Encounter (Signed)
Amber, I called patient spoke with Husband he said pt was in a rehab facility with no discharge date at this time.  He advised pt would not be able to come to appt her to get the monitor. Can we remove the monitor order from the workque?   Lamar Laundry

## 2017-03-10 ENCOUNTER — Ambulatory Visit: Payer: Self-pay | Admitting: Diagnostic Neuroimaging

## 2017-03-30 ENCOUNTER — Ambulatory Visit (INDEPENDENT_AMBULATORY_CARE_PROVIDER_SITE_OTHER): Payer: Medicare Other | Admitting: Diagnostic Neuroimaging

## 2017-03-30 ENCOUNTER — Ambulatory Visit (INDEPENDENT_AMBULATORY_CARE_PROVIDER_SITE_OTHER): Payer: Medicare Other

## 2017-03-30 ENCOUNTER — Encounter (INDEPENDENT_AMBULATORY_CARE_PROVIDER_SITE_OTHER): Payer: Self-pay | Admitting: Orthopaedic Surgery

## 2017-03-30 ENCOUNTER — Ambulatory Visit (INDEPENDENT_AMBULATORY_CARE_PROVIDER_SITE_OTHER): Payer: Medicare Other | Admitting: Orthopaedic Surgery

## 2017-03-30 ENCOUNTER — Encounter: Payer: Self-pay | Admitting: Diagnostic Neuroimaging

## 2017-03-30 VITALS — BP 117/62 | HR 90

## 2017-03-30 DIAGNOSIS — F0391 Unspecified dementia with behavioral disturbance: Secondary | ICD-10-CM | POA: Diagnosis not present

## 2017-03-30 DIAGNOSIS — M25512 Pain in left shoulder: Secondary | ICD-10-CM

## 2017-03-30 DIAGNOSIS — I6930 Unspecified sequelae of cerebral infarction: Secondary | ICD-10-CM

## 2017-03-30 DIAGNOSIS — I825Y2 Chronic embolism and thrombosis of unspecified deep veins of left proximal lower extremity: Secondary | ICD-10-CM

## 2017-03-30 DIAGNOSIS — F03B18 Unspecified dementia, moderate, with other behavioral disturbance: Secondary | ICD-10-CM

## 2017-03-30 NOTE — Patient Instructions (Signed)
  STROKE PREVENTION  - continue eliquis (started recently for left leg DVT; also can help with stroke prevention; would continue life long at this point) - of to stop plavix now (because patient is now on anti-coagulation for DVT) - continue statin  MODERATE DEMENTIA  - continue aricept, seroquel, zoloft - consider palliative care consult

## 2017-03-30 NOTE — Progress Notes (Signed)
[facility]  PATIENT: [name] DOB: [birth year]  REFERRING CLINICIAN: Jacquelin HawkingNettey, Ralph HISTORY FROM: patient  REASON FOR VISIT: new consult    HISTORICAL  CHIEF COMPLAINT:  Chief Complaint  Patient presents with  . Cerebrovascular Accident    rm 7, New Pt, husband -Fredrik CoveRoger, sonThereasa Distance- Rodney, [facility]   . Follow-up    hospital stroke FU., Dr Roda ShuttersXu    HISTORY OF PRESENT ILLNESS:   83 year old female here for evaluation of hospital stroke discharge. Patient was admitted to hospital in March 2018 after patient had fallen down, had confusion, left-sided neglect. Patient has history of dementia, hypertension, coronary artery disease, CHF. Patient had stroke workup completed. [facility].  Since that time patient has had continued problems with vision on the left side, had dropped, decreased appetite. Patient's son and husband have not noticed significant change or improvement. They have questions regarding diagnosis and prognosis. Patient feels comfortable today. She denies any problems or pain.  Of note patient has had gradual onset progressive short-term memory loss, decreased functional ability since 2017.    REVIEW OF SYSTEMS: Full 14 system review of systems performed and negative with exception of: Only as per history of present illness.  ALLERGIES: Allergies  Allergen Reactions  . Tetracyclines & Related Rash    HOME MEDICATIONS: Outpatient Medications Prior to Visit  Medication Sig Dispense Refill  . aspirin 81 MG chewable tablet Chew 1 tablet (81 mg total) by mouth daily.    Marland Kitchen. atorvastatin (LIPITOR) 80 MG tablet Take 1 tablet (80 mg total) by mouth every evening.    . clopidogrel (PLAVIX) 75 MG tablet Take 75 mg by mouth daily.      . cyanocobalamin (,VITAMIN B-12,) 1000 MCG/ML injection Inject 1 mL (1,000 mcg total) into the skin daily. Take 1000mcg SQ daily x 1 week, then 1000mcg sq weekly x  1 month, then 1000mcg  SQ monthly. (Patient taking differently: Inject 1,000 mcg into the skin every 30 (thirty) days. ) 25 mL 0  . donepezil (ARICEPT) 5 MG tablet Take 5 mg by mouth at bedtime.    Marland Kitchen. QUEtiapine (SEROQUEL) 25 MG tablet Take 1 tablet (25 mg total) by mouth at bedtime. 30 tablet 0   No facility-administered medications prior to visit.     PAST MEDICAL HISTORY: Past Medical History:  Diagnosis Date  . CHF (congestive heart failure) (HCC)   . Coronary artery disease   . Hypertension   . Stroke (cerebrum) (HCC) 06/14/2016, 12/2016   x 3    PAST SURGICAL HISTORY: Past Surgical History:  Procedure Laterality Date  . cataract surgery    . ROTATOR CUFF REPAIR Left   . TEE WITHOUT CARDIOVERSION N/A 12/30/2016   Procedure: TRANSESOPHAGEAL ECHOCARDIOGRAM (TEE);  Surgeon: Jake BatheMark C Skains, MD;  [facility];  Service: Cardiovascular;  Laterality: N/A;  . TOTAL HIP ARTHROPLASTY Bilateral     FAMILY HISTORY: Family History  Problem Relation Age of Onset  . Heart disease Mother     SOCIAL HISTORY:  Social History   Social History  . Marital status: Married    Spouse name: N/A  . Number of children: N/A  . Years of education: N/A   Occupational History  . Not on file.   Social History Main Topics  . Smoking status: Former Games developermoker  . Smokeless tobacco: Never Used  . Alcohol use No  . Drug use: No  . Sexual activity: Yes  Birth control/ protection: Post-menopausal   Other Topics Concern  . Not on file   Social History Narrative  . No narrative on file     PHYSICAL EXAM  GENERAL EXAM/CONSTITUTIONAL: Vitals:  Vitals:   03/30/17 1506  BP: 117/62  Pulse: 90     There is no height or weight on file to calculate BMI.  No exam data present  Patient is in no distress; well developed, nourished and groomed; neck is supple  CARDIOVASCULAR:  Examination of carotid arteries is normal; no carotid bruits  Regular rate and rhythm, no  murmurs  Examination of peripheral vascular system by observation and palpation is normal  LEFT LEG SWOLLEN  EYES:  Ophthalmoscopic exam of optic discs and posterior segments is normal; no papilledema or hemorrhages  MUSCULOSKELETAL:  Gait, strength, tone, movements noted in Neurologic exam below  NEUROLOGIC: MENTAL STATUS:  MMSE - Mini Mental State Exam 03/30/2017  Orientation to time 0  Orientation to Place 2  Registration 3  Attention/ Calculation 0  Recall 2  Language- name 2 objects 2  Language- repeat 1  Language- follow 3 step command 1  Language- read & follow direction 0  Write a sentence 0  Copy design 0  Total score 11    awake, alert, oriented to person; place and time --> MONTH ?, DATE ?, YEAR = 2008; [place], [place]  DECR memory   DECR attention and concentration  DECR FLUENCY; DECR COMPREHENSION; naming intact,   fund of knowledge appropriate  HEAD TURNED TO THE RIGHT  HEAD SLIGHTLY DROPPED  LEFT NEGLECT  CRANIAL NERVE:   2nd - no papilledema on fundoscopic exam  2nd, 3rd, 4th, 6th - pupils equal and reactive to light, visual fields --> LEFT HOMONYMOUS HEMIANOPSIA; LEFT NEGLECT, LIMITED LEFT GAZE; extraocular muscles intact, no nystagmus  5th - facial sensation symmetric  7th - facial strength symmetric  8th - hearing intact  9th - palate elevates symmetrically, uvula midline  11th - shoulder shrug symmetric  12th - tongue protrusion midline  MOTOR:   normal bulk and tone INE RUE; LUE INCREASED TONE  RUE 4, LUE 3  RLE 3, LLE 2  SENSORY:   normal and symmetric to light touch  DECR IN LEFT SIDE  PATIENT THINKS HER LEFT HAND IS HER HUSBAND'S HAND  COORDINATION:   finger-nose-finger, fine finger movements normal  REFLEXES:   deep tendon reflexes TRACE and symmetric  EXCEPT BRISK IN LEFT ARM  GAIT/STATION:   IN WHEEL CHAIR    DIAGNOSTIC DATA (LABS, IMAGING, TESTING) - I reviewed patient records,  labs, notes, testing and imaging myself where available.  Lab Results  Component Value Date   WBC 4.7 12/25/2016   HGB 10.7 (L) 12/25/2016   HCT 34.2 (L) 12/25/2016   MCV 89.3 12/25/2016   PLT 268 12/25/2016      Component Value Date/Time   NA 141 12/25/2016 1655   K 3.8 12/25/2016 1655   CL 106 12/25/2016 1655   CO2 26 12/25/2016 1655   GLUCOSE 139 (H) 12/25/2016 1655   BUN 17 12/25/2016 1655   CREATININE 0.90 12/25/2016 1655   CALCIUM 8.9 12/25/2016 1655   PROT 6.8 12/25/2016 1655   ALBUMIN 3.5 12/25/2016 1655   AST 17 12/25/2016 1655   ALT 20 12/25/2016 1655   ALKPHOS 86 12/25/2016 1655   BILITOT 0.6 12/25/2016 1655   GFRNONAA 58 (L) 12/25/2016 1655   GFRAA >60 12/25/2016 1655   Lab Results  Component Value Date  CHOL 161 12/26/2016   HDL 45 12/26/2016   LDLCALC 101 (H) 12/26/2016   TRIG 77 12/26/2016   CHOLHDL 3.6 12/26/2016   Lab Results  Component Value Date   HGBA1C 6.3 (H) 12/26/2016   Lab Results  Component Value Date   VITAMINB12 329 12/28/2016   Lab Results  Component Value Date   TSH 4.589 (H) 12/28/2016    12/25/16 MRI brain [I reviewed images myself and agree with interpretation. -VRP]  1. Extensive motion artifact. 2. Right PCA distribution acute infarction. Additional punctate acute infarcts within the right basal ganglia. No definite associated hemorrhage identified. 3. Background of advanced chronic microvascular ischemic changes, parenchymal volume loss, and small chronic cortical infarcts.   12/25/16 MRA head [I reviewed images myself and agree with interpretation. -VRP]  1. Abrupt proximal right P2 occlusion, compatible with acute right PCA territory infarct. Right PCA is of fetal type. 2. Severe distal left P2 stenosis, with probable distal left PCA occlusion, stable. 3. Severe stenosis/occlusion of the lateral branch of the right MCA, stable. 4. Mild atheromatous irregularity within the proximal basilar artery without flow-limiting  stenosis, also stable.  12/30/16 TEE - Left ventricle: Systolic function was normal. The estimated   ejection fraction was in the range of 55% to 60%. Wall motion was   normal; there were no regional wall motion abnormalities. - Aortic valve: Trileaflet; mildly thickened, mildly calcified   leaflets. - Mitral valve: There was mild to moderate regurgitation. - Left atrium: No evidence of thrombus in the atrial cavity or   appendage. - Right atrium: No evidence of thrombus in the atrial cavity or   appendage. - Atrial septum: No defect or patent foramen ovale was identified. - No cardiac source of emboli was indentified.     ASSESSMENT AND PLAN  83 y.o. year old female here with right PCA ischemic infarction in March 2018, also with dementia. Patient stable, but unfortunately not showing much signs of improvement since hospital discharge. This is likely due to patient's underlying moderate dementia.   Dx:  1. Chronic ischemic right PCA stroke   2. Moderate dementia with behavioral disturbance   3. Chronic deep vein thrombosis (DVT) of proximal vein of left lower extremity (HCC)      PLAN:  STROKE PREVENTION (new problem, no additional workup) - continue eliquis (started recently for left leg DVT; also can help with stroke prevention; would continue life long at this point) - of to stop plavix now (because patient is now on anti-coagulation for DVT) - continue statin  MODERATE DEMENTIA (new problem, no additional workup) - continue aricept, seroquel, zoloft - consider palliative care consult  Return if symptoms worsen or fail to improve, for return to PCP.    Suanne Marker, MD 03/30/2017, 3:31 PM Certified in Neurology, Neurophysiology and Neuroimaging  [facility] [address] [place], [place] [address] [phone]

## 2017-03-30 NOTE — Progress Notes (Signed)
Office Visit Note   Patient: [name]           Date of Birth: [birth year]           MRN: [id number] Visit Date: 03/30/2017              Requested by: Elias Elseeade, Robert, MD [phone] [address] [place], [place] [address] PCP: Elias Elseeade, Robert, MD   Assessment & Plan: Visit Diagnoses:  1. Acute pain of left shoulder     Plan: Impression is left shoulder osteoarthritis. I recommended over-the-counter NSAIDs as needed at her facility. I'll set her up with intra-articular steroid injection with Dr. Alvester MorinNewton. Follow-up with me as needed.  Follow-Up Instructions: Return if symptoms worsen or fail to improve.   Orders:  Orders Placed This Encounter  Procedures  . XR Shoulder Left   No orders of the defined types were placed in this encounter.     Procedures: No procedures performed   Clinical Data: No additional findings.   Subjective: Chief Complaint  Patient presents with  . Left Shoulder - Pain    Patient is a 83 year old female who lives permanently at a skilled nursing facility comes in with left shoulder pain for about a week or so. She is a poor historian and does not provide much details for the history of present illness. She denies any injuries.    Review of Systems  Constitutional: Negative.   HENT: Negative.   Eyes: Negative.   Respiratory: Negative.   Cardiovascular: Negative.   Endocrine: Negative.   Musculoskeletal: Negative.   Neurological: Negative.   Hematological: Negative.   Psychiatric/Behavioral: Negative.   All other systems reviewed and are negative.    Objective: Vital Signs: There were no vitals taken for this visit.  Physical Exam  Constitutional: She is oriented to person, place, and time. She appears well-developed and well-nourished.  HENT:  Head: Normocephalic and atraumatic.  Eyes: EOM are normal.  Neck: Neck supple.  Pulmonary/Chest: Effort normal.  Abdominal: Soft.  Neurological: She is alert and oriented to  person, place, and time.  Skin: Skin is warm. Capillary refill takes less than 2 seconds.  Psychiatric: She has a normal mood and affect. Her behavior is normal. Judgment and thought content normal.  Nursing note and vitals reviewed.   Ortho Exam Left shoulder exam shows limited range of motion secondary to pain and guarding. The arm is neurovascularly intact. Specialty Comments:  No specialty comments available.  Imaging: Xr Shoulder Left  Result Date: 03/30/2017 osteoarthritis    PMFS History: Patient Active Problem List   Diagnosis Date Noted  . Left-sided visual neglect 12/30/2016  . Chronic diastolic heart failure (HCC) 12/29/2016  . Acute bronchitis 12/26/2016  . Acute ischemic stroke (HCC) 12/25/2016  . Benign hypertensive heart disease without heart failure 06/19/2016  . Chronic CHF (congestive heart failure) (HCC) 06/19/2016  . B12 deficiency 06/17/2016  . Metabolic encephalopathy 06/15/2016  . 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 Essential hypertension 06/14/2016  . Hyperlipidemia 06/14/2016  . Hypokalemia 06/14/2016  . UTI (urinary tract infection) 06/14/2016  . Unspecified dementia with behavioral disturbance 06/14/2016   Past Medical History:  Diagnosis Date  . CHF (congestive heart failure) (HCC)   . Coronary artery disease   . Hypertension   . Stroke (cerebrum) (HCC) 06/14/2016    Family History  Problem Relation Age of Onset  . Heart disease Mother     Past Surgical History:  Procedure Laterality Date  . TEE WITHOUT  CARDIOVERSION N/A 12/30/2016   Procedure: TRANSESOPHAGEAL ECHOCARDIOGRAM (TEE);  Surgeon: Jake Bathe, MD;  Location: [facility] ENDOSCOPY;  Service: Cardiovascular;  Laterality: N/A;   Social History   Occupational History  . Not on file.   Social History Main Topics  . Smoking status: Former Games developer  . Smokeless tobacco: Never Used  . Alcohol use No  . Drug use: No  . Sexual activity: Yes    Birth control/ protection:  Post-menopausal

## 2017-03-30 NOTE — Addendum Note (Signed)
Addended by: Albertina ParrGARCIA, Raia Amico on: 03/30/2017 09:07 AM   Modules accepted: Orders

## 2017-03-31 ENCOUNTER — Other Ambulatory Visit: Payer: Self-pay | Admitting: Nurse Practitioner

## 2017-03-31 ENCOUNTER — Ambulatory Visit (INDEPENDENT_AMBULATORY_CARE_PROVIDER_SITE_OTHER): Payer: Medicare Other

## 2017-03-31 DIAGNOSIS — I4891 Unspecified atrial fibrillation: Secondary | ICD-10-CM

## 2017-03-31 DIAGNOSIS — I639 Cerebral infarction, unspecified: Secondary | ICD-10-CM

## 2017-04-13 ENCOUNTER — Encounter (INDEPENDENT_AMBULATORY_CARE_PROVIDER_SITE_OTHER): Payer: Self-pay

## 2017-04-30 ENCOUNTER — Encounter: Payer: Self-pay | Admitting: *Deleted

## 2017-04-30 NOTE — Progress Notes (Signed)
Patient ID: [name], female   DOB: 1933/07/21, 83 y.o.   MRN: [id number] Cardiac event monitor returned to practice with note. 04/17/17 Hospice care, DC 30 holter/cardiac, return monitor to clinic. V.O. Dr. Donette LarryHusain.   Monitor returned to [employer] via [employer] using monitors prepaid packaging,  tracking# [id number].

## 2017-08-20 DEATH — deceased

## 2017-08-24 IMAGING — CR DG CHEST 2V
2 series · 2 of 2 positions shown · non-contrast
Comparison: 06/15/2016

CLINICAL DATA: Medical clearance

EXAM:
CHEST  2 VIEW

[w chest lat]
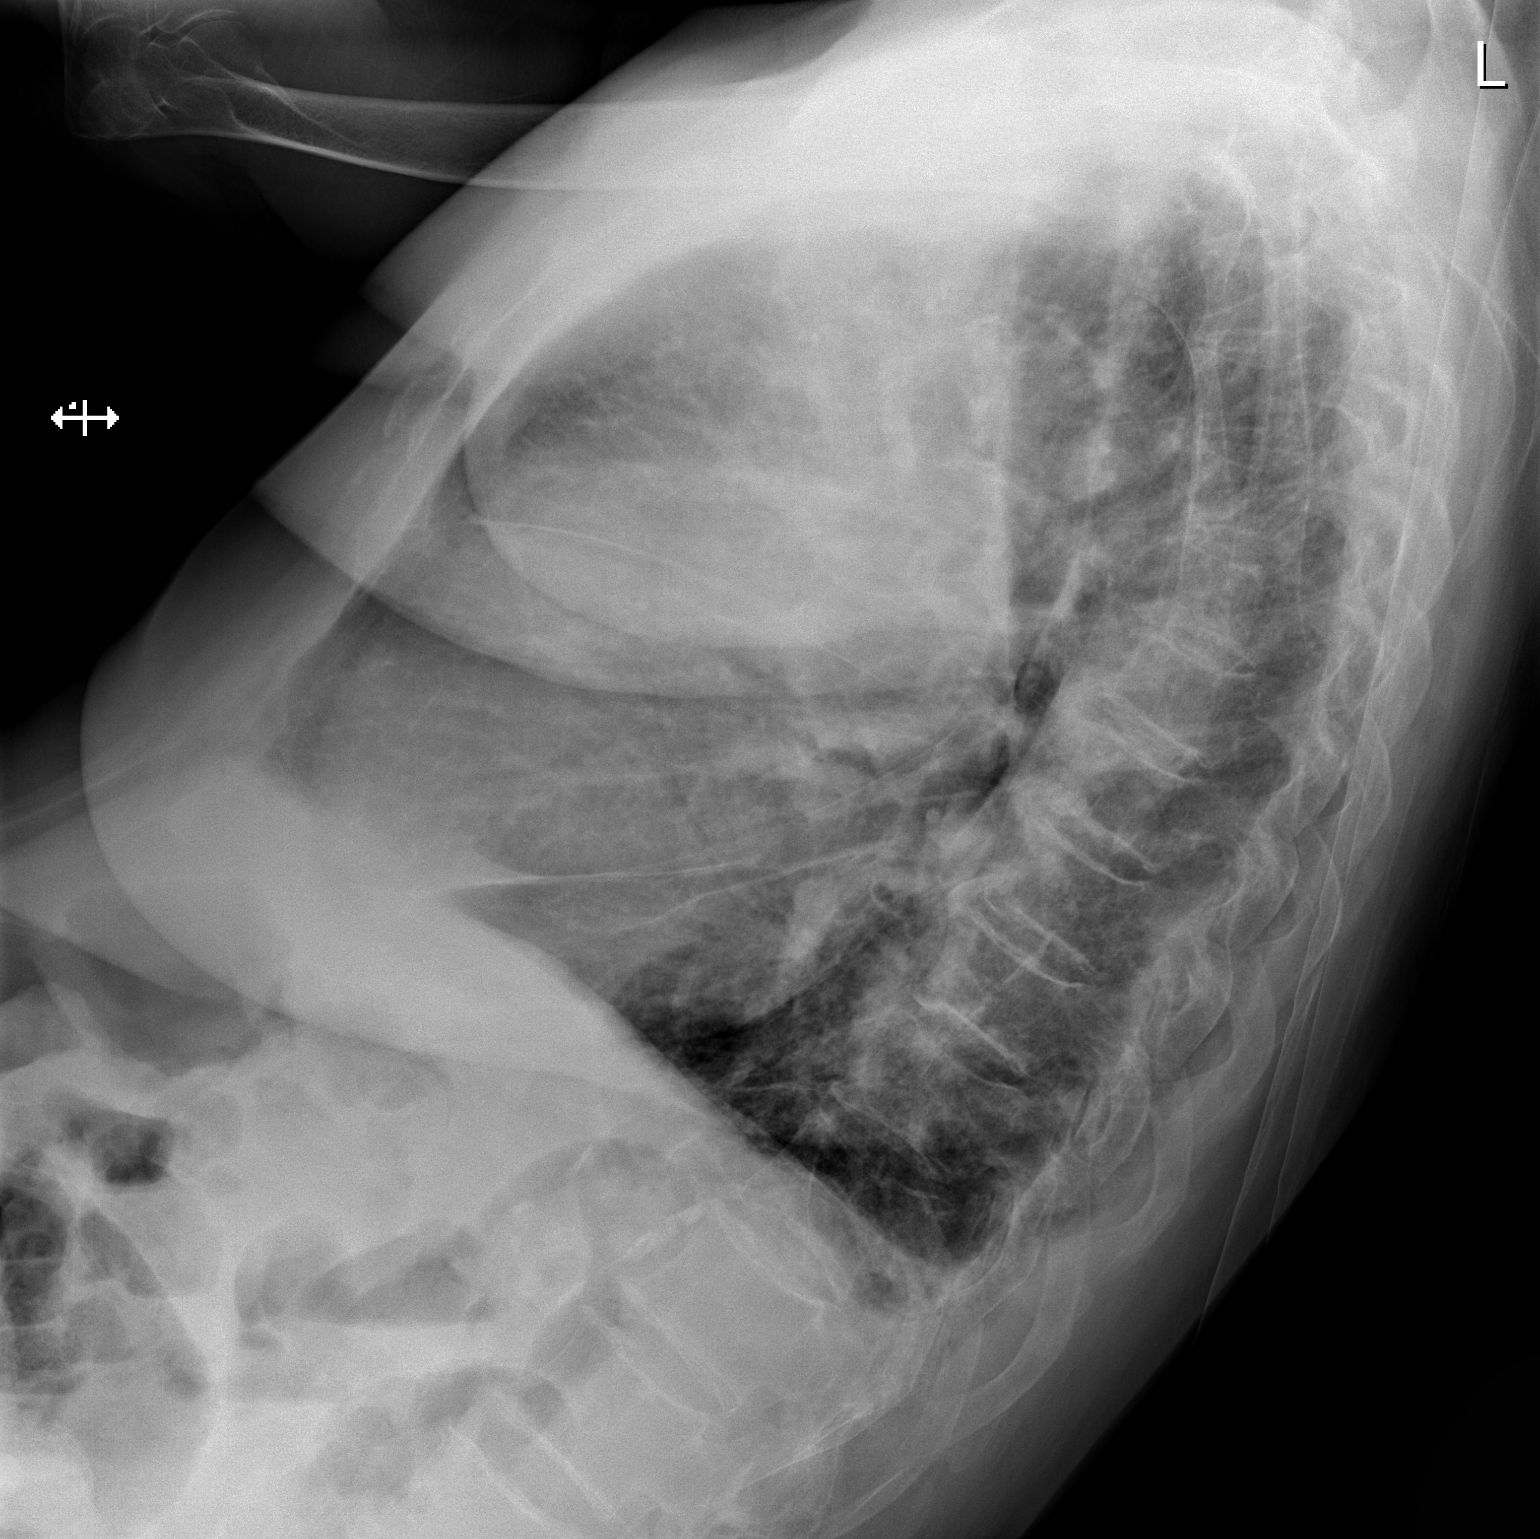

[x chest ap]
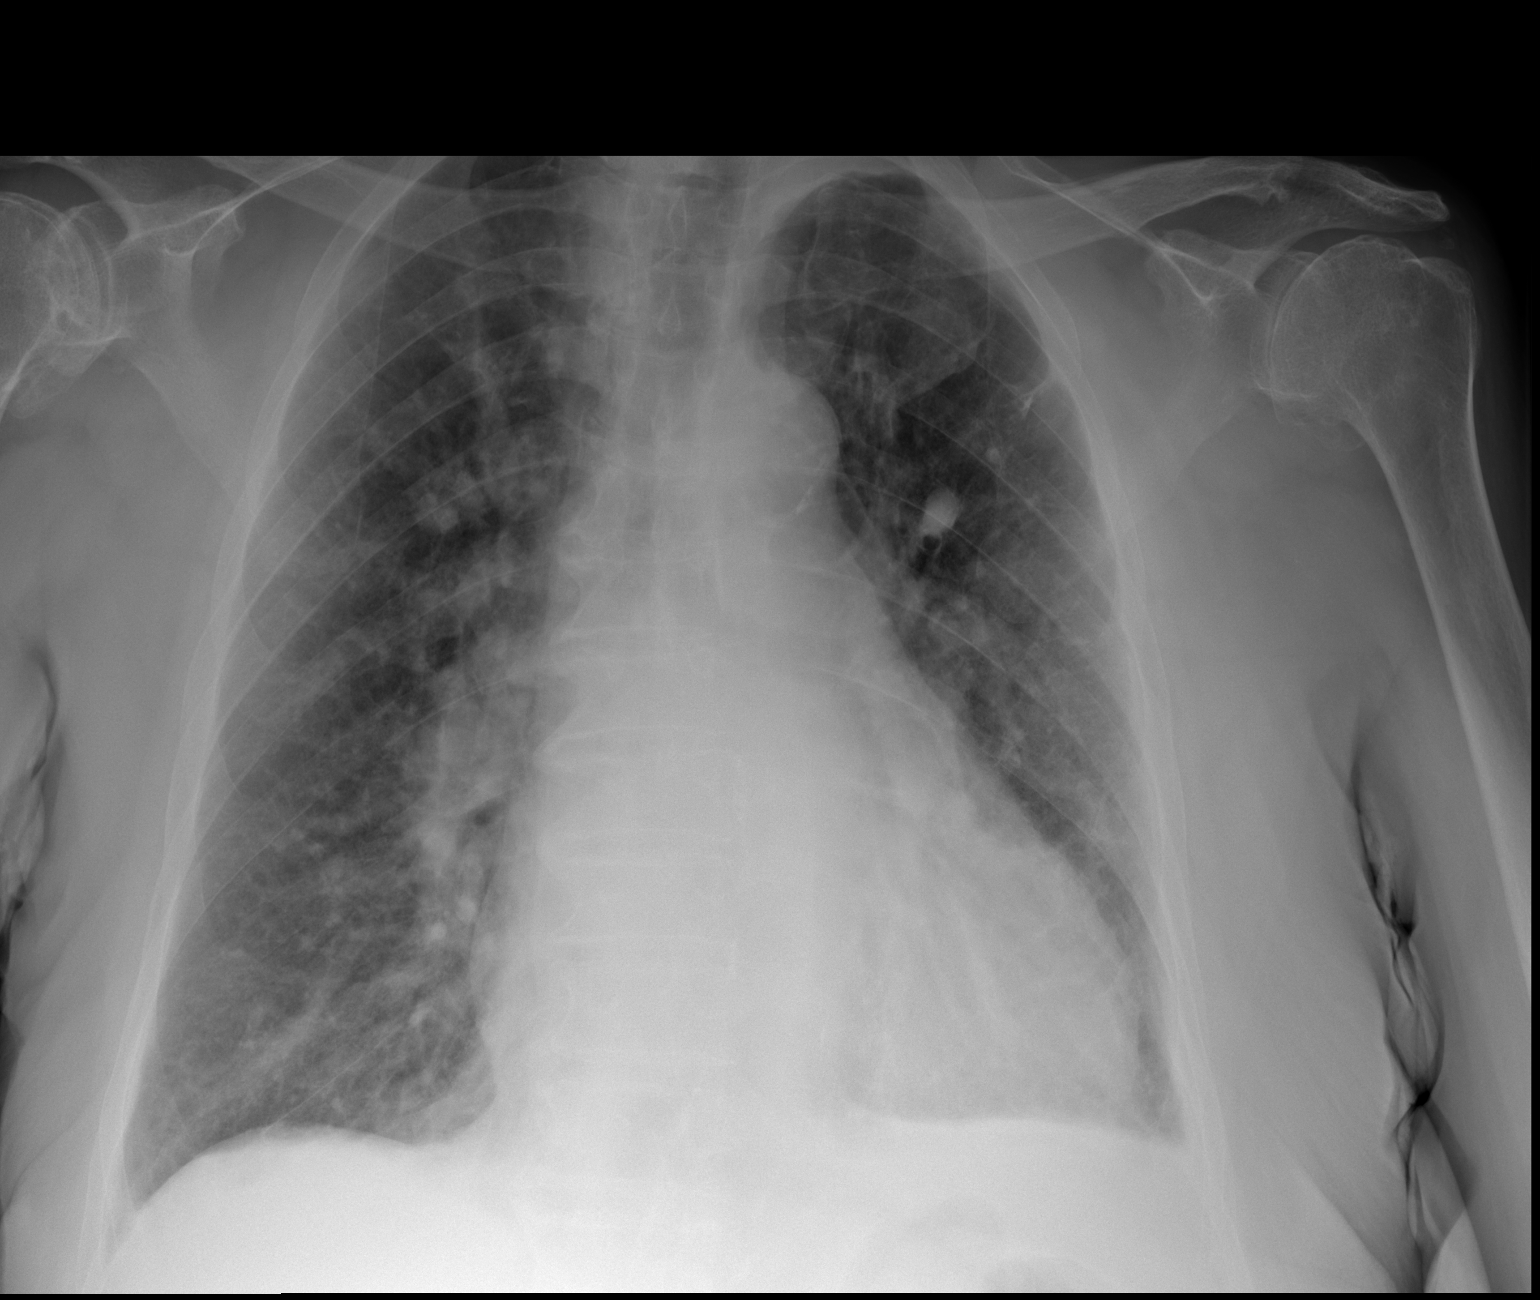

[2 of 2 positions shown; findings below may reference images not displayed]

FINDINGS: Cardiopericardial enlargement. There is fissural thickening and
cephalized blood flow. Borderline hyperinflation, chronic. Blunting
at the lateral left costophrenic sulcus is chronic. Advanced
glenohumeral osteoarthritis. No acute osseous finding.
IMPRESSION: Cardiomegaly and pulmonary vascular congestion.
